# Patient Record
Sex: Female | Born: 1935 | Race: White | Hispanic: No | Smoking: Never smoker
Health system: Southern US, Community
[De-identification: ages and names within clinical notes are randomized; demographics above are authoritative.]

## PROBLEM LIST (undated history)

## (undated) DIAGNOSIS — E785 Hyperlipidemia, unspecified: Secondary | ICD-10-CM

## (undated) DIAGNOSIS — B269 Mumps without complication: Secondary | ICD-10-CM

## (undated) DIAGNOSIS — S72002A Fracture of unspecified part of neck of left femur, initial encounter for closed fracture: Secondary | ICD-10-CM

## (undated) DIAGNOSIS — B019 Varicella without complication: Secondary | ICD-10-CM

## (undated) DIAGNOSIS — M1990 Unspecified osteoarthritis, unspecified site: Secondary | ICD-10-CM

## (undated) DIAGNOSIS — N189 Chronic kidney disease, unspecified: Secondary | ICD-10-CM

## (undated) DIAGNOSIS — B059 Measles without complication: Secondary | ICD-10-CM

## (undated) DIAGNOSIS — M810 Age-related osteoporosis without current pathological fracture: Secondary | ICD-10-CM

## (undated) DIAGNOSIS — I10 Essential (primary) hypertension: Secondary | ICD-10-CM

## (undated) DIAGNOSIS — T7840XA Allergy, unspecified, initial encounter: Secondary | ICD-10-CM

## (undated) DIAGNOSIS — L03213 Periorbital cellulitis: Secondary | ICD-10-CM

## (undated) DIAGNOSIS — E559 Vitamin D deficiency, unspecified: Secondary | ICD-10-CM

## (undated) HISTORY — PX: OTHER SURGICAL HISTORY: SHX169

## (undated) HISTORY — PX: APPENDECTOMY: SHX54

## (undated) HISTORY — DX: Unspecified osteoarthritis, unspecified site: M19.90

## (undated) HISTORY — DX: Hyperlipidemia, unspecified: E78.5

## (undated) HISTORY — DX: Chronic kidney disease, unspecified: N18.9

## (undated) HISTORY — PX: ABDOMINAL HYSTERECTOMY: SHX81

## (undated) HISTORY — DX: Allergy, unspecified, initial encounter: T78.40XA

## (undated) HISTORY — DX: Mumps without complication: B26.9

## (undated) HISTORY — PX: OVARIAN CYST REMOVAL: SHX89

## (undated) HISTORY — DX: Periorbital cellulitis: L03.213

## (undated) HISTORY — DX: Essential (primary) hypertension: I10

## (undated) HISTORY — DX: Vitamin D deficiency, unspecified: E55.9

## (undated) HISTORY — DX: Fracture of unspecified part of neck of left femur, initial encounter for closed fracture: S72.002A

## (undated) HISTORY — DX: Measles without complication: B05.9

## (undated) HISTORY — DX: Age-related osteoporosis without current pathological fracture: M81.0

## (undated) HISTORY — DX: Varicella without complication: B01.9

---

## 1990-03-07 LAB — HM PAP SMEAR: HM Pap smear: NORMAL

## 1999-07-07 LAB — HM MAMMOGRAPHY

## 2004-05-20 ENCOUNTER — Ambulatory Visit: Payer: Self-pay | Admitting: General Surgery

## 2004-05-20 ENCOUNTER — Other Ambulatory Visit: Payer: Self-pay

## 2004-05-25 ENCOUNTER — Ambulatory Visit: Payer: Self-pay | Admitting: General Surgery

## 2004-05-25 HISTORY — PX: OTHER SURGICAL HISTORY: SHX169

## 2004-05-27 ENCOUNTER — Ambulatory Visit: Payer: Self-pay | Admitting: General Surgery

## 2005-01-13 ENCOUNTER — Ambulatory Visit: Payer: Self-pay | Admitting: Family Medicine

## 2011-02-01 DIAGNOSIS — E559 Vitamin D deficiency, unspecified: Secondary | ICD-10-CM | POA: Insufficient documentation

## 2013-01-20 LAB — LIPID PANEL
BUN: 26
Creatine, Serum: 1.2
Potassium, serum: 5.4
Sodium, serum: 137
Vit D, 25-Hydroxy: 43.1

## 2013-07-14 LAB — LIPID PANEL
BUN: 24
Creatine, Serum: 1.11
GLUCOSE: 104
Potassium, serum: 4.7
Sodium, serum: 141
Vit D, 25-Hydroxy: 59.3

## 2014-02-17 ENCOUNTER — Ambulatory Visit: Payer: Self-pay | Admitting: Family Medicine

## 2014-02-17 DIAGNOSIS — M810 Age-related osteoporosis without current pathological fracture: Secondary | ICD-10-CM | POA: Insufficient documentation

## 2014-05-12 LAB — VITAMIN D 25 HYDROXY (VIT D DEFICIENCY, FRACTURES): Vit D, 25-Hydroxy: 25.2

## 2014-09-06 ENCOUNTER — Emergency Department: Admit: 2014-09-06 | Disposition: A | Discharge status: Home / Self Care | Payer: Self-pay | Admitting: Family Medicine

## 2014-09-06 DIAGNOSIS — I10 Essential (primary) hypertension: Secondary | ICD-10-CM | POA: Diagnosis not present

## 2014-09-06 DIAGNOSIS — L03211 Cellulitis of face: Secondary | ICD-10-CM | POA: Diagnosis not present

## 2014-09-06 DIAGNOSIS — L539 Erythematous condition, unspecified: Secondary | ICD-10-CM | POA: Diagnosis not present

## 2014-09-06 DIAGNOSIS — H05012 Cellulitis of left orbit: Secondary | ICD-10-CM | POA: Diagnosis not present

## 2014-09-06 LAB — COMPREHENSIVE METABOLIC PANEL
ALT: 13 U/L — AB
Albumin: 3.7 g/dL
Alkaline Phosphatase: 53 U/L
Anion Gap: 9 (ref 7–16)
BILIRUBIN TOTAL: 1.3 mg/dL — AB
BUN: 24 mg/dL — AB
CHLORIDE: 101 mmol/L
Calcium, Total: 8.8 mg/dL — ABNORMAL LOW
Co2: 24 mmol/L
Creatinine: 1.44 mg/dL — ABNORMAL HIGH
EGFR (African American): 37 — ABNORMAL LOW
GFR CALC NON AF AMER: 32 — AB
Glucose: 128 mg/dL — ABNORMAL HIGH
POTASSIUM: 3.8 mmol/L
SGOT(AST): 22 U/L
SODIUM: 134 mmol/L — AB
TOTAL PROTEIN: 6.8 g/dL

## 2014-09-06 LAB — CBC
HCT: 32.5 % — AB (ref 35.0–47.0)
HGB: 10.9 g/dL — AB (ref 12.0–16.0)
MCH: 33.9 pg (ref 26.0–34.0)
MCHC: 33.6 g/dL (ref 32.0–36.0)
MCV: 101 fL — ABNORMAL HIGH (ref 80–100)
Platelet: 92 10*3/uL — ABNORMAL LOW (ref 150–440)
RBC: 3.21 10*6/uL — AB (ref 3.80–5.20)
RDW: 12.3 % (ref 11.5–14.5)
WBC: 11.5 10*3/uL — ABNORMAL HIGH (ref 3.6–11.0)

## 2014-09-09 DIAGNOSIS — L03211 Cellulitis of face: Secondary | ICD-10-CM | POA: Diagnosis not present

## 2014-09-09 DIAGNOSIS — H578 Other specified disorders of eye and adnexa: Secondary | ICD-10-CM | POA: Diagnosis not present

## 2014-09-10 DIAGNOSIS — H04302 Unspecified dacryocystitis of left lacrimal passage: Secondary | ICD-10-CM | POA: Diagnosis not present

## 2014-09-28 DIAGNOSIS — H04302 Unspecified dacryocystitis of left lacrimal passage: Secondary | ICD-10-CM | POA: Diagnosis not present

## 2014-11-05 ENCOUNTER — Other Ambulatory Visit: Payer: Self-pay | Admitting: *Deleted

## 2014-11-05 MED ORDER — BENAZEPRIL HCL 40 MG PO TABS: 40.0000 mg | ORAL_TABLET | Freq: Every day | ORAL | Status: DC

## 2014-11-05 NOTE — Telephone Encounter (Signed)
Refill request for Benazepril HCL 40mg  1 tab qd Last filled by MD on- 09/09/14 #30 x12 refills Last Appt: 09/09/2014 Next Appt: none Please advise refill?

## 2015-01-04 ENCOUNTER — Encounter: Payer: Self-pay | Admitting: *Deleted

## 2015-01-04 ENCOUNTER — Encounter: Payer: Self-pay | Admitting: Family Medicine

## 2015-01-04 ENCOUNTER — Ambulatory Visit (INDEPENDENT_AMBULATORY_CARE_PROVIDER_SITE_OTHER): Payer: Medicare Other | Admitting: Family Medicine

## 2015-01-04 VITALS — BP 112/62 | HR 140 | Temp 98.3°F | Resp 16 | Wt 115.0 lb

## 2015-01-04 DIAGNOSIS — H5789 Other specified disorders of eye and adnexa: Secondary | ICD-10-CM | POA: Insufficient documentation

## 2015-01-04 DIAGNOSIS — I10 Essential (primary) hypertension: Secondary | ICD-10-CM | POA: Insufficient documentation

## 2015-01-04 DIAGNOSIS — N183 Chronic kidney disease, stage 3 unspecified: Secondary | ICD-10-CM | POA: Insufficient documentation

## 2015-01-04 DIAGNOSIS — L03213 Periorbital cellulitis: Secondary | ICD-10-CM

## 2015-01-04 DIAGNOSIS — I4891 Unspecified atrial fibrillation: Secondary | ICD-10-CM

## 2015-01-04 DIAGNOSIS — J309 Allergic rhinitis, unspecified: Secondary | ICD-10-CM | POA: Insufficient documentation

## 2015-01-04 DIAGNOSIS — K7689 Other specified diseases of liver: Secondary | ICD-10-CM | POA: Insufficient documentation

## 2015-01-04 DIAGNOSIS — E785 Hyperlipidemia, unspecified: Secondary | ICD-10-CM | POA: Insufficient documentation

## 2015-01-04 DIAGNOSIS — M1990 Unspecified osteoarthritis, unspecified site: Secondary | ICD-10-CM | POA: Insufficient documentation

## 2015-01-04 HISTORY — DX: Periorbital cellulitis: L03.213

## 2015-01-04 LAB — LIPID PANEL
BUN: 23
CREATINE, SERUM: 1.31
GLUCOSE: 104
Potassium, serum: 4
Sodium, serum: 137
VIT D 25 HYDROXY: 20.6

## 2015-01-04 NOTE — Progress Notes (Signed)
Patient: Tami [name] Female    DOB: May 25, 1924   81 y.o.   MRN: [id number] Visit Date: 01/04/2015  Today's Provider: Mila Merry, MD   Chief Complaint  Patient presents with  . Hypertension    follow up  . Abnormal Lab    Vitamin D deficiency follow up  . Osteoporosis    follow up   Subjective:    Hypertension Pertinent negatives include no chest pain or shortness of breath.    Hypertension, follow-up:  BP Readings from Last 3 Encounters:  01/04/15 112/62  09/09/14 144/70    She was last seen for hypertension 8 months ago.  BP at that visit was 138/60. Management changes since that visit include  none. She reports good compliance with treatment. She is not having side effects.  She is not exercising. She is adherent to low salt diet.   Outside blood pressures are  Not being checked. She is experiencing none.  Patient denies chest pain, chest pressure/discomfort, claudication, dyspnea, exertional chest pressure/discomfort, fatigue, irregular heart beat, lower extremity edema, near-syncope, orthopnea, palpitations, paroxysmal nocturnal dyspnea, syncope and tachypnea.   Cardiovascular risk factors include advanced age (older than 54 for men, 87 for women) and hypertension.  Use of agents associated with hypertension: NSAIDS.     Weight trend: stable Wt Readings from Last 3 Encounters:  01/04/15 115 lb (52.164 kg)  09/09/14 114 lb (51.71 kg)    Current diet: in general, a "healthy" diet      Osteoporosis Follow up: Last office visit was 8 months ago and not changes were made. Since last visit patient states she stopped taking Fosamax due to it causing worsening leg cramps. Patient states the leg cramps have improved and only occur occasional at night time while in bed.   Vitamin D Deficiency:  Last office visit was 8 months ago. No changes were made. Current treatment includes Vitamin D 50,000 units every week. Patient reports good compliance with  treatment and good tolerance.   ------------------------------------------------------------------------      Allergies  Allergen Reactions  . Maxzide  [Hydrochlorothiazide W-Triamterene]   . Sulfa Antibiotics    Previous Medications   ALENDRONATE (FOSAMAX) 70 MG TABLET    Take by mouth once a week.    AMLODIPINE (NORVASC) 10 MG TABLET    Take by mouth daily.    ASPIRIN 81 MG TABLET    Take 81 mg by mouth daily.    BENAZEPRIL (LOTENSIN) 40 MG TABLET    Take 1 tablet (40 mg total) by mouth daily.   CHOLECALCIFEROL (VITAMIN D3) 50000 UNITS CAPS    Take by mouth once a week.    LORATADINE (CLARITIN) 10 MG TABLET    Take by mouth daily.    RANITIDINE (ZANTAC) 150 MG TABLET    Take by mouth daily.     Review of Systems  Constitutional: Negative for fever, chills and fatigue.  HENT: Negative for ear pain and facial swelling.   Respiratory: Negative for chest tightness, shortness of breath and wheezing.   Cardiovascular: Negative for chest pain and leg swelling.  Musculoskeletal: Positive for myalgias (leg cramps occasionally). Negative for joint swelling.    History  Substance Use Topics  . Smoking status: Never Smoker   . Smokeless tobacco: Not on file  . Alcohol Use: No   Objective:   BP 112/62 mmHg  Pulse 140  Temp(Src) 98.3 F (36.8 C) (Oral)  Resp 16  Wt 115 lb (52.164 kg)  SpO2 97%  Physical Exam  General Appearance:    Alert, cooperative, no distress  Eyes:    PERRL, conjunctiva/corneas clear, EOM's intact       Lungs:     Clear to auscultation bilaterally, respirations unlabored  Heart:    Irregular rate and rhythm, tachycardic  Neurologic:   Awake, alert, oriented x 3. No apparent focal neurological           defect.            Assessment & Plan:     1. Essential hypertension well controlled Continue current medications.   - EKG 12-Lead  2. Atrial fibrillation, unspecified Incidental finding upon presentation today. Asymptomatic. No findings  suggestive of CHF. Currently on ASA. Check labs and echocardiogram. Consider anticoagulant.  - T4 AND TSH - Comprehensive metabolic panel - CBC - Echocardiogram.       Mila Merry, MD  [facility]

## 2015-01-05 LAB — COMPREHENSIVE METABOLIC PANEL
ALT: 32 IU/L (ref 0–32)
AST: 29 IU/L (ref 0–40)
Albumin/Globulin Ratio: 1.7 (ref 1.1–2.5)
Albumin: 4.3 g/dL (ref 3.5–4.7)
Alkaline Phosphatase: 78 IU/L (ref 39–117)
BUN/Creatinine Ratio: 16 (ref 11–26)
BUN: 20 mg/dL (ref 8–27)
Bilirubin Total: 0.3 mg/dL (ref 0.0–1.2)
CO2: 23 mmol/L (ref 18–29)
Calcium: 9.1 mg/dL (ref 8.7–10.3)
Chloride: 103 mmol/L (ref 97–108)
Creatinine, Ser: 1.24 mg/dL — ABNORMAL HIGH (ref 0.57–1.00)
GFR calc non Af Amer: 39 mL/min/{1.73_m2} — ABNORMAL LOW (ref >59)
GFR, EST AFRICAN AMERICAN: 45 mL/min/{1.73_m2} — AB (ref >59)
GLUCOSE: 116 mg/dL — AB (ref 65–99)
Globulin, Total: 2.6 g/dL (ref 1.5–4.5)
Potassium: 5.5 mmol/L — ABNORMAL HIGH (ref 3.5–5.2)
SODIUM: 142 mmol/L (ref 134–144)
Total Protein: 6.9 g/dL (ref 6.0–8.5)

## 2015-01-05 LAB — CBC
Hematocrit: 32.2 % — ABNORMAL LOW (ref 34.0–46.6)
Hemoglobin: 10.8 g/dL — ABNORMAL LOW (ref 11.1–15.9)
MCH: 34.1 pg — ABNORMAL HIGH (ref 26.6–33.0)
MCHC: 33.5 g/dL (ref 31.5–35.7)
MCV: 102 fL — AB (ref 79–97)
Platelets: 155 10*3/uL (ref 150–379)
RBC: 3.17 x10E6/uL — ABNORMAL LOW (ref 3.77–5.28)
RDW: 13 % (ref 12.3–15.4)
WBC: 6.3 10*3/uL (ref 3.4–10.8)

## 2015-01-05 LAB — T4 AND TSH
T4, Total: 6.2 ug/dL (ref 4.5–12.0)
TSH: 8.88 u[IU]/mL — AB (ref 0.450–4.500)

## 2015-01-06 ENCOUNTER — Telehealth: Payer: Self-pay | Admitting: Family Medicine

## 2015-01-06 ENCOUNTER — Encounter: Payer: Self-pay | Admitting: Family Medicine

## 2015-01-06 DIAGNOSIS — I4891 Unspecified atrial fibrillation: Secondary | ICD-10-CM | POA: Insufficient documentation

## 2015-01-06 DIAGNOSIS — D539 Nutritional anemia, unspecified: Secondary | ICD-10-CM | POA: Insufficient documentation

## 2015-01-06 LAB — SPECIMEN STATUS REPORT

## 2015-01-06 LAB — B12 AND FOLATE PANEL
FOLATE: 18 ng/mL (ref >3.0)
VITAMIN B 12: 226 pg/mL (ref 211–946)

## 2015-01-06 LAB — FERRITIN: Ferritin: 129 ng/mL (ref 15–150)

## 2015-01-06 NOTE — Telephone Encounter (Signed)
I don't know how to order an echo in Epic. I have to fill in question about whether to use enhancing agent and i have know idea what that means for an echocardiogram. Can you check with cardiopulmonary to see how it is supposed to be ordered.

## 2015-01-06 NOTE — Telephone Encounter (Signed)
Per Dorene Sorrow echo tech at [facility] you should answer yes to using enhancing agent

## 2015-01-06 NOTE — Telephone Encounter (Signed)
You had sent a message to order an echo for pt.I am unable to order any test without referral put in Epic

## 2015-01-06 NOTE — Telephone Encounter (Signed)
Please schedule echo. Order is in Epic.

## 2015-01-07 ENCOUNTER — Other Ambulatory Visit: Payer: Self-pay | Admitting: Family Medicine

## 2015-01-07 ENCOUNTER — Telehealth: Payer: Self-pay | Admitting: Family Medicine

## 2015-01-07 DIAGNOSIS — I4891 Unspecified atrial fibrillation: Secondary | ICD-10-CM

## 2015-01-07 NOTE — Telephone Encounter (Signed)
Auth # [id number] expires 02-21-15 for 2 D echo to be done at [facility]

## 2015-01-07 NOTE — Telephone Encounter (Signed)
That's the one I ordered. I'll order it again.

## 2015-01-07 NOTE — Telephone Encounter (Signed)
The echo you ordered is not for the 2D echo.It should read echo complete or [id number]

## 2015-01-08 ENCOUNTER — Encounter: Payer: Self-pay | Admitting: Family Medicine

## 2015-01-12 ENCOUNTER — Telehealth: Payer: Self-pay

## 2015-01-12 ENCOUNTER — Ambulatory Visit
Admission: RE | Admit: 2015-01-12 | Discharge: 2015-01-12 | Disposition: A | Discharge status: Home / Self Care | Payer: Medicare Other | Source: Ambulatory Visit | Attending: Family Medicine | Admitting: Family Medicine

## 2015-01-12 DIAGNOSIS — I340 Nonrheumatic mitral (valve) insufficiency: Secondary | ICD-10-CM | POA: Diagnosis not present

## 2015-01-12 DIAGNOSIS — I351 Nonrheumatic aortic (valve) insufficiency: Secondary | ICD-10-CM | POA: Diagnosis not present

## 2015-01-12 DIAGNOSIS — I4891 Unspecified atrial fibrillation: Secondary | ICD-10-CM | POA: Insufficient documentation

## 2015-01-12 DIAGNOSIS — I071 Rheumatic tricuspid insufficiency: Secondary | ICD-10-CM | POA: Diagnosis not present

## 2015-01-12 NOTE — Telephone Encounter (Signed)
-----   Message from Malva Limes, MD sent at 01/12/2015  1:56 PM EDT ----- Echocardiogram shows that her heart rhythm is still irregular (atrial fibrillation), but is functioning well otherwise. She needs to schedule o.v. In the next 7-10 days to discuss medications to control her heart rhythm.

## 2015-01-12 NOTE — Telephone Encounter (Signed)
Advised pt's daugther, Margrett Rud, as described below, she verbalized fully understanding; the phone call was transferred to the front desk to make a f/u appointment for the next 7-10 days.

## 2015-01-12 NOTE — Progress Notes (Signed)
*  PRELIMINARY RESULTS* Echocardiogram 2D Echocardiogram has been performed.  Georgann Housekeeper Hege 01/12/2015, 11:20 AM

## 2015-01-20 ENCOUNTER — Ambulatory Visit (INDEPENDENT_AMBULATORY_CARE_PROVIDER_SITE_OTHER): Payer: Medicare Other | Admitting: Family Medicine

## 2015-01-20 ENCOUNTER — Encounter: Payer: Self-pay | Admitting: Family Medicine

## 2015-01-20 VITALS — BP 118/64 | HR 108 | Temp 98.1°F | Resp 18 | Wt 113.0 lb

## 2015-01-20 DIAGNOSIS — N183 Chronic kidney disease, stage 3 unspecified: Secondary | ICD-10-CM

## 2015-01-20 DIAGNOSIS — E559 Vitamin D deficiency, unspecified: Secondary | ICD-10-CM

## 2015-01-20 DIAGNOSIS — I482 Chronic atrial fibrillation, unspecified: Secondary | ICD-10-CM

## 2015-01-20 MED ORDER — RIVAROXABAN 15 MG PO TABS: 15.0000 mg | ORAL_TABLET | Freq: Every day | ORAL | Status: DC

## 2015-01-20 MED ORDER — VERAPAMIL HCL ER 120 MG PO TBCR: 120.0000 mg | EXTENDED_RELEASE_TABLET | Freq: Every day | ORAL | Status: DC

## 2015-01-20 MED ORDER — VITAMIN D3 50 MCG (2000 UT) PO CHEW: 1.0000 | CHEWABLE_TABLET | Freq: Every day | ORAL | Status: AC

## 2015-01-20 NOTE — Progress Notes (Signed)
Patient: Tami [name] Female    DOB: 1923/11/27   81 y.o.   MRN: [id number] Visit Date: 01/20/2015  Today's Provider: Mila Merry, MD   Chief Complaint  Patient presents with  . Follow-up  . Results    2D echo   Subjective:    HPI  Patient was found incidentally to be in a-fib at routine visit 01-04-15. Follow-up for results to 2D echo from 01/12/2015. Echocardiogram shows that her heart rhythm is still irregular (atrial fibrillation), with normal systolic function and mild to moderate  AR/TR/MR. She does complain of heart racing with palpitations at night, but is feeling well otherwise.     Allergies  Allergen Reactions  . Maxzide  [Hydrochlorothiazide W-Triamterene]   . Sulfa Antibiotics    Previous Medications   ALENDRONATE (FOSAMAX) 70 MG TABLET    Take by mouth once a week.    AMLODIPINE (NORVASC) 10 MG TABLET    Take by mouth daily.    ASPIRIN 81 MG TABLET    Take 81 mg by mouth daily.    BENAZEPRIL (LOTENSIN) 40 MG TABLET    Take 1 tablet (40 mg total) by mouth daily.   CHOLECALCIFEROL (VITAMIN D3) 50000 UNITS CAPS    Take by mouth once a week.    LORATADINE (CLARITIN) 10 MG TABLET    Take by mouth daily.    MULTIPLE VITAMINS-MINERALS (PRESERVISION AREDS 2) CAPS    Take 1 capsule by mouth 2 (two) times daily.   RANITIDINE (ZANTAC) 150 MG TABLET    Take by mouth daily.     Review of Systems  Respiratory: Negative for shortness of breath.   Cardiovascular: Positive for palpitations. Negative for chest pain.  Neurological: Negative for dizziness, light-headedness and headaches.  Psychiatric/Behavioral: Negative for sleep disturbance.    Social History  Substance Use Topics  . Smoking status: Never Smoker   . Smokeless tobacco: Not on file  . Alcohol Use: No   Objective:   BP 118/64 mmHg  Pulse 108  Temp(Src) 98.1 F (36.7 C) (Oral)  Resp 18  Wt 113 lb (51.256 kg)  SpO2 92%  Physical Exam  General Appearance:    Alert, cooperative, no distress    Eyes:    PERRL, conjunctiva/corneas clear, EOM's intact       Lungs:     Clear to auscultation bilaterally, respirations unlabored  Heart:    Irregularly irregular rhythm  Neurologic:   Awake, alert, oriented x 3. No apparent focal neurological           defect.            Assessment & Plan:     1. Chronic atrial fibrillation New onset. Counseled on risk/benefit of anticoagulation and options of warfarin versus novel anticoagulants. Will stop ASA and start Xarelto. - Rivaroxaban (XARELTO) 15 MG TABS tablet; Take 1 tablet (15 mg total) by mouth daily with supper.  Dispense: 30 tablet; Refill: 3  Will stop amlodipine for BP in favor of verapamil to control heart rate.  - verapamil (CALAN-SR) 120 MG CR tablet; Take 1 tablet (120 mg total) by mouth daily.  Dispense: 30 tablet; Refill: 3  2. Vitamin D deficiency She would like to take lower daily dose of D3 instead of high weekly dose. Will recheck levels after a few months.  - Cholecalciferol (VITAMIN D3) 2000 UNITS 1 tablet by mouth daily.    3. Chronic kidney disease, stage 3 Stable.  Lelon Huh, MD  [facility]

## 2015-01-20 NOTE — Patient Instructions (Signed)
Stop amlodipine and start taking Verapamil instead. This should help regular heart beat Stop aspirin and start taking Xarelto15mg  a day to prevent blood clots in heart.

## 2015-02-10 ENCOUNTER — Ambulatory Visit (INDEPENDENT_AMBULATORY_CARE_PROVIDER_SITE_OTHER): Payer: Medicare Other | Admitting: Family Medicine

## 2015-02-10 ENCOUNTER — Encounter: Payer: Self-pay | Admitting: Family Medicine

## 2015-02-10 VITALS — BP 120/80 | HR 110 | Temp 98.2°F | Resp 16 | Wt 110.0 lb

## 2015-02-10 DIAGNOSIS — I482 Chronic atrial fibrillation, unspecified: Secondary | ICD-10-CM

## 2015-02-10 DIAGNOSIS — Z23 Encounter for immunization: Secondary | ICD-10-CM

## 2015-02-10 NOTE — Progress Notes (Signed)
Patient: Tami [name] Female    DOB: 1924/01/23   81 y.o.   MRN: [id number] Visit Date: 02/10/2015  Today's Provider: Mila Merry, MD   Chief Complaint  Patient presents with  . Hypertension    follow up  . Atrial Fibrillation    follow up   Subjective:    HPI  Hypertension, follow-up:  BP Readings from Last 3 Encounters:  01/20/15 118/64  01/04/15 112/62  09/09/14 144/70    She was last seen for hypertension 3 weeks ago.  BP at that visit was  118/64. Management since that visit includes  Stopping  Amlodipine for blood pressure in favor of Verapamil to control heart rate. She reports good compliance with treatment. She is not having side effects.  She is exercising. She is  adherent to low salt diet.   Outside blood pressures are not being checked. She is experiencing none.  Patient denies chest pain, chest pressure/discomfort, claudication, dyspnea, exertional chest pressure/discomfort, fatigue, irregular heart beat, lower extremity edema, near-syncope, orthopnea, palpitations, paroxysmal nocturnal dyspnea, syncope and tachypnea.   Cardiovascular risk factors include advanced age (older than 59 for men, 34 for women) and hypertension.  Use of agents associated with hypertension: none.     Weight trend: stable Wt Readings from Last 3 Encounters:  01/20/15 113 lb (51.256 kg)  01/04/15 115 lb (52.164 kg)  09/09/14 114 lb (51.71 kg)    Current diet: in general, a "healthy" diet    ------------------------------------------------------------------------  Follow up Atrial Fibrillation: Last office visit was 3 weeks ago. Changes made include stopping Aspirin and starting Xarelto. Patient reports good compliance and good tolerance with treatment.     Allergies  Allergen Reactions  . Maxzide  [Hydrochlorothiazide W-Triamterene]   . Sulfa Antibiotics    Previous Medications   ALENDRONATE (FOSAMAX) 70 MG TABLET    Take by mouth once a week.    BENAZEPRIL (LOTENSIN) 40 MG TABLET    Take 1 tablet (40 mg total) by mouth daily.   CHOLECALCIFEROL (VITAMIN D3) 2000 UNITS CHEW    Chew 1 tablet by mouth daily.   LORATADINE (CLARITIN) 10 MG TABLET    Take by mouth daily.    MULTIPLE VITAMINS-MINERALS (PRESERVISION AREDS 2) CAPS    Take 1 capsule by mouth 2 (two) times daily.   RANITIDINE (ZANTAC) 150 MG TABLET    Take by mouth daily.    RIVAROXABAN (XARELTO) 15 MG TABS TABLET    Take 1 tablet (15 mg total) by mouth daily with supper.   VERAPAMIL (CALAN-SR) 120 MG CR TABLET    Take 1 tablet (120 mg total) by mouth daily.    Review of Systems  Constitutional: Negative for fever, chills, appetite change and fatigue.  Respiratory: Negative for chest tightness and shortness of breath.   Cardiovascular: Negative for chest pain and palpitations.  Gastrointestinal: Negative for nausea, vomiting and abdominal pain.  Neurological: Negative for dizziness and weakness.    Social History  Substance Use Topics  . Smoking status: Never Smoker   . Smokeless tobacco: Not on file  . Alcohol Use: No   Objective:   BP 120/80 mmHg  Pulse 110  Temp(Src) 98.2 F (36.8 C) (Oral)  Resp 16  Wt 110 lb (49.896 kg)  SpO2 98%  Physical Exam  General Appearance:    Alert, cooperative, no distress, obese  Eyes:    PERRL, conjunctiva/corneas clear, EOM's intact       Lungs:  Clear to auscultation bilaterally, respirations unlabored  Heart:    Rhythm: irregularly irregular and rapid rate Murmur(s)-  none  Neurologic:   Awake, alert, oriented x 3. No apparent focal neurological           defect.            Assessment & Plan:     1. Chronic atrial fibrillation Doing well on verapamil with fewer palpitations and heart rate nearly down to normal. She just has medication refilled. Will continue 120 for now and recheck next month. Consider increasing verapamil and reducing benazepril if HR still over 100 at follow up.   2. Need for influenza  vaccination  - Flu vaccine HIGH DOSE PF       Mila Merry, MD  [facility]

## 2015-03-01 ENCOUNTER — Encounter: Payer: Self-pay | Admitting: Family Medicine

## 2015-03-24 ENCOUNTER — Encounter: Payer: Self-pay | Admitting: Family Medicine

## 2015-03-24 ENCOUNTER — Other Ambulatory Visit: Payer: Self-pay | Admitting: Family Medicine

## 2015-03-24 ENCOUNTER — Ambulatory Visit (INDEPENDENT_AMBULATORY_CARE_PROVIDER_SITE_OTHER): Payer: Medicare Other | Admitting: Family Medicine

## 2015-03-24 VITALS — BP 120/80 | HR 116 | Temp 98.1°F | Resp 16 | Ht 62.0 in | Wt 107.0 lb

## 2015-03-24 DIAGNOSIS — I482 Chronic atrial fibrillation, unspecified: Secondary | ICD-10-CM

## 2015-03-24 DIAGNOSIS — I10 Essential (primary) hypertension: Secondary | ICD-10-CM

## 2015-03-24 DIAGNOSIS — R000 Tachycardia, unspecified: Secondary | ICD-10-CM

## 2015-03-24 MED ORDER — BENAZEPRIL HCL 20 MG PO TABS: 40.0000 mg | ORAL_TABLET | Freq: Every day | ORAL | Status: DC

## 2015-03-24 MED ORDER — VERAPAMIL HCL ER 180 MG PO TBCR: 180.0000 mg | EXTENDED_RELEASE_TABLET | Freq: Every day | ORAL | Status: DC

## 2015-03-24 NOTE — Progress Notes (Addendum)
Patient: Tami [name] Female    DOB: Mar 28, 1924   81 y.o.   MRN: [id number] Visit Date: 03/24/2015  Today's Provider: Mila Merryonald Ranisha Allaire, MD   Chief Complaint  Patient presents with  . Atrial Fibrillation    follow up  . Memory Loss   Subjective:    Atrial Fibrillation Presents for follow-up visit. Symptoms are negative for chest pain, dizziness, palpitations, shortness of breath, syncope, tachycardia and weakness. Past medical history includes atrial fibrillation.   Last office visit was 02/10/2015. No changes were made. Patient was advised to continue Verapamil 120mg . Patient was to follow up in 1 month. Would consider increasing Verapamil and reducing Benazepril if heart rate was still over 100 at follow up visit.   Memory Impairment: Patients daughter comes in today with patient reporting that over the past several years she has noticed that patients memory is worsening. Patient is forgetting how to do simple task that she has done for several years like making instant potatoes. She also has repetitive behavior where she  Picks up an item, then places it back down and repeats the cycle over and over.     Allergies  Allergen Reactions  . Maxzide  [Hydrochlorothiazide W-Triamterene]   . Sulfa Antibiotics    Previous Medications   ALENDRONATE (FOSAMAX) 70 MG TABLET    Take by mouth once a week.    BENAZEPRIL (LOTENSIN) 40 MG TABLET    Take 1 tablet (40 mg total) by mouth daily.   CHOLECALCIFEROL (VITAMIN D3) 2000 UNITS CHEW    Chew 1 tablet by mouth daily.   DOCUSATE SODIUM (COLACE) 100 MG CAPSULE    Take 100 mg by mouth daily.   LORATADINE (CLARITIN) 10 MG TABLET    Take by mouth daily.    MULTIPLE VITAMINS-MINERALS (PRESERVISION AREDS 2) CAPS    Take 1 capsule by mouth 2 (two) times daily.   RANITIDINE (ZANTAC) 150 MG TABLET    Take by mouth daily.    RIVAROXABAN (XARELTO) 15 MG TABS TABLET    Take 1 tablet (15 mg total) by mouth daily with supper.   VERAPAMIL (CALAN-SR)  120 MG CR TABLET    Take 1 tablet (120 mg total) by mouth daily.    Review of Systems  Constitutional: Negative for fever, chills, appetite change and fatigue.  Respiratory: Negative for chest tightness and shortness of breath.   Cardiovascular: Negative for chest pain, palpitations and syncope.  Gastrointestinal: Negative for nausea, vomiting and abdominal pain.  Neurological: Negative for dizziness and weakness.  Psychiatric/Behavioral: Positive for confusion (forgetful ). Negative for suicidal ideas, hallucinations, behavioral problems, sleep disturbance, self-injury, dysphoric mood and agitation. The patient is nervous/anxious. The patient is not hyperactive.     Social History  Substance Use Topics  . Smoking status: Never Smoker   . Smokeless tobacco: Not on file  . Alcohol Use: No   Objective:   BP 120/80 mmHg  Pulse 116  Temp(Src) 98.1 F (36.7 C) (Oral)  Resp 16  Ht 5\' 2"  (1.575 m)  Wt 107 lb (48.535 kg)  BMI 19.57 kg/m2  SpO2 98%  Physical Exam   General Appearance:    Alert, cooperative, no distress  Eyes:    PERRL, conjunctiva/corneas clear, EOM's intact       Lungs:     Clear to auscultation bilaterally, respirations unlabored  Heart:    Tachycardic, irregularly irregular  Neurologic:   Awake, alert, oriented x 3. No apparent focal neurological  defect.      Cognitive Testing - 6-CIT  Correct? Score   What year is it? yes 0 0 or 4  What month is it? yes 0 0 or 3  Memorize:    Floyde Parkins,  44,  [address],  [place],      What time is it? (within 1 hour) yes 0 0 or 3  Count backwards from 20 yes 0 0, 2, or 4  Name the months of the year yes 0 0, 2, or 4  Repeat name & address above no 3 0, 2, 4, 6, 8, or 10       TOTAL SCORE  3/28   Interpretation:  Normal  Normal (0-7) Abnormal (8-28)        Assessment & Plan:     1. Chronic atrial fibrillation (HCC) Rate still uncontrolled. Continue Xarelta and increase verapamil - verapamil (CALAN-SR)  180 MG CR tablet; Take 1 tablet (180 mg total) by mouth daily.  Dispense: 30 tablet; Refill: 2  2. Essential hypertension Reduce benazepril to  since we are increasing verapamil  3. Tachycardia Increase verapamil from 120 to 180 as above.    Return in about 2 months (around 05/24/2015).        Mila Merry, MD  [facility]

## 2015-04-09 ENCOUNTER — Telehealth: Payer: Self-pay | Admitting: Family Medicine

## 2015-04-09 MED ORDER — BENAZEPRIL HCL 20 MG PO TABS: 20.0000 mg | ORAL_TABLET | Freq: Every day | ORAL | Status: DC

## 2015-04-09 NOTE — Telephone Encounter (Signed)
[name] was notified. Expressed understanding.

## 2015-04-09 NOTE — Telephone Encounter (Signed)
She is right, only supposed to be one tablet a day. I think the computer automatically changed it to 2 when I changed from the 40mg  tablet to a 20mg  tablet. Please call pharmacy and have them change the directions for the next refill. Thanks.

## 2015-04-09 NOTE — Telephone Encounter (Signed)
Pt daughter, Okey DupreRose called to verify the dosage for Rx benazepril (LOTENSIN) 20 MG tablet.  Pt daughter states she thought pt should only take 1 a day but Rx states 2 day.  JW#[phone]/AOCB#[id number]/MW

## 2015-04-09 NOTE — Telephone Encounter (Signed)
Please advise 

## 2015-04-30 ENCOUNTER — Other Ambulatory Visit: Payer: Self-pay | Admitting: Family Medicine

## 2015-05-24 ENCOUNTER — Encounter: Payer: Self-pay | Admitting: Family Medicine

## 2015-05-24 ENCOUNTER — Ambulatory Visit (INDEPENDENT_AMBULATORY_CARE_PROVIDER_SITE_OTHER): Payer: Medicare Other | Admitting: Family Medicine

## 2015-05-24 VITALS — BP 124/74 | HR 126 | Temp 97.9°F | Resp 16 | Wt 111.0 lb

## 2015-05-24 DIAGNOSIS — I10 Essential (primary) hypertension: Secondary | ICD-10-CM

## 2015-05-24 DIAGNOSIS — I482 Chronic atrial fibrillation, unspecified: Secondary | ICD-10-CM

## 2015-05-24 DIAGNOSIS — R000 Tachycardia, unspecified: Secondary | ICD-10-CM

## 2015-05-24 MED ORDER — BENAZEPRIL HCL 10 MG PO TABS: 10.0000 mg | ORAL_TABLET | Freq: Every day | ORAL | Status: DC

## 2015-05-24 MED ORDER — VERAPAMIL HCL ER 240 MG PO TBCR: 240.0000 mg | EXTENDED_RELEASE_TABLET | Freq: Every day | ORAL | Status: DC

## 2015-05-24 NOTE — Progress Notes (Signed)
Patient: Tami [name] Female    DOB: 01/06/1924   81 y.o.   MRN: [id number] Visit Date: 05/24/2015  Today's Provider: Mila Merry, MD   Chief Complaint  Patient presents with  . Follow-up   Subjective:    HPI  Follow-up for A-Fib and tachycardia from 03/24/2015; well controlled, increased verapamil from 120 mg to 180 mg qd.    Hypertension, follow-up:  BP Readings from Last 3 Encounters:  05/24/15 124/74  03/24/15 120/80  02/10/15 120/80    She was last seen for hypertension 2 months ago.  BP at that visit was 120/80. Management since that visit includes; reduced benazepril 40 mg  to 20 mg qd. She reports good compliance with treatment. She is not having side effects. none  She is exercising. She is adherent to low salt diet.   Outside blood pressures are n/a. She is experiencing none.  Patient denies palpitations.   Cardiovascular risk factors include hypertension.  Use of agents associated with hypertension: none.     Weight trend: stable Wt Readings from Last 3 Encounters:  05/24/15 111 lb (50.349 kg)  03/24/15 107 lb (48.535 kg)  02/10/15 110 lb (49.896 kg)    Current diet: well balanced  -----------------------------------------------------------------------    Allergies  Allergen Reactions  . Maxzide  [Hydrochlorothiazide W-Triamterene]   . Sulfa Antibiotics    Previous Medications   ALENDRONATE (FOSAMAX) 70 MG TABLET    Take by mouth once a week.    BENAZEPRIL (LOTENSIN) 20 MG TABLET    Take 1 tablet (20 mg total) by mouth daily. Please cancel refills for the  tablet   CHOLECALCIFEROL (VITAMIN D3) 2000 UNITS CHEW    Chew 1 tablet by mouth daily.   DOCUSATE SODIUM (COLACE) 100 MG CAPSULE    Take 100 mg by mouth daily.   LORATADINE (CLARITIN) 10 MG TABLET    Take by mouth daily.    MULTIPLE VITAMINS-MINERALS (PRESERVISION AREDS 2) CAPS    Take 1 capsule by mouth 2 (two) times daily.   RANITIDINE (ZANTAC) 150 MG TABLET    Take by  mouth daily.    VERAPAMIL (CALAN-SR) 180 MG CR TABLET    Take 1 tablet (180 mg total) by mouth daily.   XARELTO 15 MG TABS TABLET    TAKE ONE (1) TABLET BY MOUTH EVERY DAY WITH SUPPER    Review of Systems  Constitutional: Negative for fever, chills, appetite change and fatigue.  Respiratory: Negative for chest tightness and shortness of breath.   Cardiovascular: Positive for palpitations. Negative for chest pain.  Gastrointestinal: Negative for nausea, vomiting and abdominal pain.  Neurological: Negative for dizziness, weakness and light-headedness.    Social History  Substance Use Topics  . Smoking status: Never Smoker   . Smokeless tobacco: Not on file  . Alcohol Use: No   Objective:   BP 124/74 mmHg  Pulse 126  Temp(Src) 97.9 F (36.6 C) (Oral)  Resp 16  Wt 111 lb (50.349 kg)  SpO2 94%  Physical Exam   General Appearance:    Alert, cooperative, no distress  Eyes:    PERRL, conjunctiva/corneas clear, EOM's intact       Lungs:     Clear to auscultation bilaterally, respirations unlabored  Heart:    Irregularly irregular and tachycardic  Neurologic:   Awake, alert, oriented x 3. No apparent focal neurological           defect.  Assessment & Plan:     1. Chronic atrial fibrillation (HCC) Increase from 180 to 240 of verapamil as rate is not  Controlled.  - verapamil (CALAN-SR) 240 MG CR tablet; Take 1 tablet (240 mg total) by mouth daily.  Dispense: 30 tablet; Refill: 2  2. Tachycardia As above.   3. Essential hypertension Reduce benazepril to 10mg  daily due to increase in verapamil  Follow up in 4-5 weeks.        Mila Merryonald Fisher, MD  [facility]

## 2015-06-08 ENCOUNTER — Telehealth: Payer: Self-pay | Admitting: Family Medicine

## 2015-06-08 NOTE — Telephone Encounter (Signed)
Needs refills on Benazepril 10 mg.   Called to [facility]

## 2015-06-09 MED ORDER — BENAZEPRIL HCL 10 MG PO TABS: 10.0000 mg | ORAL_TABLET | Freq: Every day | ORAL | Status: DC

## 2015-06-28 ENCOUNTER — Encounter: Payer: Self-pay | Admitting: Family Medicine

## 2015-06-28 ENCOUNTER — Ambulatory Visit (INDEPENDENT_AMBULATORY_CARE_PROVIDER_SITE_OTHER): Payer: Medicare Other | Admitting: Family Medicine

## 2015-06-28 VITALS — BP 128/68 | HR 86 | Temp 98.1°F | Resp 16 | Ht 62.0 in | Wt 115.0 lb

## 2015-06-28 DIAGNOSIS — I10 Essential (primary) hypertension: Secondary | ICD-10-CM

## 2015-06-28 DIAGNOSIS — I4891 Unspecified atrial fibrillation: Secondary | ICD-10-CM | POA: Diagnosis not present

## 2015-06-28 NOTE — Progress Notes (Signed)
Patient: Tami [name] Female    DOB: 1924-05-19   82 y.o.   MRN: [id number] Visit Date: 06/28/2015  Today's Provider: Mila Merry, MD   Chief Complaint  Patient presents with  . Hypertension    follow up  . Atrial Fibrillation    follow up   Subjective:    HPI   Hypertension, follow-up:  BP Readings from Last 3 Encounters:  06/28/15 128/68  05/24/15 124/74  03/24/15 120/80    She was last seen for hypertension 1 months ago.  BP at that visit was 124/74. Management since that visit includes decreasing Benazepril to  daily. Patient reports that she has been cutting the  tablet in half.  She reports good compliance with treatment. She is not having side effects.  Outside blood pressures are not being checked. She is experiencing palpitations. Patient reports that her symptoms are worse at night.       Weight trend: stable Wt Readings from Last 3 Encounters:  06/28/15 115 lb (52.164 kg)  05/24/15 111 lb (50.349 kg)  03/24/15 107 lb (48.535 kg)    Current diet: well balanced  ------------------------------------------------------------------------  Follow up Atrial Fibrillation: Since last visit, patient was advised to increase Verapamil from  to . Patient reports that she has been tolerating med change well.      Allergies  Allergen Reactions  . Maxzide  [Hydrochlorothiazide W-Triamterene]   . Sulfa Antibiotics    Previous Medications   ALENDRONATE (FOSAMAX) 70 MG TABLET    Take by mouth once a week.    BENAZEPRIL (LOTENSIN) 10 MG TABLET    Take 1 tablet (10 mg total) by mouth daily.   CHOLECALCIFEROL (VITAMIN D3) 2000 UNITS CHEW    Chew 1 tablet by mouth daily.   DOCUSATE SODIUM (COLACE) 100 MG CAPSULE    Take 100 mg by mouth daily.   LORATADINE (CLARITIN) 10 MG TABLET    Take by mouth daily.    MULTIPLE VITAMINS-MINERALS (PRESERVISION AREDS 2) CAPS    Take 1 capsule by mouth 2 (two) times daily.   RANITIDINE (ZANTAC) 150 MG  TABLET    Take by mouth daily.    VERAPAMIL (CALAN-SR) 240 MG CR TABLET    Take 1 tablet (240 mg total) by mouth daily.   XARELTO 15 MG TABS TABLET    TAKE ONE (1) TABLET BY MOUTH EVERY DAY WITH SUPPER    Review of Systems  Constitutional: Negative.   Respiratory: Negative.   Cardiovascular: Positive for palpitations.  Musculoskeletal: Negative.   Neurological: Negative.     Social History  Substance Use Topics  . Smoking status: Never Smoker   . Smokeless tobacco: Not on file  . Alcohol Use: No   Objective:   BP 128/68 mmHg  Pulse 86  Temp(Src) 98.1 F (36.7 C)  Resp 16  Ht  (1.575 m)  Wt 115 lb (52.164 kg)  BMI 21.03 kg/m2  SpO2 97%  Physical Exam   General Appearance:    Alert, cooperative, no distress  Eyes:    PERRL, conjunctiva/corneas clear, EOM's intact       Lungs:     Clear to auscultation bilaterally, respirations unlabored  Heart:    Irregularly irregular, rate and rhythm. No edema  Neurologic:   Awake, alert, oriented x 3. No apparent focal neurological           defect.        EKG: A-fib: VR=76 bpm  Assessment & Plan:     1. Atrial fibrillation, unspecified type (HCC) Asymptomatic, rate is well controlled. On xarelto. Continue current medications.  - EKG 12-Lead  2. Essential hypertension Well controlled.  Continue current medications.    Return in about 4 months (around 10/26/2015).       Mila Merry, MD  [facility]

## 2015-07-06 ENCOUNTER — Other Ambulatory Visit: Payer: Self-pay | Admitting: Family Medicine

## 2015-08-03 ENCOUNTER — Other Ambulatory Visit: Payer: Self-pay | Admitting: Family Medicine

## 2015-08-03 ENCOUNTER — Telehealth: Payer: Self-pay | Admitting: Family Medicine

## 2015-08-03 MED ORDER — BENAZEPRIL HCL 10 MG PO TABS: 10.0000 mg | ORAL_TABLET | Freq: Every day | ORAL | Status: DC

## 2015-08-03 NOTE — Telephone Encounter (Signed)
Please advise 

## 2015-08-03 NOTE — Telephone Encounter (Signed)
Pt daughter Okey Dupre is requesting a refill for benazepril (LOTENSIN) 10 MG tablet to [facility]. [name] stated that Dr. Sherrie Mustache changed the dose to 10 mg. Thanks TNP

## 2015-08-31 ENCOUNTER — Other Ambulatory Visit: Payer: Self-pay | Admitting: Family Medicine

## 2015-10-25 ENCOUNTER — Encounter: Payer: Self-pay | Admitting: Family Medicine

## 2015-10-25 ENCOUNTER — Ambulatory Visit (INDEPENDENT_AMBULATORY_CARE_PROVIDER_SITE_OTHER): Payer: Medicare Other | Admitting: Family Medicine

## 2015-10-25 VITALS — BP 128/72 | HR 88 | Temp 98.1°F | Resp 16 | Ht 62.0 in | Wt 116.0 lb

## 2015-10-25 DIAGNOSIS — I10 Essential (primary) hypertension: Secondary | ICD-10-CM | POA: Diagnosis not present

## 2015-10-25 DIAGNOSIS — I4891 Unspecified atrial fibrillation: Secondary | ICD-10-CM

## 2015-10-25 MED ORDER — VERAPAMIL HCL ER 240 MG PO TBCR: EXTENDED_RELEASE_TABLET | ORAL | Status: DC

## 2015-10-25 NOTE — Progress Notes (Signed)
Patient ID: Tami [name], female   DOB: 1923/10/15, 82 y.o.   MRN: [id number]       Patient: Tami [name] Female    DOB: 1923/07/12   82 y.o.   MRN: [id number] Visit Date: 10/25/2015  Today's Provider: Mila Merry, MD   Chief Complaint  Patient presents with  . Hypertension    4 month F/U.   Marland Kitchen Atrial Fibrillation   Subjective:    HPI  Hypertension, follow-up:  BP Readings from Last 3 Encounters:  10/25/15 128/72  06/28/15 128/68  05/24/15 124/74    She was last seen for hypertension 4 months ago.  BP at that visit was 128/68. Management since that visit includes no changes. She reports good compliance with treatment. She is not having side effects.  She is not exercising. She is adherent to low salt diet.   Outside blood pressures are not being checked. Patient denies chest pain.    Weight trend: stable Wt Readings from Last 3 Encounters:  10/25/15 116 lb (52.617 kg)  06/28/15 115 lb (52.164 kg)  05/24/15 111 lb (50.349 kg)    Current diet: well balanced    Atrial fibrillation Is doing well with current combination of verapamil, benazepril, and Xarelto. Having no abnormal bleeding or bruising. No unusual dyspnea, no chest pains or palpitations.   BMET    Component Value Date/Time   NA 142 01/04/2015 1208   NA 134* 09/06/2014 1226   K 5.5* 01/04/2015 1208   K 3.8 09/06/2014 1226   CL 103 01/04/2015 1208   CL 101 09/06/2014 1226   CO2 23 01/04/2015 1208   CO2 24 09/06/2014 1226   GLUCOSE 116* 01/04/2015 1208   GLUCOSE 128* 09/06/2014 1226   BUN 20 01/04/2015 1208   BUN 24* 09/06/2014 1226   CREATININE 1.24* 01/04/2015 1208   CREATININE 1.44* 09/06/2014 1226   CALCIUM 9.1 01/04/2015 1208   CALCIUM 8.8* 09/06/2014 1226   GFRNONAA 39* 01/04/2015 1208   GFRNONAA 32* 09/06/2014 1226   GFRAA 45* 01/04/2015 1208   GFRAA 37* 09/06/2014 1226      Allergies  Allergen Reactions  . Maxzide  [Hydrochlorothiazide W-Triamterene]   . Sulfa  Antibiotics    Previous Medications   ALENDRONATE (FOSAMAX) 70 MG TABLET    Take by mouth once a week.    BENAZEPRIL (LOTENSIN) 10 MG TABLET    Take 1 tablet (10 mg total) by mouth daily.   CHOLECALCIFEROL (VITAMIN D3) 2000 UNITS CHEW    Chew 1 tablet by mouth daily.   DOCUSATE SODIUM (COLACE) 100 MG CAPSULE    Take 100 mg by mouth daily.   LORATADINE (CLARITIN) 10 MG TABLET    Take by mouth daily.    MULTIPLE VITAMINS-MINERALS (PRESERVISION AREDS 2) CAPS    Take 1 capsule by mouth 2 (two) times daily.   RANITIDINE (ZANTAC) 150 MG TABLET    Take by mouth daily.    VERAPAMIL (CALAN-SR) 240 MG CR TABLET    TAKE ONE (1) TABLET BY MOUTH EVERY DAY   XARELTO 15 MG TABS TABLET    TAKE ONE (1) TABLET BY MOUTH EVERY DAY WITH SUPPER    Review of Systems  Constitutional: Negative.   Respiratory: Negative.   Cardiovascular: Negative.   Musculoskeletal: Negative.     Social History  Substance Use Topics  . Smoking status: Never Smoker   . Smokeless tobacco: Not on file  . Alcohol Use: No   Objective:   BP 128/72 mmHg  Pulse 88  Temp(Src) 98.1 F (36.7 C)  Resp 16  Ht 5\' 2"  (1.575 m)  Wt 116 lb (52.617 kg)  BMI 21.21 kg/m2  Physical Exam   General Appearance:    Alert, cooperative, no distress  Eyes:    PERRL, conjunctiva/corneas clear, EOM's intact       Lungs:     Clear to auscultation bilaterally, respirations unlabored  Heart:     Irregularly irregular rhythm. Normal rate  Neurologic:   Awake, alert, oriented x 3. No apparent focal neurological           defect.           Assessment & Plan:     1. Atrial fibrillation, unspecified type (HCC) Well controlled. Asymptomatic  2. Essential hypertension Well controlled.  Continue current medications.   Return in about 4 months (around 02/25/2016).     The entirety of the information documented in the History of Present Illness, Review of Systems and Physical Exam were personally obtained by me. Portions of this information  were initially documented by Anson Oregonachelle Presley, CMA and reviewed by me for thoroughness and accuracy.       Mila Merryonald Julane Crock, MD  [facility]

## 2016-01-25 ENCOUNTER — Other Ambulatory Visit: Payer: Self-pay | Admitting: Family Medicine

## 2016-02-21 ENCOUNTER — Ambulatory Visit (INDEPENDENT_AMBULATORY_CARE_PROVIDER_SITE_OTHER): Payer: Medicare Other | Admitting: Family Medicine

## 2016-02-21 ENCOUNTER — Encounter: Payer: Self-pay | Admitting: Family Medicine

## 2016-02-21 VITALS — BP 140/87 | HR 88 | Temp 97.4°F | Resp 18 | Wt 113.0 lb

## 2016-02-21 DIAGNOSIS — D539 Nutritional anemia, unspecified: Secondary | ICD-10-CM

## 2016-02-21 DIAGNOSIS — I10 Essential (primary) hypertension: Secondary | ICD-10-CM

## 2016-02-21 DIAGNOSIS — Z23 Encounter for immunization: Secondary | ICD-10-CM | POA: Diagnosis not present

## 2016-02-21 DIAGNOSIS — E559 Vitamin D deficiency, unspecified: Secondary | ICD-10-CM | POA: Diagnosis not present

## 2016-02-21 DIAGNOSIS — N183 Chronic kidney disease, stage 3 unspecified: Secondary | ICD-10-CM

## 2016-02-21 DIAGNOSIS — M810 Age-related osteoporosis without current pathological fracture: Secondary | ICD-10-CM

## 2016-02-21 DIAGNOSIS — R251 Tremor, unspecified: Secondary | ICD-10-CM

## 2016-02-21 DIAGNOSIS — I4891 Unspecified atrial fibrillation: Secondary | ICD-10-CM

## 2016-02-21 NOTE — Progress Notes (Signed)
Patient: Tami [name] Female    DOB: [birth year]   82 y.o.   MRN: [id number] Visit Date: 02/21/2016  Today's Provider: Mila Merry, MD   Chief Complaint  Patient presents with  . Hypertension    follow up  . Atrial Fibrillation    follow up   Subjective:    HPI  Hypertension, follow-up:  BP Readings from Last 3 Encounters:  10/25/15 128/72  06/28/15 128/68  05/24/15 124/74    She was last seen for hypertension 4 months ago.  BP at that visit was 128/72. Management since that visit includes no changes. She reports good compliance with treatment. She is not having side effects.  She is exercising. She is adherent to low salt diet.   Outside blood pressures are not being checked. She is experiencing none.  Patient denies chest pain, chest pressure/discomfort, claudication, dyspnea, exertional chest pressure/discomfort, fatigue, irregular heart beat, lower extremity edema, near-syncope, orthopnea, palpitations, paroxysmal nocturnal dyspnea, syncope and tachypnea.   Cardiovascular risk factors include advanced age (older than 67 for men, 58 for women) and hypertension.  Use of agents associated with hypertension: none.     Weight trend: decreasing steadily Wt Readings from Last 3 Encounters:  10/25/15 116 lb (52.6 kg)  06/28/15 115 lb (52.2 kg)  05/24/15 111 lb (50.3 kg)    Current diet: well balanced  ------------------------------------------------------------------------ Follow up Atrial Fibrillation:  Patient was last seen for this problem 4 months ago and no changes were made. Patient reports good compliance with treatment.     Tremor Patient daughter reports patient has been having worsening tremor for several months. It can occur at rest and with intention. Involves both hands, arms and head. Has had some trouble with walking gait and has fallen at least once. Reports that several relatives had similar tremor as they aged.   Allergies  Allergen  Reactions  . Maxzide  [Hydrochlorothiazide W-Triamterene]   . Sulfa Antibiotics      Current Outpatient Prescriptions:  .  benazepril (LOTENSIN) 10 MG tablet, TAKE ONE (1) TABLET EACH DAY, Disp: 30 tablet, Rfl: 6 .  Cholecalciferol (VITAMIN D3) 2000 UNITS CHEW, Chew 1 tablet by mouth daily., Disp: 1 tablet, Rfl: 2 .  docusate sodium (COLACE) 100 MG capsule, Take 100 mg by mouth daily., Disp: , Rfl:  .  loratadine (CLARITIN) 10 MG tablet, Take by mouth daily. , Disp: , Rfl:  .  Multiple Vitamins-Minerals (PRESERVISION AREDS 2) CAPS, Take 1 capsule by mouth 2 (two) times daily., Disp: , Rfl:  .  ranitidine (ZANTAC) 150 MG tablet, Take by mouth daily. , Disp: , Rfl:  .  verapamil (CALAN-SR) 240 MG CR tablet, TAKE ONE (1) TABLET BY MOUTH EVERY DAY, Disp: 30 tablet, Rfl: 12 .  XARELTO 15 MG TABS tablet, TAKE ONE (1) TABLET BY MOUTH EVERY DAY WITH SUPPER, Disp: 30 tablet, Rfl: 12 .  alendronate (FOSAMAX) 70 MG tablet, Take by mouth once a week. , Disp: , Rfl:   Review of Systems  Constitutional: Negative for appetite change, chills, fatigue and fever.  HENT:       Decreased hearing  Eyes:       Decrease in vision  Respiratory: Negative for chest tightness and shortness of breath.   Cardiovascular: Negative for chest pain and palpitations.  Gastrointestinal: Negative for abdominal pain, nausea and vomiting.  Neurological: Positive for tremors. Negative for dizziness and weakness.  Psychiatric/Behavioral: The patient is nervous/anxious.     Social  History  Substance Use Topics  . Smoking status: Never Smoker  . Smokeless tobacco: Never Used  . Alcohol use No   Objective:   BP 140/87 (BP Location: Left Arm, Patient Position: Sitting, Cuff Size: Normal)   Pulse 88   Temp 97.4 F (36.3 C) (Oral)   Resp 18   Wt 113 lb (51.3 kg)   BMI 20.67 kg/m   Physical Exam   General Appearance:    Alert, cooperative, no distress  Eyes:    PERRL, conjunctiva/corneas clear, EOM's intact         Lungs:     Clear to auscultation bilaterally, respirations unlabored  Heart:     Irregularly irregular rhythm. Normal rate   Neurologic:   Awake, alert, oriented x 3. No focal defects. Mild tremor of both hands and head. Maybe a little more pronounce with intention. Gait is a bit shuffled and she has slight cogwheeling.biltaerally.           Assessment & Plan:     1. Atrial fibrillation, unspecified type (HCC) Rate well controlled. Asymptomatic.   2. Essential hypertension Well controlled.  Continue current medications.   - Magnesium  3. Osteoporosis Doing well on weekly Fosamax.   4. Vitamin D deficiency  - VITAMIN D 25 Hydroxy (Vit-D Deficiency, Fractures)  5. Chronic kidney disease, stage 3  - Renal function panel  6. Macrocytic anemia  - CBC  7. 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 She is having some trouble with mobility and at lease one fall. I recommended she have consultation with neurology to better characterize her tremor and gait difficulties.  - T4 AND TSH - Ambulatory referral to Neurology  8. Need for influenza vaccination  - Flu vaccine HIGH DOSE PF        Mila Merryonald Fisher, MD  [facility]

## 2016-02-22 LAB — RENAL FUNCTION PANEL
Albumin: 4.1 g/dL (ref 3.2–4.6)
BUN / CREAT RATIO: 15 (ref 12–28)
BUN: 21 mg/dL (ref 10–36)
CALCIUM: 8.9 mg/dL (ref 8.7–10.3)
CHLORIDE: 100 mmol/L (ref 96–106)
CO2: 22 mmol/L (ref 18–29)
Creatinine, Ser: 1.36 mg/dL — ABNORMAL HIGH (ref 0.57–1.00)
GFR calc non Af Amer: 34 mL/min/{1.73_m2} — ABNORMAL LOW (ref >59)
GFR, EST AFRICAN AMERICAN: 40 mL/min/{1.73_m2} — AB (ref >59)
Glucose: 94 mg/dL (ref 65–99)
Phosphorus: 3.1 mg/dL (ref 2.5–4.5)
Potassium: 4.5 mmol/L (ref 3.5–5.2)
SODIUM: 138 mmol/L (ref 134–144)

## 2016-02-22 LAB — CBC
HEMATOCRIT: 30.9 % — AB (ref 34.0–46.6)
Hemoglobin: 10.5 g/dL — ABNORMAL LOW (ref 11.1–15.9)
MCH: 34.2 pg — ABNORMAL HIGH (ref 26.6–33.0)
MCHC: 34 g/dL (ref 31.5–35.7)
MCV: 101 fL — AB (ref 79–97)
PLATELETS: 132 10*3/uL — AB (ref 150–379)
RBC: 3.07 x10E6/uL — ABNORMAL LOW (ref 3.77–5.28)
RDW: 13.7 % (ref 12.3–15.4)
WBC: 4.9 10*3/uL (ref 3.4–10.8)

## 2016-02-22 LAB — VITAMIN D 25 HYDROXY (VIT D DEFICIENCY, FRACTURES): Vit D, 25-Hydroxy: 35 ng/mL (ref 30.0–100.0)

## 2016-02-22 LAB — T4 AND TSH
T4, Total: 5.1 ug/dL (ref 4.5–12.0)
TSH: 10.6 u[IU]/mL — AB (ref 0.450–4.500)

## 2016-02-22 LAB — MAGNESIUM: MAGNESIUM: 2.1 mg/dL (ref 1.6–2.3)

## 2016-02-28 ENCOUNTER — Telehealth: Payer: Self-pay | Admitting: Family Medicine

## 2016-02-28 NOTE — Telephone Encounter (Signed)
Please review. We do have samples of Xarelto 15mg  available. Thanks!

## 2016-02-28 NOTE — Telephone Encounter (Signed)
Sherri with [facility] stated pt's out of pocket cost went from 45$ to 161$ for XARELTO 15 MG TABS tablet. Sherri was asking is there was something cheaper pt could take or if we have samples the pt can have. Sherri stated she wasn't sure what other medications might be appropriate for pt to take in replacing the medication. Sherri was advised that Dr. Sherrie MustacheFisher is out of the office until 03/06/16 and would like another provider to review. Please advise. Thanks TNP

## 2016-02-29 ENCOUNTER — Telehealth: Payer: Self-pay | Admitting: Family Medicine

## 2016-02-29 NOTE — Telephone Encounter (Signed)
Advised daughter as below.  

## 2016-02-29 NOTE — Telephone Encounter (Signed)
Rachelle,  I have 3 bottles on my desk you can give her.  Any other changes will need to be done by Dr Sherrie MustacheFisher when he gets back.   Thanks Northwest AirlinesElena

## 2016-02-29 NOTE — Telephone Encounter (Signed)
Pt daughter states due to pt being in the gap with her insurance the Rx for XARELTO 15 MG TABS tablet is going to cost $170.00 a month.  Pt daughter is asking if we have samples of this.  [id number]#[phone]/WJCB#[phone]/MW

## 2016-02-29 NOTE — Telephone Encounter (Signed)
Samples left up front for the patient.

## 2016-03-27 ENCOUNTER — Telehealth: Payer: Self-pay | Admitting: Family Medicine

## 2016-03-27 NOTE — Telephone Encounter (Signed)
Can have 4 weeks supply.

## 2016-03-27 NOTE — Telephone Encounter (Signed)
Pt's daughter Okey DupreRose wanted to see if pt could get samples of XARELTO 15 MG TABS tablet. [name] stated pt is in the gap and it will cost 170$ out of pocket for 30 pills. Please advise. Thanks TNP

## 2016-03-27 NOTE — Telephone Encounter (Signed)
Please advise 

## 2016-03-27 NOTE — Telephone Encounter (Signed)
Samples collected and placed in a bag. Left message for [name] to call back. Samples are still on my desk. Waiting for daughter to call back before placing them up front for pick up.

## 2016-03-27 NOTE — Telephone Encounter (Signed)
Pt's daughter called back    Call back [phone]  Thanks Barth Kirkseri

## 2016-03-27 NOTE — Telephone Encounter (Signed)
Patient daughter Okey DupreRose advised that samples are ready for pick up. Samples  Placed up front.

## 2016-04-12 ENCOUNTER — Other Ambulatory Visit: Payer: Self-pay | Admitting: Neurology

## 2016-04-12 DIAGNOSIS — E538 Deficiency of other specified B group vitamins: Secondary | ICD-10-CM | POA: Diagnosis not present

## 2016-04-12 DIAGNOSIS — R251 Tremor, unspecified: Secondary | ICD-10-CM | POA: Diagnosis not present

## 2016-04-12 DIAGNOSIS — R4189 Other symptoms and signs involving cognitive functions and awareness: Secondary | ICD-10-CM

## 2016-04-18 ENCOUNTER — Ambulatory Visit
Admission: RE | Admit: 2016-04-18 | Discharge: 2016-04-18 | Disposition: A | Discharge status: Home / Self Care | Payer: Medicare Other | Source: Ambulatory Visit | Attending: Neurology | Admitting: Neurology

## 2016-04-18 DIAGNOSIS — I6782 Cerebral ischemia: Secondary | ICD-10-CM | POA: Diagnosis not present

## 2016-04-18 DIAGNOSIS — R4189 Other symptoms and signs involving cognitive functions and awareness: Secondary | ICD-10-CM

## 2016-04-18 DIAGNOSIS — G319 Degenerative disease of nervous system, unspecified: Secondary | ICD-10-CM | POA: Diagnosis not present

## 2016-04-18 DIAGNOSIS — R251 Tremor, unspecified: Secondary | ICD-10-CM | POA: Diagnosis not present

## 2016-05-01 ENCOUNTER — Telehealth: Payer: Self-pay | Admitting: Family Medicine

## 2016-05-01 MED ORDER — RIVAROXABAN 15 MG PO TABS: ORAL_TABLET | ORAL | 0 refills | Status: DC

## 2016-05-01 NOTE — Telephone Encounter (Signed)
We have samples in office,please review. KW

## 2016-05-01 NOTE — Telephone Encounter (Signed)
Patient's daughter was notified.

## 2016-05-01 NOTE — Addendum Note (Signed)
Addended by: Marlene LardMILLER, Zalia Hautala M on: 05/01/2016 03:37 PM   Modules accepted: Orders

## 2016-05-01 NOTE — Telephone Encounter (Signed)
Can have two bottles of xarelto 15mg  if we have them. Thanks.

## 2016-05-01 NOTE — Telephone Encounter (Signed)
Pt daughter called to request samples for XARELTO 15 MG TABS tablet.  AV#[phone]/WGCB#[phone]/MW

## 2016-05-10 DIAGNOSIS — G301 Alzheimer's disease with late onset: Secondary | ICD-10-CM | POA: Diagnosis not present

## 2016-05-10 DIAGNOSIS — R251 Tremor, unspecified: Secondary | ICD-10-CM | POA: Diagnosis not present

## 2016-05-10 DIAGNOSIS — I4891 Unspecified atrial fibrillation: Secondary | ICD-10-CM | POA: Diagnosis not present

## 2016-06-07 ENCOUNTER — Other Ambulatory Visit: Payer: Self-pay | Admitting: Family Medicine

## 2016-06-09 ENCOUNTER — Telehealth: Payer: Self-pay | Admitting: Family Medicine

## 2016-06-09 MED ORDER — RIVAROXABAN 15 MG PO TABS: 15.0000 mg | ORAL_TABLET | Freq: Every day | ORAL | 0 refills | Status: DC

## 2016-06-09 NOTE — Telephone Encounter (Signed)
Pt daughter called states pt Rx for XARELTO 15 MG is going to cost $215.00 for 30 pills.  Pt daughter is requesting samples if possible.  [id number]#[phone]/MW

## 2016-06-09 NOTE — Telephone Encounter (Signed)
Can have a month's supply at one each day.

## 2016-06-09 NOTE — Telephone Encounter (Signed)
Please review-aa 

## 2016-06-09 NOTE — Telephone Encounter (Signed)
Samples placed up front for pick up.  

## 2016-07-12 ENCOUNTER — Ambulatory Visit: Payer: Medicare Other

## 2016-08-16 ENCOUNTER — Ambulatory Visit (INDEPENDENT_AMBULATORY_CARE_PROVIDER_SITE_OTHER): Payer: Medicare Other | Admitting: Family Medicine

## 2016-08-16 ENCOUNTER — Encounter: Payer: Self-pay | Admitting: Family Medicine

## 2016-08-16 VITALS — BP 112/70 | HR 76 | Temp 97.7°F | Resp 16 | Wt 113.0 lb

## 2016-08-16 DIAGNOSIS — D539 Nutritional anemia, unspecified: Secondary | ICD-10-CM | POA: Diagnosis not present

## 2016-08-16 DIAGNOSIS — I10 Essential (primary) hypertension: Secondary | ICD-10-CM

## 2016-08-16 DIAGNOSIS — M810 Age-related osteoporosis without current pathological fracture: Secondary | ICD-10-CM

## 2016-08-16 DIAGNOSIS — I4891 Unspecified atrial fibrillation: Secondary | ICD-10-CM | POA: Diagnosis not present

## 2016-08-16 DIAGNOSIS — E559 Vitamin D deficiency, unspecified: Secondary | ICD-10-CM

## 2016-08-16 MED ORDER — BENAZEPRIL HCL 10 MG PO TABS: ORAL_TABLET | ORAL | 6 refills | Status: DC

## 2016-08-16 NOTE — Patient Instructions (Signed)
   Please contact your eyecare professional to schedule a routine eye exam  

## 2016-08-16 NOTE — Progress Notes (Signed)
Patient: Tami [name] Female    DOB: 01-14-1924   83 y.o.   MRN: [id number] Visit Date: 08/16/2016  Today's Provider: Mila Merry, MD   Chief Complaint  Patient presents with  . Atrial Fibrillation    follow up  . Hypertension    follow up  . Osteoporosis    follow up  . Chronic Kidney Disease    follow up  . Hypothyroidism    follow up   Subjective:    HPI Follow up of Atrial Fibrillation:  Patient was last seen for this problem 6 months ago and no changes were made.   Hypertension, follow-up:  BP Readings from Last 3 Encounters:  02/21/16 140/87  10/25/15 128/72  06/28/15 128/68    She was last seen for hypertension 6 months ago.  BP at that visit was 140/87. Management since that visit includes no changes. She reports good compliance with treatment. She is not having side effects.  She is exercising. She is adherent to low salt diet.   Outside blood pressures are not being checked. She is experiencing none.  Patient denies chest pain, chest pressure/discomfort, claudication, dyspnea, exertional chest pressure/discomfort, fatigue, irregular heart beat, lower extremity edema, near-syncope, orthopnea, palpitations, paroxysmal nocturnal dyspnea, syncope and tachypnea.   Cardiovascular risk factors include advanced age (older than 44 for men, 68 for women) and hypertension.  Use of agents associated with hypertension: none.     Weight trend: stable Wt Readings from Last 3 Encounters:  02/21/16 113 lb (51.3 kg)  10/25/15 116 lb (52.6 kg)  06/28/15 115 lb (52.2 kg)    Current diet: well balanced  ------------------------------------------------------------------------ Follow up of Osteoporosis:  Patient was last seen for this problem 6 months ago and no changes were made. Patient was advised to continue weekly Fosamax. Patient comes in today with her daughter reporting that she stopped taking Fosamax several months ago.   Follow up of  CKD:  Patient was last seen for this problem 6 months ago and no changes were made.  Follow up of Vitamin D Deficiency:  Patient was last seen for this problem 6 months ago and no changes were made.       Allergies  Allergen Reactions  . Maxzide  [Hydrochlorothiazide W-Triamterene]   . Sulfa Antibiotics      Current Outpatient Prescriptions:  .  benazepril (LOTENSIN) 10 MG tablet, TAKE ONE (1) TABLET EACH DAY, Disp: 30 tablet, Rfl: 6 .  Cholecalciferol (VITAMIN D3) 2000 UNITS CHEW, Chew 1 tablet by mouth daily., Disp: 1 tablet, Rfl: 2 .  docusate sodium (COLACE) 100 MG capsule, Take 100 mg by mouth daily., Disp: , Rfl:  .  loratadine (CLARITIN) 10 MG tablet, Take by mouth daily. , Disp: , Rfl:  .  Multiple Vitamins-Minerals (PRESERVISION AREDS 2) CAPS, Take 1 capsule by mouth 2 (two) times daily., Disp: , Rfl:  .  ranitidine (ZANTAC) 150 MG tablet, Take by mouth daily. , Disp: , Rfl:  .  Rivaroxaban (XARELTO) 15 MG TABS tablet, Take 1 tablet (15 mg total) by mouth daily with supper., Disp: 35 tablet, Rfl: 0 .  verapamil (CALAN-SR) 240 MG CR tablet, TAKE ONE (1) TABLET BY MOUTH EVERY DAY, Disp: 30 tablet, Rfl: 12 .  alendronate (FOSAMAX) 70 MG tablet, Take by mouth once a week. , Disp: , Rfl:   Review of Systems  Constitutional: Negative for appetite change, chills, fatigue and fever.  Respiratory: Negative for chest tightness and shortness of  breath.   Cardiovascular: Negative for chest pain and palpitations.  Gastrointestinal: Negative for abdominal pain, nausea and vomiting.  Neurological: Negative for dizziness and weakness.  Psychiatric/Behavioral: Positive for agitation, confusion and decreased concentration.    Social History  Substance Use Topics  . Smoking status: Never Smoker  . Smokeless tobacco: Never Used  . Alcohol use No   Objective:   BP 112/70 (BP Location: Left Arm, Patient Position: Sitting, Cuff Size: Normal)   Temp 97.7 F (36.5 C) (Oral)   Resp 16    Wt 113 lb (51.3 kg)   BMI 20.67 kg/m  There were no vitals filed for this visit.   Physical Exam   General Appearance:    Alert, cooperative, no distress  Eyes:    PERRL, conjunctiva/corneas clear, EOM's intact       Lungs:     Clear to auscultation bilaterally, respirations unlabored  Heart:   Irregularly irregular rhythm. Normal rate   Neurologic:   Awake, alert, oriented x 3. No apparent focal neurological           defect.            Assessment & Plan:     1. Atrial fibrillation, unspecified type (HCC) Asymptomatic. Compliant with medication.  Continue aggressive risk factor modification.   - CBC  2. Essential hypertension Well controlled.  Continue current medications.   - Renal function panel  3. Osteoporosis, unspecified osteoporosis type, unspecified pathological fracture presence Doing well on Fosamax.   4. Vitamin D deficiency  - VITAMIN D 25 Hydroxy (Vit-D Deficiency, Fractures)  5. Macrocytic anemia CBC  Return in about 6 months (around 02/16/2017).        Mila Merryonald Trini Soldo, MD  [facility]

## 2016-08-17 DIAGNOSIS — I10 Essential (primary) hypertension: Secondary | ICD-10-CM | POA: Diagnosis not present

## 2016-08-17 DIAGNOSIS — Z961 Presence of intraocular lens: Secondary | ICD-10-CM | POA: Diagnosis not present

## 2016-08-17 DIAGNOSIS — H353133 Nonexudative age-related macular degeneration, bilateral, advanced atrophic without subfoveal involvement: Secondary | ICD-10-CM | POA: Diagnosis not present

## 2016-08-17 DIAGNOSIS — H52223 Regular astigmatism, bilateral: Secondary | ICD-10-CM | POA: Diagnosis not present

## 2016-08-17 LAB — CBC
HEMATOCRIT: 37.6 % (ref 34.0–46.6)
HEMOGLOBIN: 12.3 g/dL (ref 11.1–15.9)
MCH: 33.2 pg — AB (ref 26.6–33.0)
MCHC: 32.7 g/dL (ref 31.5–35.7)
MCV: 101 fL — AB (ref 79–97)
Platelets: 142 10*3/uL — ABNORMAL LOW (ref 150–379)
RBC: 3.71 x10E6/uL — AB (ref 3.77–5.28)
RDW: 12.8 % (ref 12.3–15.4)
WBC: 5.2 10*3/uL (ref 3.4–10.8)

## 2016-08-17 LAB — RENAL FUNCTION PANEL
ALBUMIN: 4.8 g/dL — AB (ref 3.2–4.6)
BUN/Creatinine Ratio: 19 (ref 12–28)
BUN: 23 mg/dL (ref 10–36)
CO2: 23 mmol/L (ref 18–29)
CREATININE: 1.22 mg/dL — AB (ref 0.57–1.00)
Calcium: 9.6 mg/dL (ref 8.7–10.3)
Chloride: 95 mmol/L — ABNORMAL LOW (ref 96–106)
GFR calc Af Amer: 45 mL/min/{1.73_m2} — ABNORMAL LOW (ref >59)
GFR, EST NON AFRICAN AMERICAN: 39 mL/min/{1.73_m2} — AB (ref >59)
Glucose: 94 mg/dL (ref 65–99)
PHOSPHORUS: 3.4 mg/dL (ref 2.5–4.5)
POTASSIUM: 3.8 mmol/L (ref 3.5–5.2)
Sodium: 138 mmol/L (ref 134–144)

## 2016-08-17 LAB — VITAMIN D 25 HYDROXY (VIT D DEFICIENCY, FRACTURES): Vit D, 25-Hydroxy: 34.4 ng/mL (ref 30.0–100.0)

## 2016-10-24 ENCOUNTER — Other Ambulatory Visit: Payer: Self-pay | Admitting: Family Medicine

## 2017-01-17 ENCOUNTER — Telehealth: Payer: Self-pay

## 2017-01-17 NOTE — Telephone Encounter (Signed)
LMTCB and r/s AWV with [facility]. Previous apt was cancelled in 07/2016.

## 2017-02-14 ENCOUNTER — Encounter: Payer: Self-pay | Admitting: Family Medicine

## 2017-02-14 ENCOUNTER — Ambulatory Visit: Payer: Medicare Other | Admitting: Family Medicine

## 2017-02-14 ENCOUNTER — Ambulatory Visit (INDEPENDENT_AMBULATORY_CARE_PROVIDER_SITE_OTHER): Payer: Medicare Other | Admitting: Family Medicine

## 2017-02-14 VITALS — BP 128/60 | HR 82 | Temp 98.3°F | Resp 18 | Wt 114.0 lb

## 2017-02-14 DIAGNOSIS — M810 Age-related osteoporosis without current pathological fracture: Secondary | ICD-10-CM | POA: Diagnosis not present

## 2017-02-14 DIAGNOSIS — I4891 Unspecified atrial fibrillation: Secondary | ICD-10-CM

## 2017-02-14 DIAGNOSIS — I10 Essential (primary) hypertension: Secondary | ICD-10-CM

## 2017-02-14 DIAGNOSIS — Z23 Encounter for immunization: Secondary | ICD-10-CM

## 2017-02-14 NOTE — Progress Notes (Signed)
Patient: Tami [name] Female    DOB: [birth year]   83 y.o.   MRN: [id number] Visit Date: 02/14/2017  Today's Provider: Mila Merry, MD   Chief Complaint  Patient presents with  . Atrial Fibrillation  . Hypertension  . Anemia   Subjective:    HPI   Hypertension, follow-up:  BP Readings from Last 3 Encounters:  08/16/16 112/70  02/21/16 140/87  10/25/15 128/72    She was last seen for hypertension 6 months ago.  BP at that visit was 112/70. Management since that visit includes; no changes.She reports good compliance with treatment. She is not having side effects.  She is exercising. She is adherent to low salt diet.   Outside blood pressures are not being checked.. She is experiencing none.  Patient denies chest pain, chest pressure/discomfort, claudication, dyspnea, exertional chest pressure/discomfort, fatigue, irregular heart beat, lower extremity edema, near-syncope, orthopnea, palpitations, paroxysmal nocturnal dyspnea, syncope and tachypnea.   Cardiovascular risk factors include advanced age (older than 29 for men, 44 for women) and hypertension.  Use of agents associated with hypertension: none.   ------------------------------------------------------------------------ Atrial fibrillation, unspecified type (HCC) From 08/16/2016-no changes. Patient reports good compliance with treatment and good tolerance. Having no chest pain, dyspnea or palpitations.    Osteoporosis, unspecified osteoporosis type, unspecified pathological fracture presence From 08/16/2016-no changes. Doing well on Fosamax. Patient comes in reporting that she has not been taking Fosamax for more than 1 year.  Vitamin D deficiency From 08/16/2016-labs checked. No changes.  Patient reports good compliance with treatment and good tolerance.  Lab Results  Component Value Date   VD25OH 34.4 08/16/2016     Macrocytic anemia From 08/16/2016-labs checked. No changes.  Lab Results    Component Value Date   WBC 5.2 08/16/2016   HGB 12.3 08/16/2016   HCT 37.6 08/16/2016   MCV 101 (H) 08/16/2016   PLT 142 (L) 08/16/2016           Allergies  Allergen Reactions  . Maxzide  [Hydrochlorothiazide W-Triamterene]   . Sulfa Antibiotics      Current Outpatient Prescriptions:  .  benazepril (LOTENSIN) 10 MG tablet, TAKE ONE (1) TABLET EACH DAY, Disp: 30 tablet, Rfl: 6 .  Cholecalciferol (VITAMIN D3) 2000 UNITS CHEW, Chew 1 tablet by mouth daily., Disp: 1 tablet, Rfl: 2 .  docusate sodium (COLACE) 100 MG capsule, Take 100 mg by mouth daily., Disp: , Rfl:  .  loratadine (CLARITIN) 10 MG tablet, Take by mouth daily. , Disp: , Rfl:  .  Multiple Vitamins-Minerals (PRESERVISION AREDS 2) CAPS, Take 1 capsule by mouth 2 (two) times daily., Disp: , Rfl:  .  ranitidine (ZANTAC) 150 MG tablet, Take by mouth daily. , Disp: , Rfl:  .  Rivaroxaban (XARELTO) 15 MG TABS tablet, Take 1 tablet (15 mg total) by mouth daily with supper., Disp: 35 tablet, Rfl: 0 .  verapamil (CALAN-SR) 240 MG CR tablet, TAKE ONE (1) TABLET BY MOUTH EVERY DAY, Disp: 30 tablet, Rfl: 12 .  alendronate (FOSAMAX) 70 MG tablet, Take by mouth once a week. , Disp: , Rfl:   Review of Systems  Constitutional: Negative for appetite change, chills, fatigue and fever.  Respiratory: Negative for chest tightness and shortness of breath.   Cardiovascular: Negative for chest pain and palpitations.  Gastrointestinal: Negative for abdominal pain, nausea and vomiting.  Neurological: Negative for dizziness and weakness.    Social History  Substance Use Topics  . Smoking status: Never Smoker  .  Smokeless tobacco: Never Used  . Alcohol use No   Objective:   BP 128/60 (BP Location: Left Arm, Patient Position: Sitting, Cuff Size: Normal)   Pulse 82   Temp 98.3 F (36.8 C) (Oral)   Resp 18   Wt 114 lb (51.7 kg)   SpO2 96% Comment: room air  BMI 20.85 kg/m  There were no vitals filed for this  visit.   Physical Exam   General Appearance:    Alert, cooperative, no distress  Eyes:    PERRL, conjunctiva/corneas clear, EOM's intact       Lungs:     Clear to auscultation bilaterally, respirations unlabored  Heart:     Irregularly irregular rhythm. Normal rate   Neurologic:   Awake, alert, oriented x 3. No apparent focal neurological           defect.           Assessment & Plan:     1. Atrial fibrillation, unspecified type (HCC) Rate well controlled, tolerating NOAG well.   2. Essential hypertension Well controlled.  Continue current medications.    3. Osteoporosis, unspecified osteoporosis type, unspecified pathological fracture presence Stopped fosamax due to adverse effects and does not want to try any other medications. Will discuss further at follow. Continue vitamin d replacement.   4. Need for influenza vaccination  - Flu Vaccine QUAD 36+ mos IM       Mila Merryonald Fisher, MD  [facility]

## 2017-03-07 ENCOUNTER — Other Ambulatory Visit: Payer: Self-pay | Admitting: Family Medicine

## 2017-04-05 ENCOUNTER — Telehealth: Payer: Self-pay | Admitting: Family Medicine

## 2017-04-05 NOTE — Telephone Encounter (Signed)
Pt informed, samples upfront

## 2017-04-05 NOTE — Telephone Encounter (Signed)
Please advise 

## 2017-04-05 NOTE — Telephone Encounter (Signed)
Can have four bottles of 15mg  xarelto samples.

## 2017-05-07 ENCOUNTER — Telehealth: Payer: Self-pay | Admitting: Family Medicine

## 2017-05-07 NOTE — Telephone Encounter (Signed)
[name] was notified

## 2017-05-07 NOTE — Telephone Encounter (Signed)
Daughter called back regarding samples.    Her call back number [phone]  Thanks teri

## 2017-05-07 NOTE — Telephone Encounter (Signed)
Ok to give samples if available? Please advise. Thanks!

## 2017-05-07 NOTE — Telephone Encounter (Signed)
Pt daughter Okey DupreRose called to ask if pt can get samples for Rivaroxaban (XARELTO) 15 MG TABS tablet  CB#[phone]/MW

## 2017-05-07 NOTE — Telephone Encounter (Signed)
I don't think we have any samples.  

## 2017-07-05 ENCOUNTER — Other Ambulatory Visit: Payer: Self-pay | Admitting: Family Medicine

## 2017-07-05 MED ORDER — RIVAROXABAN 15 MG PO TABS: 15.0000 mg | ORAL_TABLET | Freq: Every day | ORAL | 12 refills | Status: DC

## 2017-07-05 NOTE — Telephone Encounter (Signed)
Patient is requesting a refill on the following medication.  Rivaroxaban (XARELTO) 15 MG TABS tablet   She uses [facility].

## 2017-07-06 MED ORDER — RIVAROXABAN 15 MG PO TABS: 15.0000 mg | ORAL_TABLET | Freq: Every day | ORAL | 12 refills | Status: DC

## 2017-07-06 NOTE — Addendum Note (Signed)
Addended by: Latanya Presser'DELL, Delray Reza M on: 07/06/2017 11:22 AM   Modules accepted: Orders

## 2017-07-06 NOTE — Telephone Encounter (Signed)
rx was not sent to pharmacy, it was in default "samples" as pt was getting samples of this. I resent it to the pharmacy.

## 2017-08-01 ENCOUNTER — Other Ambulatory Visit: Payer: Self-pay | Admitting: Family Medicine

## 2017-08-20 ENCOUNTER — Ambulatory Visit: Payer: Medicare Other | Admitting: Family Medicine

## 2017-08-23 ENCOUNTER — Telehealth: Payer: Self-pay | Admitting: Family Medicine

## 2017-08-23 NOTE — Telephone Encounter (Signed)
Left message re need to schedule MWV w [facility].  Appts Monday could be modified to Wellness and MWV if she can come early.

## 2017-08-27 ENCOUNTER — Ambulatory Visit: Payer: Self-pay | Admitting: Family Medicine

## 2017-09-04 ENCOUNTER — Telehealth: Payer: Self-pay | Admitting: Family Medicine

## 2017-09-04 ENCOUNTER — Encounter: Payer: Self-pay | Admitting: Family Medicine

## 2017-09-04 ENCOUNTER — Ambulatory Visit (INDEPENDENT_AMBULATORY_CARE_PROVIDER_SITE_OTHER): Payer: Medicare Other | Admitting: Family Medicine

## 2017-09-04 VITALS — BP 130/70 | HR 96 | Temp 97.4°F | Resp 16 | Wt 117.0 lb

## 2017-09-04 DIAGNOSIS — I4891 Unspecified atrial fibrillation: Secondary | ICD-10-CM

## 2017-09-04 DIAGNOSIS — I10 Essential (primary) hypertension: Secondary | ICD-10-CM

## 2017-09-04 MED ORDER — VERAPAMIL HCL ER 240 MG PO TBCR: EXTENDED_RELEASE_TABLET | ORAL | 12 refills | Status: DC

## 2017-09-04 NOTE — Telephone Encounter (Signed)
Left message for patient to return call for a appt. °

## 2017-09-04 NOTE — Progress Notes (Signed)
Patient: Tami [name] Female    DOB: 1923-12-25   84 y.o.   MRN: [id number] Visit Date: 09/04/2017  Today's Provider: Mila Merry, MD   Chief Complaint  Patient presents with  . Follow-up  . Hypertension   Subjective:    HPI   Hypertension, follow-up:  BP Readings from Last 3 Encounters:  09/04/17 130/70  02/14/17 128/60  08/16/16 112/70    She was last seen for hypertension 7 months ago.  BP at that visit was 128/60. Management since that visit includes; no changes.She reports good compliance with treatment. She is not having side effects. none She is not exercising. She is adherent to low salt diet.   Outside blood pressures are; not checking. She is experiencing none.  Patient denies none.   Cardiovascular risk factors include advanced age (older than 38 for men, 77 for women).  Use of agents associated with hypertension: none.   ----------------------------------------------------------------   Atrial fibrillation, unspecified type (HCC) From 02/14/2017. Is doing well with current medications. No dyspnea, chest pains or palpations. No dizziness or light headedness.   Osteoporosis, unspecified osteoporosis type, unspecified pathological fracture presence From 02/14/2017-Stopped fosamax due to adverse effects. She continues to refuse any other treatments.        Allergies  Allergen Reactions  . Maxzide  [Hydrochlorothiazide W-Triamterene]   . Sulfa Antibiotics      Current Outpatient Medications:  .  benazepril (LOTENSIN) 10 MG tablet, TAKE ONE TABLET BY MOUTH DAILY, Disp: 90 tablet, Rfl: 3 .  Cholecalciferol (VITAMIN D3) 2000 UNITS CHEW, Chew 1 tablet by mouth daily., Disp: 1 tablet, Rfl: 2 .  docusate sodium (COLACE) 100 MG capsule, Take 100 mg by mouth daily., Disp: , Rfl:  .  loratadine (CLARITIN) 10 MG tablet, Take by mouth daily. , Disp: , Rfl:  .  Rivaroxaban (XARELTO) 15 MG TABS tablet, Take 1 tablet (15 mg total) by mouth daily with  supper., Disp: 35 tablet, Rfl: 12 .  verapamil (CALAN-SR) 240 MG CR tablet, TAKE ONE (1) TABLET BY MOUTH EVERY DAY, Disp: 30 tablet, Rfl: 12 .  Multiple Vitamins-Minerals (PRESERVISION AREDS 2) CAPS, Take 1 capsule by mouth 2 (two) times daily., Disp: , Rfl:  .  ranitidine (ZANTAC) 150 MG tablet, Take by mouth daily. , Disp: , Rfl:   Review of Systems  Constitutional: Negative for appetite change, chills, fatigue and fever.  Respiratory: Negative for chest tightness and shortness of breath.   Cardiovascular: Negative for chest pain and palpitations.  Gastrointestinal: Negative for abdominal pain, nausea and vomiting.  Neurological: Negative for dizziness and weakness.    Social History   Tobacco Use  . Smoking status: Never Smoker  . Smokeless tobacco: Never Used  Substance Use Topics  . Alcohol use: No    Alcohol/week: 0.0 oz   Objective:   BP 130/70 (BP Location: Right Arm, Patient Position: Sitting, Cuff Size: Normal)   Pulse 96   Temp (!) 97.4 F (36.3 C) (Oral)   Resp 16   Wt 117 lb (53.1 kg)   SpO2 99%   BMI 21.40 kg/m  Vitals:   09/04/17 1059  BP: 130/70  Pulse: 96  Resp: 16  Temp: (!) 97.4 F (36.3 C)  TempSrc: Oral  SpO2: 99%  Weight: 117 lb (53.1 kg)     Physical Exam   General Appearance:    Alert, cooperative, no distress  Eyes:    PERRL, conjunctiva/corneas clear, EOM's intact  Lungs:     Clear to auscultation bilaterally, respirations unlabored  Heart:     Irregularly irregular rhythm. Normal rate   Neurologic:   Awake, alert, oriented x 3. No apparent focal neurological           defect.           Assessment & Plan:     1. Atrial fibrillation, unspecified type (HCC) Well controlled.  Doing well on Xarelto  2. Essential hypertension Well controlled.  Continue current medications.    Return in about 6 months (around 03/06/2018).       Mila Merryonald Declynn Lopresti, MD  [facility]

## 2017-09-05 ENCOUNTER — Other Ambulatory Visit: Payer: Self-pay | Admitting: Family Medicine

## 2017-10-28 ENCOUNTER — Other Ambulatory Visit: Payer: Self-pay | Admitting: Family Medicine

## 2017-12-05 ENCOUNTER — Telehealth: Payer: Self-pay

## 2017-12-05 MED ORDER — RIVAROXABAN 15 MG PO TABS: 15.0000 mg | ORAL_TABLET | Freq: Every day | ORAL | 12 refills | Status: DC

## 2017-12-05 NOTE — Telephone Encounter (Signed)
Please advise 

## 2017-12-05 NOTE — Telephone Encounter (Signed)
Ms Margrett RudMay,[name] calling regarding her Mother Tami [name]. Ms Okey DupreRose is requesting if the quantity for the following medication Rivaroxaban (XARELTO) 15 MG TABS tablet be change to 30 instead of it saying Qty:35 because it cost her $45 and if they add more they end up paying 90 dollar and she is asking if we can send in the correct quantity.

## 2017-12-26 ENCOUNTER — Other Ambulatory Visit: Payer: Self-pay

## 2017-12-26 ENCOUNTER — Emergency Department: Payer: Medicare Other

## 2017-12-26 ENCOUNTER — Inpatient Hospital Stay
Admission: EM | Admit: 2017-12-26 | Discharge: 2017-12-31 | DRG: 481 | Disposition: A | Discharge status: Skilled Nursing Facility | Payer: Medicare Other | Attending: Internal Medicine | Admitting: Internal Medicine

## 2017-12-26 DIAGNOSIS — Z888 Allergy status to other drugs, medicaments and biological substances status: Secondary | ICD-10-CM

## 2017-12-26 DIAGNOSIS — I129 Hypertensive chronic kidney disease with stage 1 through stage 4 chronic kidney disease, or unspecified chronic kidney disease: Secondary | ICD-10-CM | POA: Diagnosis not present

## 2017-12-26 DIAGNOSIS — W1830XA Fall on same level, unspecified, initial encounter: Secondary | ICD-10-CM | POA: Diagnosis present

## 2017-12-26 DIAGNOSIS — N189 Chronic kidney disease, unspecified: Secondary | ICD-10-CM | POA: Diagnosis present

## 2017-12-26 DIAGNOSIS — I482 Chronic atrial fibrillation: Secondary | ICD-10-CM | POA: Diagnosis not present

## 2017-12-26 DIAGNOSIS — Z79899 Other long term (current) drug therapy: Secondary | ICD-10-CM | POA: Diagnosis not present

## 2017-12-26 DIAGNOSIS — S299XXA Unspecified injury of thorax, initial encounter: Secondary | ICD-10-CM | POA: Diagnosis not present

## 2017-12-26 DIAGNOSIS — E559 Vitamin D deficiency, unspecified: Secondary | ICD-10-CM | POA: Diagnosis not present

## 2017-12-26 DIAGNOSIS — S72002A Fracture of unspecified part of neck of left femur, initial encounter for closed fracture: Secondary | ICD-10-CM

## 2017-12-26 DIAGNOSIS — R64 Cachexia: Secondary | ICD-10-CM | POA: Diagnosis not present

## 2017-12-26 DIAGNOSIS — Z419 Encounter for procedure for purposes other than remedying health state, unspecified: Secondary | ICD-10-CM

## 2017-12-26 DIAGNOSIS — E785 Hyperlipidemia, unspecified: Secondary | ICD-10-CM | POA: Diagnosis not present

## 2017-12-26 DIAGNOSIS — S52502A Unspecified fracture of the lower end of left radius, initial encounter for closed fracture: Secondary | ICD-10-CM | POA: Diagnosis present

## 2017-12-26 DIAGNOSIS — I739 Peripheral vascular disease, unspecified: Secondary | ICD-10-CM | POA: Diagnosis present

## 2017-12-26 DIAGNOSIS — Z681 Body mass index (BMI) 19 or less, adult: Secondary | ICD-10-CM

## 2017-12-26 DIAGNOSIS — M79662 Pain in left lower leg: Secondary | ICD-10-CM | POA: Diagnosis not present

## 2017-12-26 DIAGNOSIS — Z8781 Personal history of (healed) traumatic fracture: Secondary | ICD-10-CM

## 2017-12-26 DIAGNOSIS — S52592D Other fractures of lower end of left radius, subsequent encounter for closed fracture with routine healing: Secondary | ICD-10-CM | POA: Diagnosis not present

## 2017-12-26 DIAGNOSIS — M810 Age-related osteoporosis without current pathological fracture: Secondary | ICD-10-CM | POA: Diagnosis present

## 2017-12-26 DIAGNOSIS — Z7401 Bed confinement status: Secondary | ICD-10-CM | POA: Diagnosis not present

## 2017-12-26 DIAGNOSIS — Z7901 Long term (current) use of anticoagulants: Secondary | ICD-10-CM | POA: Diagnosis not present

## 2017-12-26 DIAGNOSIS — S52592A Other fractures of lower end of left radius, initial encounter for closed fracture: Secondary | ICD-10-CM | POA: Diagnosis not present

## 2017-12-26 DIAGNOSIS — I4891 Unspecified atrial fibrillation: Secondary | ICD-10-CM | POA: Diagnosis not present

## 2017-12-26 DIAGNOSIS — S72009A Fracture of unspecified part of neck of unspecified femur, initial encounter for closed fracture: Secondary | ICD-10-CM | POA: Diagnosis not present

## 2017-12-26 DIAGNOSIS — S62102A Fracture of unspecified carpal bone, left wrist, initial encounter for closed fracture: Secondary | ICD-10-CM | POA: Diagnosis not present

## 2017-12-26 DIAGNOSIS — W57XXXA Bitten or stung by nonvenomous insect and other nonvenomous arthropods, initial encounter: Secondary | ICD-10-CM | POA: Diagnosis not present

## 2017-12-26 DIAGNOSIS — H9190 Unspecified hearing loss, unspecified ear: Secondary | ICD-10-CM | POA: Diagnosis not present

## 2017-12-26 DIAGNOSIS — S72142A Displaced intertrochanteric fracture of left femur, initial encounter for closed fracture: Secondary | ICD-10-CM | POA: Diagnosis not present

## 2017-12-26 DIAGNOSIS — Z9889 Other specified postprocedural states: Secondary | ICD-10-CM

## 2017-12-26 DIAGNOSIS — S72142D Displaced intertrochanteric fracture of left femur, subsequent encounter for closed fracture with routine healing: Secondary | ICD-10-CM | POA: Diagnosis not present

## 2017-12-26 DIAGNOSIS — S30860A Insect bite (nonvenomous) of lower back and pelvis, initial encounter: Secondary | ICD-10-CM | POA: Diagnosis not present

## 2017-12-26 DIAGNOSIS — Z882 Allergy status to sulfonamides status: Secondary | ICD-10-CM | POA: Diagnosis not present

## 2017-12-26 DIAGNOSIS — F0390 Unspecified dementia without behavioral disturbance: Secondary | ICD-10-CM | POA: Diagnosis present

## 2017-12-26 DIAGNOSIS — S72002D Fracture of unspecified part of neck of left femur, subsequent encounter for closed fracture with routine healing: Secondary | ICD-10-CM | POA: Diagnosis not present

## 2017-12-26 DIAGNOSIS — D62 Acute posthemorrhagic anemia: Secondary | ICD-10-CM | POA: Diagnosis not present

## 2017-12-26 DIAGNOSIS — M25552 Pain in left hip: Secondary | ICD-10-CM | POA: Diagnosis not present

## 2017-12-26 DIAGNOSIS — S62102D Fracture of unspecified carpal bone, left wrist, subsequent encounter for fracture with routine healing: Secondary | ICD-10-CM | POA: Diagnosis not present

## 2017-12-26 DIAGNOSIS — I10 Essential (primary) hypertension: Secondary | ICD-10-CM | POA: Diagnosis not present

## 2017-12-26 DIAGNOSIS — Y92009 Unspecified place in unspecified non-institutional (private) residence as the place of occurrence of the external cause: Secondary | ICD-10-CM | POA: Diagnosis not present

## 2017-12-26 DIAGNOSIS — N181 Chronic kidney disease, stage 1: Secondary | ICD-10-CM | POA: Diagnosis not present

## 2017-12-26 HISTORY — DX: Fracture of unspecified part of neck of left femur, initial encounter for closed fracture: S72.002A

## 2017-12-26 LAB — CBC WITH DIFFERENTIAL/PLATELET
BASOS PCT: 1 %
Basophils Absolute: 0 10*3/uL (ref 0–0.1)
EOS ABS: 0.1 10*3/uL (ref 0–0.7)
EOS PCT: 1 %
HEMATOCRIT: 29.4 % — AB (ref 35.0–47.0)
Hemoglobin: 10 g/dL — ABNORMAL LOW (ref 12.0–16.0)
Lymphocytes Relative: 11 %
Lymphs Abs: 0.8 10*3/uL — ABNORMAL LOW (ref 1.0–3.6)
MCH: 35.1 pg — ABNORMAL HIGH (ref 26.0–34.0)
MCHC: 34.1 g/dL (ref 32.0–36.0)
MCV: 102.9 fL — ABNORMAL HIGH (ref 80.0–100.0)
MONO ABS: 0.7 10*3/uL (ref 0.2–0.9)
MONOS PCT: 10 %
NEUTROS ABS: 6.1 10*3/uL (ref 1.4–6.5)
Neutrophils Relative %: 77 %
Platelets: 140 10*3/uL — ABNORMAL LOW (ref 150–440)
RBC: 2.85 MIL/uL — ABNORMAL LOW (ref 3.80–5.20)
RDW: 12.3 % (ref 11.5–14.5)
WBC: 7.8 10*3/uL (ref 3.6–11.0)

## 2017-12-26 LAB — BASIC METABOLIC PANEL
Anion gap: 9 (ref 5–15)
BUN: 27 mg/dL — ABNORMAL HIGH (ref 8–23)
CHLORIDE: 103 mmol/L (ref 98–111)
CO2: 24 mmol/L (ref 22–32)
Calcium: 9 mg/dL (ref 8.9–10.3)
Creatinine, Ser: 1.07 mg/dL — ABNORMAL HIGH (ref 0.44–1.00)
GFR calc non Af Amer: 44 mL/min — ABNORMAL LOW (ref >60)
GFR, EST AFRICAN AMERICAN: 51 mL/min — AB (ref >60)
Glucose, Bld: 106 mg/dL — ABNORMAL HIGH (ref 70–99)
POTASSIUM: 3.9 mmol/L (ref 3.5–5.1)
SODIUM: 136 mmol/L (ref 135–145)

## 2017-12-26 LAB — PROTIME-INR
INR: 1.48
Prothrombin Time: 17.8 seconds — ABNORMAL HIGH (ref 11.4–15.2)

## 2017-12-26 MED ORDER — MORPHINE SULFATE (PF) 2 MG/ML IV SOLN
0.5000 mg | INTRAVENOUS | Status: DC | PRN
  Administered 2017-12-26 – 2017-12-27 (×3): 0.5 mg via INTRAVENOUS
  Filled 2017-12-26 (×3): qty 1

## 2017-12-26 MED ORDER — POLYETHYLENE GLYCOL 3350 17 G PO PACK: 17.0000 g | PACK | Freq: Every day | ORAL | Status: DC | PRN

## 2017-12-26 MED ORDER — HYDROCODONE-ACETAMINOPHEN 5-325 MG PO TABS
1.0000 | ORAL_TABLET | Freq: Four times a day (QID) | ORAL | Status: DC | PRN
  Administered 2017-12-26 – 2017-12-31 (×3): 1 via ORAL
  Filled 2017-12-26 (×3): qty 1

## 2017-12-26 MED ORDER — BENAZEPRIL HCL 10 MG PO TABS
10.0000 mg | ORAL_TABLET | Freq: Every day | ORAL | Status: DC
  Administered 2017-12-28 – 2017-12-31 (×3): 10 mg via ORAL
  Filled 2017-12-26 (×5): qty 1

## 2017-12-26 MED ORDER — CEFAZOLIN (ANCEF) 1 G IV SOLR: 1.0000 g | INTRAVENOUS | Status: DC

## 2017-12-26 MED ORDER — SENNA 8.6 MG PO TABS
1.0000 | ORAL_TABLET | Freq: Two times a day (BID) | ORAL | Status: DC
  Administered 2017-12-26 – 2017-12-31 (×8): 8.6 mg via ORAL
  Filled 2017-12-26 (×10): qty 1

## 2017-12-26 MED ORDER — DOCUSATE SODIUM 100 MG PO CAPS
100.0000 mg | ORAL_CAPSULE | Freq: Every day | ORAL | Status: DC
  Filled 2017-12-26: qty 1

## 2017-12-26 MED ORDER — VERAPAMIL HCL ER 240 MG PO TBCR
240.0000 mg | EXTENDED_RELEASE_TABLET | Freq: Every day | ORAL | Status: DC
  Administered 2017-12-28 – 2017-12-31 (×3): 240 mg via ORAL
  Filled 2017-12-26 (×5): qty 1

## 2017-12-26 MED ORDER — CEFAZOLIN SODIUM-DEXTROSE 1-4 GM/50ML-% IV SOLN
1.0000 g | INTRAVENOUS | Status: AC
  Administered 2017-12-27: 1 g via INTRAVENOUS
  Filled 2017-12-26: qty 50

## 2017-12-26 MED ORDER — SODIUM CHLORIDE 0.9 % IV SOLN
INTRAVENOUS | Status: DC
Start: 2017-12-26 — End: 2017-12-29
  Administered 2017-12-26: 75 mL/h via INTRAVENOUS
  Administered 2017-12-27: 10:00:00 via INTRAVENOUS

## 2017-12-26 MED ORDER — VITAMIN D 1000 UNITS PO TABS
1000.0000 [IU] | ORAL_TABLET | Freq: Every day | ORAL | Status: DC
  Administered 2017-12-28 – 2017-12-31 (×4): 1000 [IU] via ORAL
  Filled 2017-12-26 (×5): qty 1

## 2017-12-26 MED ORDER — LORATADINE 10 MG PO TABS: 10.0000 mg | ORAL_TABLET | Freq: Every day | ORAL | Status: DC | PRN

## 2017-12-26 MED ORDER — BISACODYL 10 MG RE SUPP: 10.0000 mg | Freq: Every day | RECTAL | Status: DC | PRN

## 2017-12-26 NOTE — H&P (Addendum)
[facility] [place] [facility]   PATIENT NAME: Tami [name]    MR#:  [id number]  DATE OF BIRTH:  22-Mar-1924  DATE OF ADMISSION:  12/26/2017  PRIMARY CARE PHYSICIAN: Malva Limes, MD   REQUESTING/REFERRING PHYSICIAN: Dr Marisa Severin  CHIEF COMPLAINT:  patient brought into the emergency room by family after she had a mechanical fall at home. Impaired hearing. History obtained from daughter  HISTORY OF PRESENT ILLNESS:  Tami [name]  is a 84 y.o. female with a known history of to fibrillation on Xarelto, hyperlipidemia, hypertension comes to the emergency room from home after patient was standing near the window looking outside with daughter next to her and had a mechanical fall for etiology unclear. Patient started having hip pain. In the ER she was found to have left hip fracture along the left distal radial fracture. She was seen by Dr. Rosita Kea who plans on operating her tomorrow.  She does not taken her Xarelto today. Will keep it on hold. She denies any chest pain or shortness of breath. She is a bit hard on hearing.  PAST MEDICAL HISTORY:   Past Medical History:  Diagnosis Date  . Allergy   . Chicken pox   . Chronic kidney disease   . Hyperlipidemia   . Hypertension   . Measles   . Mumps   . Osteoarthritis   . Osteoporosis   . Periorbital cellulitis of left eye 01/04/2015  . Vitamin D deficiency     PAST SURGICAL HISTOIRY:   Past Surgical History:  Procedure Laterality Date  . ABDOMINAL HYSTERECTOMY    . APPENDECTOMY    . Excision of inferior parathyroid gland Left 05/25/2004   Dr. Lemar Livings  . Fracture of Fibula/Tibula Left   . OVARIAN CYST REMOVAL      SOCIAL HISTORY:   Social History   Tobacco Use  . Smoking status: Never Smoker  . Smokeless tobacco: Never Used  Substance Use Topics  . Alcohol use: No    Alcohol/week: 0.0 oz    FAMILY HISTORY:   Family History  Problem Relation Age of Onset  . Heart disease Mother   .  Hypertension Mother   . Hypertension Father   . Lung cancer Daughter     DRUG ALLERGIES:   Allergies  Allergen Reactions  . Maxzide  [Hydrochlorothiazide W-Triamterene]   . Sulfa Antibiotics     REVIEW OF SYSTEMS:  Review of Systems  Constitutional: Negative for chills, fever and weight loss.  HENT: Negative for ear discharge, ear pain and nosebleeds.   Eyes: Negative for blurred vision, pain and discharge.  Respiratory: Negative for sputum production, shortness of breath, wheezing and stridor.   Cardiovascular: Negative for chest pain, palpitations, orthopnea and PND.  Gastrointestinal: Negative for abdominal pain, diarrhea, nausea and vomiting.  Genitourinary: Negative for frequency and urgency.  Musculoskeletal: Positive for joint pain. Negative for back pain.  Neurological: Negative for sensory change, speech change, focal weakness and weakness.  Psychiatric/Behavioral: Negative for depression and hallucinations. The patient is not nervous/anxious.      MEDICATIONS AT HOME:   Prior to Admission medications   Medication Sig Start Date End Date Taking? Authorizing Provider  benazepril (LOTENSIN) 10 MG tablet TAKE ONE TABLET BY MOUTH DAILY 09/05/17   Malva Limes, MD  Cholecalciferol (VITAMIN D3) 2000 UNITS CHEW Chew 1 tablet by mouth daily. 01/20/15   Malva Limes, MD  docusate sodium (COLACE) 100 MG capsule Take 100 mg by mouth daily.  [provider]  loratadine (CLARITIN) 10 MG tablet Take by mouth daily.     [provider]  Multiple Vitamins-Minerals (PRESERVISION AREDS 2) CAPS Take 1 capsule by mouth 2 (two) times daily.    [provider]  ranitidine (ZANTAC) 150 MG tablet Take by mouth daily.     [provider]  Rivaroxaban (XARELTO) 15 MG TABS tablet Take 1 tablet (15 mg total) by mouth daily with supper. 12/05/17   Malva Limes, MD  verapamil (CALAN-SR) 240 MG CR tablet TAKE ONE TABLET BY MOUTH DAILY 10/28/17   Malva Limes, MD      VITAL SIGNS:  Blood pressure (!) 148/67, pulse 66, temperature 97.7 F (36.5 C), temperature source Oral, height 5\' 5"  (1.651 m), weight 53.5 kg (118 lb).  PHYSICAL EXAMINATION:  GENERAL:  84 y.o.-year-old patient lying in the bed with no acute distress. Thin cachectic EYES: Pupils equal, round, reactive to light and accommodation. No scleral icterus. Extraocular muscles intact.  HEENT: Head atraumatic, normocephalic. Oropharynx and nasopharynx clear.  NECK:  Supple, no jugular venous distention. No thyroid enlargement, no tenderness.  LUNGS: Normal breath sounds bilaterally, no wheezing, rales,rhonchi or crepitation. No use of accessory muscles of respiration.  CARDIOVASCULAR: S1, S2 normal. No murmurs, rubs, or gallops.  ABDOMEN: Soft, nontender, nondistended. Bowel sounds present. No organomegaly or mass.  EXTREMITIES: No pedal edema, cyanosis, or clubbing. Bruise over the left forearm and swelling, left hip restricted range of motion NEUROLOGIC: Cranial nerves II through XII are intact. Unable to perform due to hip fracture PSYCHIATRIC: The patient is alert and awake.  SKIN: No obvious rash, lesion, or ulcer.   LABORATORY PANEL:   CBC Recent Labs  Lab 12/26/17 1553  WBC 7.8  HGB 10.0*  HCT 29.4*  PLT 140*   ------------------------------------------------------------------------------------------------------------------  Chemistries  Recent Labs  Lab 12/26/17 1553  NA 136  K 3.9  CL 103  CO2 24  GLUCOSE 106*  BUN 27*  CREATININE 1.07*  CALCIUM 9.0   ------------------------------------------------------------------------------------------------------------------  Cardiac Enzymes No results for input(s): TROPONINI in the last 168 hours. ------------------------------------------------------------------------------------------------------------------  RADIOLOGY:  Dg Wrist Complete Left  Result Date: 12/26/2017 CLINICAL DATA:  Lost balance  with fall and left wrist pain, initial encounter EXAM: LEFT WRIST - COMPLETE 3+ VIEW COMPARISON:  None. FINDINGS: Comminuted distal radial fracture is noted with impaction and posterior displacement of the distal fracture fragments by approximately 1/2 bone width. No other fracture is identified. Soft tissue changes are noted. Degenerative changes at the first [facility] joint are seen. IMPRESSION: Distal radial fracture with impaction and posterior displacement. Electronically Signed   By: Alcide Clever M.D.   On: 12/26/2017 17:02   Dg Chest Portable 1 View  Result Date: 12/26/2017 CLINICAL DATA:  Patient fell today after losing her balance. The patient landed on her left side. EXAM: PORTABLE CHEST 1 VIEW COMPARISON:  No recent studies in [facility] FINDINGS: The lungs are well-expanded and clear. The heart is top-normal in size. The pulmonary vascularity is normal. The mediastinum is normal in width. There is calcification in the wall of the aortic arch. The observed bony thorax exhibits no acute abnormality. IMPRESSION: There is no evidence of acute post traumatic injury. Top-normal cardiac size without pulmonary vascular congestion. Thoracic aortic atherosclerosis. Electronically Signed   By: David  [place] M.D.   On: 12/26/2017 17:02   Dg Hip Unilat W Or Wo Pelvis 2-3 Views Left  Result Date: 12/26/2017 CLINICAL DATA:  Fall, left hip pain  EXAM: DG HIP (WITH OR WITHOUT PELVIS) 2-3V LEFT COMPARISON:  None. FINDINGS: There is an acute displaced left hip intertrochanteric fracture. Bones are osteopenic. Visualized pelvis and right hip intact. Mild gaseous distention of the bowel without obstruction noted. IMPRESSION: Acute displaced left hip intertrochanteric fracture. Osteopenia Electronically Signed   By: Judie PetitM.  Shick M.D.   On: 12/26/2017 17:03   Dg Femur Min 2 Views Left  Result Date: 12/26/2017 CLINICAL DATA:  The patient fell today landing on her left side. The patient reports left hip and leg pain. EXAM: LEFT  FEMUR 2 VIEWS COMPARISON:  None in PACs FINDINGS: The patient has sustained an acute intertrochanteric fracture of the left femur. The femoral head and neck appear intact. The observed portions of the left hemipelvis are normal. The sub trochanteric portions of the femur are unremarkable. The observed portions of the left knee exhibit no acute abnormalities. IMPRESSION: There is an acute mildly distracted and angulated intertrochanteric fracture of the left femur. Electronically Signed   By: David  [place]azil[place]Jordan M.D.   On: 12/26/2017 17:03    EKG:    IMPRESSION AND PLAN:   Vira BlancoLeona Bonanno  is a 84 y.o. female with a known history of to fibrillation on Xarelto, hyperlipidemia, hypertension comes to the emergency room from home after patient was standing near the window looking outside with daughter next to her and had a mechanical fall for etiology unclear. Patient started having hip pain. In the ER she was found to have left hip fracture along the left distal radial fracture.   1. left hip fracture and left distal radial fracture status post mechanical fall at home -did not lose consciousness according to the daughter -admit to orthopedic floor -IV fluids, PRN pain meds -patient was seen by orthopedic Dr. Rosita KeaMenz in the ER. Further surgical plans according to Dr. Rosita KeaMenz -patient is at a  intermediate risk for surgery. Family understands the risk and benefits of surgery.  2. history of atrial fibrillation on Xarelto -hold Xarelto for now  3. Hypertension continue home meds  4. DVT prophylaxis SCD and Ted's Xarelto can be resumed after surgery if okay with ortho   Above was discussed with patient's daughter in the ER     All the records are reviewed and case discussed with ED provider. Management plans discussed with the patient, family and they are in agreement.  CODE STATUS: full  TOTAL TIME TAKING CARE OF THIS PATIENT: *50* minutes.    Enedina FinnerSona Gable Odonohue M.D on 12/26/2017 at 5:58 PM  Between 7am  to 6pm - Pager - [phone]  After 6pm go to [email or web address] - password [email or web address]  [facility]  Office  [phone]  CC: Primary care physician; Malva LimesFisher, Donald E, MD

## 2017-12-26 NOTE — Progress Notes (Signed)
Family Meeting Note  Advance Directive:no  Today a meeting took place with the dter  Patient presented from home with family after she had a mechanical fall at home sustained of left hip fracture left distal radius fracture. She has history of a fib on Xarelto. She is otherwise active and independent. Uses a quad cane when she goes outside. Code status address with patient's daughter who is the healthcare power of attorney. She wants patient to be full code at present.  Time spent 16 mins Enedina FinnerSona Fabienne Nolasco, MD

## 2017-12-26 NOTE — ED Notes (Signed)
Pt transferred to floor at this time . 

## 2017-12-26 NOTE — ED Provider Notes (Signed)
[facility] Emergency Department Provider Note ____________________________________________   First MD Initiated Contact with Patient 12/26/17 1542     (approximate)  I have reviewed the triage vital signs and the nursing notes.   HISTORY  Chief Complaint Fall    HPI Tami [name] is a 84 y.o. female with PMH as noted below who presents with left hip and left wrist injuries after a mechanical fall from standing height in her home within the last few hours.  Patient denies hitting her head.  She denies any numbness or weakness, but is unable to ambulate.  She denies any prodrome or lightheadedness prior to the fall.  Past Medical History:  Diagnosis Date  . Allergy   . Chicken pox   . Chronic kidney disease   . Hyperlipidemia   . Hypertension   . Measles   . Mumps   . Osteoarthritis   . Osteoporosis   . Periorbital cellulitis of left eye 01/04/2015  . Vitamin D deficiency     Patient Active Problem List   Diagnosis Date Noted  . Macrocytic anemia 01/06/2015  . Atrial fibrillation (HCC) 01/06/2015  . Allergic rhinitis 01/04/2015  . Chronic kidney disease, stage 3 (HCC) 01/04/2015  . Cyst of eye 01/04/2015  . Hyperlipidemia 01/04/2015  . Liver cyst 01/04/2015  . Hypertension 01/04/2015  . Osteoarthritis 01/04/2015  . Osteoporosis 02/17/2014  . Vitamin D deficiency 02/01/2011    Past Surgical History:  Procedure Laterality Date  . ABDOMINAL HYSTERECTOMY    . APPENDECTOMY    . Excision of inferior parathyroid gland Left 05/25/2004   Dr. Lemar Livings  . Fracture of Fibula/Tibula Left   . OVARIAN CYST REMOVAL      Prior to Admission medications   Medication Sig Start Date End Date Taking? Authorizing Provider  benazepril (LOTENSIN) 10 MG tablet TAKE ONE TABLET BY MOUTH DAILY 09/05/17   Malva Limes, MD  Cholecalciferol (VITAMIN D3) 2000 UNITS CHEW Chew 1 tablet by mouth daily. 01/20/15   Malva Limes, MD  docusate sodium (COLACE) 100  MG capsule Take 100 mg by mouth daily.    [provider]  loratadine (CLARITIN) 10 MG tablet Take by mouth daily.     [provider]  Multiple Vitamins-Minerals (PRESERVISION AREDS 2) CAPS Take 1 capsule by mouth 2 (two) times daily.    [provider]  ranitidine (ZANTAC) 150 MG tablet Take by mouth daily.     [provider]  Rivaroxaban (XARELTO) 15 MG TABS tablet Take 1 tablet (15 mg total) by mouth daily with supper. 12/05/17   Malva Limes, MD  verapamil (CALAN-SR) 240 MG CR tablet TAKE ONE TABLET BY MOUTH DAILY 10/28/17   Malva Limes, MD    Allergies Maxzide  [hydrochlorothiazide w-triamterene] and Sulfa antibiotics  Family History  Problem Relation Age of Onset  . Heart disease Mother   . Hypertension Mother   . Hypertension Father   . Lung cancer Daughter     Social History Social History   Tobacco Use  . Smoking status: Never Smoker  . Smokeless tobacco: Never Used  Substance Use Topics  . Alcohol use: No    Alcohol/week: 0.0 oz  . Drug use: No    Review of Systems  Constitutional: No fever. Eyes: No redness. ENT: No neck pain. Cardiovascular: Denies chest pain. Respiratory: Denies shortness of breath. Gastrointestinal: No abdominal pain. Genitourinary: Negative for flank pain.  Musculoskeletal: Negative for back pain.  Positive for left  wrist and left hip injury. Skin: Negative for abrasions or lacerations. Neurological: Negative for headache.   ____________________________________________   PHYSICAL EXAM:  VITAL SIGNS: ED Triage Vitals  Enc Vitals Group     BP      Pulse      Resp      Temp      Temp src      SpO2      Weight      Height      Head Circumference      Peak Flow      Pain Score      Pain Loc      Pain Edu?      Excl. in GC?     Constitutional: Alert and oriented. Well appearing for age and in no acute distress. Eyes: Conjunctivae are normal.  Head: Atraumatic. Nose: No  congestion/rhinnorhea. Mouth/Throat: Mucous membranes are moist.   Neck: Normal range of motion.  No midline cervical spinal tenderness. Cardiovascular: Normal rate, regular rhythm. Grossly normal heart sounds.  Good peripheral circulation. Respiratory: Normal respiratory effort.  No retractions. Lungs CTAB. Gastrointestinal: Soft and nontender. No distention.  Genitourinary: No flank tenderness. Musculoskeletal: Left hip/proximal femur deformity.  FROM at left knee.  Left wrist deformity.  Distal pulses intact all extremities.  Extremities warm and well perfused.  Neurologic:  Normal speech and language.  Motor and sensory intact in all extremities.  No gross focal neurologic deficits are appreciated.  Skin:  Skin is warm and dry. No rash noted. Psychiatric: Mood and affect are normal. Speech and behavior are normal.  ____________________________________________   LABS (all labs ordered are listed, but only abnormal results are displayed)  Labs Reviewed  BASIC METABOLIC PANEL - Abnormal; Notable for the following components:      Result Value   Glucose, Bld 106 (*)    BUN 27 (*)    Creatinine, Ser 1.07 (*)    GFR calc non Af Amer 44 (*)    GFR calc Af Amer 51 (*)    All other components within normal limits  CBC WITH DIFFERENTIAL/PLATELET - Abnormal; Notable for the following components:   RBC 2.85 (*)    Hemoglobin 10.0 (*)    HCT 29.4 (*)    MCV 102.9 (*)    MCH 35.1 (*)    Platelets 140 (*)    Lymphs Abs 0.8 (*)    All other components within normal limits  PROTIME-INR - Abnormal; Notable for the following components:   Prothrombin Time 17.8 (*)    All other components within normal limits   ____________________________________________  EKG  ED ECG REPORT I, Dionne BucySebastian Juriel Cid, the attending physician, personally viewed and interpreted this ECG.  Date: 12/26/2017 EKG Time: 1846  Rate: 60 Rhythm: normal sinus rhythm QRS Axis: Right axis Intervals: normal ST/T  Wave abnormalities: normal Narrative Interpretation: no evidence of acute ischemia  ____________________________________________  RADIOLOGY  XR L hip: Intertrochanteric fracture XR L wrist: Distal radial fracture CXR: No infiltrate or other acute findings  ____________________________________________   PROCEDURES  Procedure(s) performed: No  Procedures  Critical Care performed: No ____________________________________________   INITIAL IMPRESSION / ASSESSMENT AND PLAN / ED COURSE  Pertinent labs & imaging results that were available during my care of the patient were reviewed by me and considered in my medical decision making (see chart for details).  84 year old female with PMH as noted above presents with left hip and left wrist deformities after a mechanical fall from standing height.  All extremities are neuro/vascular intact on exam.  She has no other apparent injuries.  She denies any lightheadedness or weakness leading to the fall.  We will obtain x-rays of the affected areas, as well as lab work-up as patient likely has fractures and will need to be admitted.  ----------------------------------------- 5:12 PM on 12/26/2017 -----------------------------------------  X-rays show left hip and left wrist fractures.  I consulted with Dr. Rosita Kea from orthopedics who evaluated the patient in the emergency department.  He requested that the patient be admitted to the hospitalist service and advised that he would likely operate on her tomorrow.  Xarelto will be held tonight.  I signed the patient out to the hospitalist Dr. Allena Katz.  ___________________________________________   FINAL CLINICAL IMPRESSION(S) / ED DIAGNOSES  Final diagnoses:  Closed fracture of left hip, initial encounter (HCC)  Closed fracture of left wrist, initial encounter      NEW MEDICATIONS STARTED DURING THIS VISIT:  New Prescriptions   No medications on file     Note:  This document was  prepared using Dragon voice recognition software and may include unintentional dictation errors.    Dionne Bucy, MD 12/26/17 [phone]

## 2017-12-26 NOTE — ED Triage Notes (Signed)
Pt ems from home s/p fall with LLE shortened and left wrist deformity. Pt with + pulses bilaterally.

## 2017-12-26 NOTE — Consult Note (Signed)
Reason for consult is left hip and wrist fracture  Patient's history is from family, they noted that she suffered a fall but she does not recall anything secondary to confusion. 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  On examination the left wrist has silver-fork deformity with ecchymosis.  Neurovascular exam is not reliable with her confusion and being very hard of hearing but brisk capillary refill and sensation appears intact Left lower extremity is shortened and externally rotated with no ecchymosis she is able to flex extend the toes  Radiographs: Displaced distal radius fracture with significant angulation.  Comminuted left intertrochanteric hip fracture.  Impression is 1 distal radius fracture with significant displacement, 2.  Left intertrochanteric hip fracture  Recommendation is for ORIF of both fractures to allow for mobilization use of a platform walker.  Hold her Xarelto preop and should be a do surgery tomorrow as she has not had Xarelto today

## 2017-12-27 ENCOUNTER — Encounter: Payer: Self-pay | Admitting: Anesthesiology

## 2017-12-27 ENCOUNTER — Inpatient Hospital Stay: Payer: Medicare Other | Admitting: Anesthesiology

## 2017-12-27 ENCOUNTER — Inpatient Hospital Stay: Payer: Medicare Other

## 2017-12-27 ENCOUNTER — Encounter
Admission: EM | Disposition: A | Discharge status: Skilled Nursing Facility | Payer: Self-pay | Source: Home / Self Care | Attending: Internal Medicine

## 2017-12-27 HISTORY — PX: ORIF WRIST FRACTURE: SHX2133

## 2017-12-27 HISTORY — PX: INTRAMEDULLARY (IM) NAIL INTERTROCHANTERIC: SHX5875

## 2017-12-27 LAB — SURGICAL PCR SCREEN
MRSA, PCR: NEGATIVE
STAPHYLOCOCCUS AUREUS: NEGATIVE

## 2017-12-27 SURGERY — OPEN REDUCTION INTERNAL FIXATION (ORIF) WRIST FRACTURE
Anesthesia: General | Laterality: Left

## 2017-12-27 MED ORDER — ONDANSETRON HCL 4 MG/2ML IJ SOLN: 4.0000 mg | Freq: Four times a day (QID) | INTRAMUSCULAR | Status: DC | PRN

## 2017-12-27 MED ORDER — MORPHINE SULFATE (PF) 2 MG/ML IV SOLN
2.0000 mg | INTRAVENOUS | Status: DC | PRN
  Administered 2017-12-27: 2 mg via INTRAVENOUS
  Filled 2017-12-27: qty 1

## 2017-12-27 MED ORDER — SUGAMMADEX SODIUM 200 MG/2ML IV SOLN
INTRAVENOUS | Status: DC | PRN
  Administered 2017-12-27: 105.2 mg via INTRAVENOUS

## 2017-12-27 MED ORDER — ACETAMINOPHEN 500 MG PO TABS
500.0000 mg | ORAL_TABLET | Freq: Four times a day (QID) | ORAL | Status: AC
  Administered 2017-12-27 – 2017-12-28 (×4): 500 mg via ORAL
  Filled 2017-12-27 (×4): qty 1

## 2017-12-27 MED ORDER — RIVAROXABAN 15 MG PO TABS
15.0000 mg | ORAL_TABLET | Freq: Every day | ORAL | Status: DC
  Administered 2017-12-28 – 2017-12-30 (×3): 15 mg via ORAL
  Filled 2017-12-27 (×4): qty 1

## 2017-12-27 MED ORDER — MORPHINE SULFATE (PF) 2 MG/ML IV SOLN
0.5000 mg | INTRAVENOUS | Status: DC | PRN
  Administered 2017-12-28: 0.5 mg via INTRAVENOUS
  Filled 2017-12-27: qty 1

## 2017-12-27 MED ORDER — METHOCARBAMOL 500 MG PO TABS: 500.0000 mg | ORAL_TABLET | Freq: Four times a day (QID) | ORAL | Status: DC | PRN

## 2017-12-27 MED ORDER — ONDANSETRON HCL 4 MG/2ML IJ SOLN
INTRAMUSCULAR | Status: DC | PRN
  Administered 2017-12-27: 4 mg via INTRAVENOUS

## 2017-12-27 MED ORDER — METOCLOPRAMIDE HCL 10 MG PO TABS: 5.0000 mg | ORAL_TABLET | Freq: Three times a day (TID) | ORAL | Status: DC | PRN

## 2017-12-27 MED ORDER — DOCUSATE SODIUM 100 MG PO CAPS
100.0000 mg | ORAL_CAPSULE | Freq: Two times a day (BID) | ORAL | Status: DC
Start: 2017-12-27 — End: 2017-12-31
  Administered 2017-12-27 – 2017-12-31 (×7): 100 mg via ORAL
  Filled 2017-12-27 (×8): qty 1

## 2017-12-27 MED ORDER — ONDANSETRON HCL 4 MG PO TABS: 4.0000 mg | ORAL_TABLET | Freq: Four times a day (QID) | ORAL | Status: DC | PRN

## 2017-12-27 MED ORDER — METHOCARBAMOL 1000 MG/10ML IJ SOLN
500.0000 mg | Freq: Four times a day (QID) | INTRAVENOUS | Status: DC | PRN
  Filled 2017-12-27: qty 5

## 2017-12-27 MED ORDER — ACETAMINOPHEN 10 MG/ML IV SOLN
INTRAVENOUS | Status: DC | PRN
  Administered 2017-12-27: 1000 mg via INTRAVENOUS

## 2017-12-27 MED ORDER — CEFAZOLIN SODIUM-DEXTROSE 1-4 GM/50ML-% IV SOLN
INTRAVENOUS | Status: AC
  Filled 2017-12-27: qty 50

## 2017-12-27 MED ORDER — PHENOL 1.4 % MT LIQD
1.0000 | OROMUCOSAL | Status: DC | PRN
  Filled 2017-12-27: qty 177

## 2017-12-27 MED ORDER — CEFAZOLIN SODIUM-DEXTROSE 1-4 GM/50ML-% IV SOLN
1.0000 g | Freq: Four times a day (QID) | INTRAVENOUS | Status: AC
  Administered 2017-12-27 – 2017-12-28 (×3): 1 g via INTRAVENOUS
  Filled 2017-12-27 (×3): qty 50

## 2017-12-27 MED ORDER — FENTANYL CITRATE (PF) 100 MCG/2ML IJ SOLN
INTRAMUSCULAR | Status: DC | PRN
  Administered 2017-12-27 (×2): 25 ug via INTRAVENOUS
  Administered 2017-12-27: 50 ug via INTRAVENOUS

## 2017-12-27 MED ORDER — MAGNESIUM CITRATE PO SOLN
1.0000 | Freq: Once | ORAL | Status: AC | PRN
  Administered 2017-12-28: 1 via ORAL
  Filled 2017-12-27: qty 296

## 2017-12-27 MED ORDER — HYDROCODONE-ACETAMINOPHEN 7.5-325 MG PO TABS
1.0000 | ORAL_TABLET | ORAL | Status: DC | PRN
  Administered 2017-12-28: 1 via ORAL
  Filled 2017-12-27: qty 1

## 2017-12-27 MED ORDER — PROPOFOL 10 MG/ML IV BOLUS
INTRAVENOUS | Status: DC | PRN
  Administered 2017-12-27: 100 mg via INTRAVENOUS

## 2017-12-27 MED ORDER — SODIUM CHLORIDE 0.9 % IV SOLN
INTRAVENOUS | Status: DC | PRN
  Administered 2017-12-27: 10 ug/min via INTRAVENOUS

## 2017-12-27 MED ORDER — ACETAMINOPHEN 10 MG/ML IV SOLN
INTRAVENOUS | Status: AC
  Filled 2017-12-27: qty 100

## 2017-12-27 MED ORDER — SODIUM CHLORIDE 0.9 % IV SOLN
INTRAVENOUS | Status: DC
  Administered 2017-12-27: 20:00:00 via INTRAVENOUS

## 2017-12-27 MED ORDER — ROCURONIUM BROMIDE 100 MG/10ML IV SOLN
INTRAVENOUS | Status: DC | PRN
  Administered 2017-12-27: 30 mg via INTRAVENOUS

## 2017-12-27 MED ORDER — PHENYLEPHRINE HCL 10 MG/ML IJ SOLN
INTRAMUSCULAR | Status: DC | PRN
  Administered 2017-12-27 (×2): 100 ug via INTRAVENOUS

## 2017-12-27 MED ORDER — NEOMYCIN-POLYMYXIN B GU 40-200000 IR SOLN
Status: DC | PRN
  Administered 2017-12-27: 2 mL

## 2017-12-27 MED ORDER — ACETAMINOPHEN 325 MG PO TABS
325.0000 mg | ORAL_TABLET | Freq: Four times a day (QID) | ORAL | Status: DC | PRN
  Administered 2017-12-30 – 2017-12-31 (×4): 650 mg via ORAL
  Filled 2017-12-27 (×4): qty 2

## 2017-12-27 MED ORDER — METOCLOPRAMIDE HCL 5 MG/ML IJ SOLN: 5.0000 mg | Freq: Three times a day (TID) | INTRAMUSCULAR | Status: DC | PRN

## 2017-12-27 MED ORDER — ONDANSETRON HCL 4 MG/2ML IJ SOLN: 4.0000 mg | Freq: Once | INTRAMUSCULAR | Status: DC | PRN

## 2017-12-27 MED ORDER — MENTHOL 3 MG MT LOZG
1.0000 | LOZENGE | OROMUCOSAL | Status: DC | PRN
  Filled 2017-12-27: qty 9

## 2017-12-27 MED ORDER — LACTATED RINGERS IV SOLN
INTRAVENOUS | Status: DC | PRN
  Administered 2017-12-27: 13:00:00 via INTRAVENOUS

## 2017-12-27 MED ORDER — ALUM & MAG HYDROXIDE-SIMETH 200-200-20 MG/5ML PO SUSP: 30.0000 mL | ORAL | Status: DC | PRN

## 2017-12-27 MED ORDER — FENTANYL CITRATE (PF) 100 MCG/2ML IJ SOLN
INTRAMUSCULAR | Status: AC
  Filled 2017-12-27: qty 2

## 2017-12-27 MED ORDER — ZOLPIDEM TARTRATE 5 MG PO TABS: 5.0000 mg | ORAL_TABLET | Freq: Every evening | ORAL | Status: DC | PRN

## 2017-12-27 MED ORDER — FENTANYL CITRATE (PF) 100 MCG/2ML IJ SOLN: 25.0000 ug | INTRAMUSCULAR | Status: DC | PRN

## 2017-12-27 MED ORDER — LIDOCAINE HCL (CARDIAC) PF 100 MG/5ML IV SOSY
PREFILLED_SYRINGE | INTRAVENOUS | Status: DC | PRN
  Administered 2017-12-27: 50 mg via INTRAVENOUS

## 2017-12-27 MED ORDER — TRAMADOL HCL 50 MG PO TABS
50.0000 mg | ORAL_TABLET | Freq: Four times a day (QID) | ORAL | Status: DC
  Administered 2017-12-27 – 2017-12-29 (×7): 50 mg via ORAL
  Filled 2017-12-27 (×7): qty 1

## 2017-12-27 MED ORDER — MAGNESIUM HYDROXIDE 400 MG/5ML PO SUSP: 30.0000 mL | Freq: Every day | ORAL | Status: DC | PRN

## 2017-12-27 SURGICAL SUPPLY — 58 items
BANDAGE ACE 4X5 VEL STRL LF (GAUZE/BANDAGES/DRESSINGS) ×3 IMPLANT
BANDAGE ELASTIC 4 LF NS (GAUZE/BANDAGES/DRESSINGS) ×3 IMPLANT
BIT DRILL 2 FAST STEP (BIT) ×3 IMPLANT
BIT DRILL CANN LG 4.3MM (BIT) ×1 IMPLANT
CANISTER SUCT 1200ML W/VALVE (MISCELLANEOUS) ×3 IMPLANT
CHLORAPREP W/TINT 26ML (MISCELLANEOUS) ×3 IMPLANT
CUFF TOURN 18 STER (MISCELLANEOUS) ×3 IMPLANT
DRAPE FLUOR MINI C-ARM 54X84 (DRAPES) ×3 IMPLANT
DRAPE SHEET LG 3/4 BI-LAMINATE (DRAPES) ×3 IMPLANT
DRAPE U-SHAPE 47X51 STRL (DRAPES) ×3 IMPLANT
DRILL BIT CANN LG 4.3MM (BIT) ×3
DRIVER PEG 2.0 FAST (BIT) ×3 IMPLANT
DRSG OPSITE POSTOP 3X4 (GAUZE/BANDAGES/DRESSINGS) ×9 IMPLANT
ELECT CAUTERY BLADE 6.4 (BLADE) ×3 IMPLANT
ELECT REM PT RETURN 9FT ADLT (ELECTROSURGICAL) ×3
ELECTRODE REM PT RTRN 9FT ADLT (ELECTROSURGICAL) ×1 IMPLANT
GAUZE PETRO XEROFOAM 1X8 (MISCELLANEOUS) ×3 IMPLANT
GAUZE SPONGE 4X4 12PLY STRL (GAUZE/BANDAGES/DRESSINGS) ×3 IMPLANT
GLOVE BIOGEL PI IND STRL 9 (GLOVE) ×1 IMPLANT
GLOVE BIOGEL PI INDICATOR 9 (GLOVE) ×2
GLOVE SURG SYN 9.0  PF PI (GLOVE) ×2
GLOVE SURG SYN 9.0 PF PI (GLOVE) ×1 IMPLANT
GOWN SRG 2XL LVL 4 RGLN SLV (GOWNS) ×1 IMPLANT
GOWN STRL NON-REIN 2XL LVL4 (GOWNS) ×2
GOWN STRL REUS W/ TWL LRG LVL3 (GOWN DISPOSABLE) ×1 IMPLANT
GOWN STRL REUS W/TWL LRG LVL3 (GOWN DISPOSABLE) ×2
GUIDEPIN VERSANAIL DSP 3.2X444 (ORTHOPEDIC DISPOSABLE SUPPLIES) ×3 IMPLANT
K-WIRE 1.6 (WIRE) ×2
K-WIRE FX5X1.6XNS BN SS (WIRE) ×1
KIT TURNOVER KIT A (KITS) ×3 IMPLANT
KWIRE FX5X1.6XNS BN SS (WIRE) ×1 IMPLANT
MAT BLUE FLOOR 46X72 FLO (MISCELLANEOUS) ×3 IMPLANT
NAIL HIP FRACT 130D 11X180 (Screw) ×3 IMPLANT
NEEDLE FILTER BLUNT 18X 1/2SAF (NEEDLE) ×2
NEEDLE FILTER BLUNT 18X1 1/2 (NEEDLE) ×1 IMPLANT
NS IRRIG 500ML POUR BTL (IV SOLUTION) ×3 IMPLANT
PACK EXTREMITY ARMC (MISCELLANEOUS) ×3 IMPLANT
PACK HIP COMPR (MISCELLANEOUS) ×3 IMPLANT
PAD CAST CTTN 4X4 STRL (SOFTGOODS) ×1 IMPLANT
PADDING CAST COTTON 4X4 STRL (SOFTGOODS) ×2
PEG SUBCHONDRAL SMOOTH 2.0X14 (Peg) ×3 IMPLANT
PEG SUBCHONDRAL SMOOTH 2.0X20 (Peg) ×12 IMPLANT
PEG SUBCHONDRAL SMOOTH 2.0X22 (Peg) ×3 IMPLANT
PLATE SHORT 21.6X48.9 NRRW LT (Plate) ×3 IMPLANT
SCALPEL PROTECTED #15 DISP (BLADE) ×6 IMPLANT
SCREW BONE CORTICAL 5.0X42 (Screw) ×3 IMPLANT
SCREW CORT 3.5X10 LNG (Screw) ×9 IMPLANT
SCREW LAG 10.5MMX105MM HFN (Screw) ×3 IMPLANT
SPLINT CAST 1 STEP 3X12 (MISCELLANEOUS) ×3 IMPLANT
STAPLER SKIN PROX 35W (STAPLE) ×3 IMPLANT
SUT ETHILON 4-0 (SUTURE) ×2
SUT ETHILON 4-0 FS2 18XMFL BLK (SUTURE) ×1
SUT VIC AB 1 CT1 36 (SUTURE) ×3 IMPLANT
SUT VIC AB 2-0 CT1 (SUTURE) ×3 IMPLANT
SUT VICRYL 3-0 27IN (SUTURE) ×3 IMPLANT
SUTURE ETHLN 4-0 FS2 18XMF BLK (SUTURE) ×1 IMPLANT
SYR 10ML LL (SYRINGE) ×3 IMPLANT
SYR 3ML LL SCALE MARK (SYRINGE) ×3 IMPLANT

## 2017-12-27 NOTE — Clinical Social Work Note (Signed)
Clinical Social Work Assessment  Patient Details  Name: Tami [name] MRN: [id number] Date of Birth: 05-12-1924  Date of referral:  12/27/17               Reason for consult:  Facility Placement                Permission sought to share information with:  Chartered certified accountant granted to share information::  Yes, Verbal Permission Granted  Name::      [place]::   [place]   Relationship::     Contact Information:     Housing/Transportation Living arrangements for the past 2 months:  [place] of Information:  Patient, Adult Children Patient Interpreter Needed:  None Criminal Activity/Legal Involvement Pertinent to Current Situation/Hospitalization:  No - Comment as needed Significant Relationships:  Adult Children Lives with:  Adult Children Do you feel safe going back to the place where you live?  Yes Need for family participation in patient care:  Yes (Comment)  Care giving concerns:  Patient lives in [place] with her daughter Tami [name] [phone].    Social Worker assessment / plan:  Holiday representative ([place]) reviewed chart and noted that patient has a hip fracture. Surgery and PT are pending. CSW met with patient alone at bedside. Patient was hard of hearing and was alert and oriented X3. CSW introduced self and explained role of CSW department. Per patient she lives with her daughter Tami [name]. Per patient Tami [name] will make the decisions about her discharge plan. CSW met with patient's daughter Tami [name] outside of the room and explained that PT will evaluate patient and make a recommendation of home health or SNF. Per daughter patient will need SNF. CSW explained that [facility] will have to approve SNF. CSW also discussed medicaid and long term care options. Daughter is agreeable to SNF search in [place]. FL2 complete and faxed out. CSW will continue to follow and assist as needed.   Employment status:   Retired Nurse, adult PT Recommendations:  Not assessed at this time [place] / Referral to community resources:  [place]  Patient/Family's Response to care:  Patient and her daughter are agreeable to SNF search in [place].   Patient/Family's Understanding of and Emotional Response to Diagnosis, Current Treatment, and Prognosis:  Patient and her daughter were very pleasant and thanked CSW for assistance.   Emotional Assessment Appearance:  Appears stated age Attitude/Demeanor/Rapport:    Affect (typically observed):  Pleasant Orientation:  Oriented to Self, Oriented to Place, Oriented to  Time, Fluctuating Orientation (Suspected and/or reported Sundowners) Alcohol / Substance use:  Not Applicable Psych involvement (Current and /or in the community):  No (Comment)  Discharge Needs  Concerns to be addressed:  Discharge Planning Concerns Readmission within the last 30 days:  No Current discharge risk:  Dependent with Mobility Barriers to Discharge:  Continued Medical Work up   [employer], Veronia Beets, LCSW 12/27/2017, 12:27 PM

## 2017-12-27 NOTE — Transfer of Care (Signed)
Immediate Anesthesia Transfer of Care Note  Patient: Jarold MottoLeona D Figley  Procedure(s) Performed: OPEN REDUCTION INTERNAL FIXATION (ORIF) WRIST FRACTURE (Left ) INTRAMEDULLARY (IM) NAIL INTERTROCHANTRIC- AFFIXUS (Left )  Patient Location: PACU  Anesthesia Type:General  Level of Consciousness: awake, alert  and oriented  Airway & Oxygen Therapy: Patient Spontanous Breathing and Patient connected to nasal cannula oxygen  Post-op Assessment: Report given to RN and Post -op Vital signs reviewed and stable  Post vital signs: stable  Last Vitals:  Vitals Value Taken Time  BP 136/79 12/27/2017  2:50 PM  Temp    Pulse 78 12/27/2017  2:48 PM  Resp 17 12/27/2017  2:50 PM  SpO2 100 % 12/27/2017  2:48 PM  Vitals shown include unvalidated device data.  Last Pain:  Vitals:   12/27/17 1237  TempSrc: Oral  PainSc: 3       Patients Stated Pain Goal: 2 (12/27/17 1014)  Complications: No apparent anesthesia complications

## 2017-12-27 NOTE — Progress Notes (Signed)
[facility] [place] [facility]   PATIENT NAME: Tami BlancoLeona Justus    MR#:  [id number]  DATE OF BIRTH:  06/19/1923  SUBJECTIVE: Patient admitted for fall, found to have left hip fracture, left radial fracture.  Scheduled for surgery today.  Patient is hard of hearing and also has some confusion and baseline dementia.  CHIEF COMPLAINT:   Chief Complaint  Patient presents with  . Fall  Alert but not oriented to place and time.  REVIEW OF SYSTEMS:   Review of Systems  Unable to perform ROS: Dementia   CONSTITUTIONAL: No fever, fatigue or  DRUG ALLERGIES:   Allergies  Allergen Reactions  . Maxzide  [Hydrochlorothiazide W-Triamterene]   . Sulfa Antibiotics     VITALS:  Blood pressure 137/77, pulse 84, temperature 98 F (36.7 C), temperature source Oral, height 5\' 5"  (1.651 m), weight 52.6 kg (116 lb), SpO2 100 %.  PHYSICAL EXAMINATION:  GENERAL:  84 y.o.-year-old patient lying in the bed with no acute distress.  pleasantly confused.  EYES: Pupils equal, round, reactive to light and accommodation. No scleral icterus.  HEENT: Head atraumatic, normocephalic. Oropharynx and nasopharynx clear.  NECK:  Supple, no jugular venous distention. No thyroid enlargement, no tenderness.  LUNGS: Normal breath sounds bilaterally, no wheezing, rales,rhonchi or crepitation. No use of accessory muscles of respiration.  CARDIOVASCULAR: S1, S2 normal. No murmurs, rubs, or gallops.  ABDOMEN: Soft, nontender, nondistended. Bowel sounds present. No organomegaly or mass.  EXTREMITIES: Running, external rotation of the left leg, patient also has ACe wrap for left leg NEUROLOGIC: Cranial nerves II through XII are intact. Muscle strength 5/5 in all extremities. Sensation intact. Gait not checked.  PSYCHIATRIC: Patient is awake but not oriented to time, place or person because of dementia  sKIN: No obvious rash, lesion, or ulcer.    LABORATORY PANEL:   CBC Recent Labs  Lab  12/26/17 1553  WBC 7.8  HGB 10.0*  HCT 29.4*  PLT 140*   ------------------------------------------------------------------------------------------------------------------  Chemistries  Recent Labs  Lab 12/26/17 1553  NA 136  K 3.9  CL 103  CO2 24  GLUCOSE 106*  BUN 27*  CREATININE 1.07*  CALCIUM 9.0   ------------------------------------------------------------------------------------------------------------------  Cardiac Enzymes No results for input(s): TROPONINI in the last 168 hours. ------------------------------------------------------------------------------------------------------------------  RADIOLOGY:  Dg Wrist Complete Left  Result Date: 12/26/2017 CLINICAL DATA:  Lost balance with fall and left wrist pain, initial encounter EXAM: LEFT WRIST - COMPLETE 3+ VIEW COMPARISON:  None. FINDINGS: Comminuted distal radial fracture is noted with impaction and posterior displacement of the distal fracture fragments by approximately 1/2 bone width. No other fracture is identified. Soft tissue changes are noted. Degenerative changes at the first [facility] are seen. IMPRESSION: Distal radial fracture with impaction and posterior displacement. Electronically Signed   By: Alcide CleverMark  Lukens M.D.   On: 12/26/2017 17:02   Dg Chest Portable 1 View  Result Date: 12/26/2017 CLINICAL DATA:  Patient fell today after losing her balance. The patient landed on her left side. EXAM: PORTABLE CHEST 1 VIEW COMPARISON:  No recent studies [facility] FINDINGS: The lungs are well-expanded and clear. The heart is top-normal in size. The pulmonary vascularity is normal. The mediastinum is normal in width. There is calcification in the wall of the aortic arch. The observed bony thorax exhibits no acute abnormality. IMPRESSION: There is no evidence of acute post traumatic injury. Top-normal cardiac size without pulmonary vascular congestion. Thoracic aortic atherosclerosis. Electronically Signed   By: Onalee Huaavid  [place]  M.D.   On: 12/26/2017 17:02   Dg Hip Unilat W Or Wo Pelvis 2-3 Views Left  Result Date: 12/26/2017 CLINICAL DATA:  Fall, left hip pain EXAM: DG HIP (WITH OR WITHOUT PELVIS) 2-3V LEFT COMPARISON:  None. FINDINGS: There is an acute displaced left hip intertrochanteric fracture. Bones are osteopenic. Visualized pelvis and right hip intact. Mild gaseous distention of the bowel without obstruction noted. IMPRESSION: Acute displaced left hip intertrochanteric fracture. Osteopenia Electronically Signed   By: Judie Petit.  Shick M.D.   On: 12/26/2017 17:03   Dg Femur Min 2 Views Left  Result Date: 12/26/2017 CLINICAL DATA:  The patient fell today landing on her left side. The patient reports left hip and leg pain. EXAM: LEFT FEMUR 2 VIEWS COMPARISON:  None in PACs FINDINGS: The patient has sustained an acute intertrochanteric fracture of the left femur. The femoral head and neck appear intact. The observed portions of the left hemipelvis are normal. The sub trochanteric portions of the femur are unremarkable. The observed portions of the left knee exhibit no acute abnormalities. IMPRESSION: There is an acute mildly distracted and angulated intertrochanteric fracture of the left femur. Electronically Signed   By: David  [place] M.D.   On: 12/26/2017 17:03    EKG:   Orders placed or performed during the hospital encounter of 12/26/17  . ED EKG  . ED EKG  . EKG 12-Lead  . EKG 12-Lead    ASSESSMENT AND PLAN:   84 year old female patient with attention, hyperlipidemia, dementia brought in because of mechanical fall at home and found to have left hip fracture, left radial fracture.  His chronic atrial fibrillation and she is taking Xarelto for that last dose was day before yesterday.  Patient is scheduled to have surgery today.  Hold Xarelto. #1 left hip fracture, left distal radial fracture due to mechanical fall at home: Seen by Dr. Rosita Kea, patient is scheduled for surgery today.  Continue with pain medicines and also  restart DVT prophylaxis after surgery. 2.  Chronic atrial fibrillation: Rate controlled, c hold Xarelto, continue verapamil 240 mg p.o. daily.  Hypertension: Controlled, continue verapamil, benazepril. Is n.p.o. for left hip surgery by orthopedic today so she is on IV fluids.  Will discuss with patient daughter again about CODE STATUS.  All the records are reviewed and case discussed with Care Management/Social Workerr. Management plans discussed with the patient, family and they are in agreement.  CODE STATUS: full code TOTAL TIME TAKING CARE OF THIS PATIENT: 38 minutes.   POSSIBLE D/C IN 1-2 DAYS, DEPENDING ON CLINICAL CONDITION.   Katha Hamming M.D on 12/27/2017 at 8:29 AM  Between 7am to 6pm - Pager - [phone]  After 6pm go to [email or web address] - password [email or web address]  [facility]  Office  [phone]  CC: Primary care physician; Malva Limes, MD   Note: This dictation was prepared with Dragon dictation along with smaller phrase technology. Any transcriptional errors that result from this process are unintentional.

## 2017-12-27 NOTE — Progress Notes (Signed)
Patient is resting comfortably in bed with a splint to the left arm.  Left leg is flexed and externally rotated with no ecchymosis.  Patient is confused but does not appear to be in any discomfort while sitting still in bed plan is for ORIF of wrist and hip later today.  Sites marked

## 2017-12-27 NOTE — Anesthesia Postprocedure Evaluation (Signed)
Anesthesia Post Note  Patient: Jarold MottoLeona D Yakel  Procedure(s) Performed: OPEN REDUCTION INTERNAL FIXATION (ORIF) WRIST FRACTURE (Left ) INTRAMEDULLARY (IM) NAIL INTERTROCHANTRIC- AFFIXUS (Left )  Patient location during evaluation: PACU Anesthesia Type: General Level of consciousness: awake and alert Pain management: pain level controlled Vital Signs Assessment: post-procedure vital signs reviewed and stable Respiratory status: spontaneous breathing, nonlabored ventilation, respiratory function stable and patient connected to nasal cannula oxygen Cardiovascular status: blood pressure returned to baseline and stable Postop Assessment: no apparent nausea or vomiting Anesthetic complications: no     Last Vitals:  Vitals:   12/27/17 1755 12/27/17 1913  BP: (!) 145/68 (!) 141/70  Pulse: 88 96  Resp:    Temp: 36.9 C   SpO2: 100% 99%    Last Pain:  Vitals:   12/27/17 1755  TempSrc: Oral  PainSc:                  Kareli Hossain S

## 2017-12-27 NOTE — Anesthesia Procedure Notes (Signed)
Procedure Name: Intubation Date/Time: 12/27/2017 1:13 PM Performed by: Willette Alma, CRNA Pre-anesthesia Checklist: Patient identified, Patient being monitored, Timeout performed, Emergency Drugs available and Suction available Patient Re-evaluated:Patient Re-evaluated prior to induction Oxygen Delivery Method: Circle system utilized Preoxygenation: Pre-oxygenation with 100% oxygen Induction Type: IV induction Ventilation: Mask ventilation without difficulty Laryngoscope Size: Mac and 3 Grade View: Grade I Tube type: Oral Tube size: 7.0 mm Number of attempts: 1 Airway Equipment and Method: Stylet Placement Confirmation: ETT inserted through vocal cords under direct vision,  positive ETCO2 and breath sounds checked- equal and bilateral Secured at: 21 cm Tube secured with: Tape Dental Injury: Teeth and Oropharynx as per pre-operative assessment  Comments: Atraumatic intubation. Lips and teeth as before.

## 2017-12-27 NOTE — Plan of Care (Signed)

## 2017-12-27 NOTE — Progress Notes (Signed)
PT Cancellation Note  Patient Details Name: Tami [name] MRN: [id number] DOB: 1924-02-23   Cancelled Treatment:    Reason Eval/Treat Not Completed: Medical issues which prohibited therapy(Consult received and chart reviewed.  Patient noted with plans for surgical intervention to L  hip, L wrist this date.  Will hold PT evaluation until procedure complete and patient cleared for activity.)  Nefertari Rebman H. Manson PasseyBrown, PT, DPT, NCS 12/27/17, 7:06 AM [phone]

## 2017-12-27 NOTE — Progress Notes (Signed)
Clinical Social Worker (CSW) met with patient and her daughter [name] was at bedside. CSW presented bed offers. They chose [facility]. Per [facility] admissions coordinator at [facility] she will start [facility] SNF authorization.   [employer], LCSW [phone]

## 2017-12-27 NOTE — Clinical Social Work Placement (Signed)
   CLINICAL SOCIAL WORK PLACEMENT  NOTE  Date:  12/27/2017  Patient Details  Name: Tami [name] MRN: [id number] Date of Birth: 05/04/1924  Clinical Social Work is seeking post-discharge placement for this patient at the Skilled  Nursing Facility level of care (*CSW will initial, date and re-position this form in  chart as items are completed):  Yes   Patient/family provided with [facility] Clinical Social Work Department's list of facilities offering this level of care within the geographic area requested by the patient (or if unable, by the patient's family).  Yes   Patient/family informed of their freedom to choose among providers that offer the needed level of care, that participate in Medicare, Medicaid or managed care program needed by the patient, have an available bed and are willing to accept the patient.  Yes   Patient/family informed of [facility]'s ownership interest in [facility] and [facility], as well as of the fact that they are under no obligation to receive care at these facilities.  PASRR submitted to EDS on 12/27/17     PASRR number received on 12/27/17     Existing PASRR number confirmed on       FL2 transmitted to all facilities in geographic area requested by pt/family on 12/27/17     FL2 transmitted to all facilities within larger geographic area on       Patient informed that his/her managed care company has contracts with or will negotiate with certain facilities, including the following:            Patient/family informed of bed offers received.  Patient chooses bed at       Physician recommends and patient chooses bed at      Patient to be transferred to   on  .  Patient to be transferred to facility by       Patient family notified on   of transfer.  Name of family member notified:        PHYSICIAN       Additional Comment:    _______________________________________________ Griselda Tosh, Darleen CrockerBailey M, LCSW 12/27/2017, 12:27 PM

## 2017-12-27 NOTE — Anesthesia Preprocedure Evaluation (Signed)
Anesthesia Evaluation  Patient identified by MRN, date of birth, ID band Patient awake    Reviewed: Allergy & Precautions, NPO status , Patient's Chart, lab work & pertinent test results, reviewed documented beta blocker date and time   Airway Mallampati: II  TM Distance: >3 FB     Dental  (+) Chipped   Pulmonary           Cardiovascular hypertension, Pt. on medications + Peripheral Vascular Disease  + dysrhythmias Atrial Fibrillation      Neuro/Psych    GI/Hepatic   Endo/Other    Renal/GU Renal disease     Musculoskeletal  (+) Arthritis ,   Abdominal   Peds  Hematology  (+) anemia ,   Anesthesia Other Findings EKG irregular. Does not show Afib now. Some confusion, according to daughter. INR 1.48.  Reproductive/Obstetrics                             Anesthesia Physical Anesthesia Plan  ASA: III  Anesthesia Plan: General   Post-op Pain Management:    Induction: Intravenous  PONV Risk Score and Plan:   Airway Management Planned: Oral ETT  Additional Equipment:   Intra-op Plan:   Post-operative Plan:   Informed Consent: I have reviewed the patients History and Physical, chart, labs and discussed the procedure including the risks, benefits and alternatives for the proposed anesthesia with the patient or authorized representative who has indicated his/her understanding and acceptance.     Plan Discussed with: CRNA  Anesthesia Plan Comments:         Anesthesia Quick Evaluation

## 2017-12-27 NOTE — NC FL2 (Signed)
MEDICAID FL2 LEVEL OF CARE SCREENING TOOL     IDENTIFICATION  Patient Name: Tami MottoLeona D Pizano Birthdate: Jun 15, 1923 Sex: female Admission Date (Current Location): 12/26/2017  [place]   Facility and Address:  [facility], [address], [place], [place] [address]      Provider Number: [id number]  Attending Physician Name and Address:  Katha HammingKonidena, Snehalatha, MD  Relative Name and Phone Number:       Current Level of Care: Hospital Recommended Level of Care: Skilled Nursing Facility Prior Approval Number:    Date Approved/Denied:   PASRR Number: [id number])  Discharge Plan: SNF    Current Diagnoses: Patient Active Problem List   Diagnosis Date Noted  . Hip fracture (HCC) 12/26/2017  . Macrocytic anemia 01/06/2015  . Atrial fibrillation (HCC) 01/06/2015  . Allergic rhinitis 01/04/2015  . Chronic kidney disease, stage 3 (HCC) 01/04/2015  . Cyst of eye 01/04/2015  . Hyperlipidemia 01/04/2015  . Liver cyst 01/04/2015  . Hypertension 01/04/2015  . Osteoarthritis 01/04/2015  . Osteoporosis 02/17/2014  . Vitamin D deficiency 02/01/2011    Orientation RESPIRATION BLADDER Height & Weight     Self, Time, Situation, Place  Normal Continent Weight: 52.6 kg (116 lb) Height:  5\' 5"  (165.1 cm)  BEHAVIORAL SYMPTOMS/MOOD NEUROLOGICAL BOWEL NUTRITION STATUS      Continent Diet(Diet: NPO for surgery to be advanced. )  AMBULATORY STATUS COMMUNICATION OF NEEDS Skin   Extensive Assist Verbally Surgical wounds                       Personal Care Assistance Level of Assistance  Bathing, Feeding, Dressing Bathing Assistance: Limited assistance Feeding assistance: Independent Dressing Assistance: Limited assistance     Functional Limitations Info  Sight, Hearing, Speech Sight Info: Impaired Hearing Info: Impaired Speech Info: Adequate    SPECIAL CARE FACTORS FREQUENCY  PT (By licensed PT), OT (By  licensed OT)     PT Frequency: (5) OT Frequency: (5)            Contractures      Additional Factors Info  Code Status, Allergies Code Status Info: (Full Code. ) Allergies Info: (Maxzide  Hydrochlorothiazide W-triamterene, Sulfa Antibiotics)           Current Medications (12/27/2017):  This is the current hospital active medication list Current Facility-Administered Medications  Medication Dose Route Frequency Provider Last Rate Last Dose  . 0.9 %  sodium chloride infusion   Intravenous Continuous Enedina FinnerPatel, Sona, MD 75 mL/hr at 12/26/17 2026 75 mL/hr at 12/26/17 2026  . benazepril (LOTENSIN) tablet 10 mg  10 mg Oral Daily Enedina FinnerPatel, Sona, MD      . bisacodyl (DULCOLAX) suppository 10 mg  10 mg Rectal Daily PRN Enedina FinnerPatel, Sona, MD      . ceFAZolin (ANCEF) IVPB 1 g/50 mL premix  1 g Intravenous To OR Kennedy BuckerMenz, Michael, MD      . cholecalciferol (VITAMIN D) tablet 1,000 Units  1,000 Units Oral Daily Enedina FinnerPatel, Sona, MD      . docusate sodium (COLACE) capsule 100 mg  100 mg Oral Daily Enedina FinnerPatel, Sona, MD      . HYDROcodone-acetaminophen (NORCO/VICODIN) 5-325 MG per tablet 1-2 tablet  1-2 tablet Oral Q6H PRN Enedina FinnerPatel, Sona, MD   1 tablet at 12/26/17 2141  . loratadine (CLARITIN) tablet 10 mg  10 mg Oral Daily PRN Enedina FinnerPatel, Sona, MD      . morphine 2 MG/ML injection 0.5 mg  0.5 mg Intravenous Q2H PRN Enedina Finner, MD   0.5 mg at 12/27/17 0933  . polyethylene glycol (MIRALAX / GLYCOLAX) packet 17 g  17 g Oral Daily PRN Enedina Finner, MD      . senna (SENOKOT) tablet 8.6 mg  1 tablet Oral BID Enedina Finner, MD   8.6 mg at 12/26/17 2141  . verapamil (CALAN-SR) CR tablet 240 mg  240 mg Oral Daily Enedina Finner, MD         Discharge Medications: Please see discharge summary for a list of discharge medications.  Relevant Imaging Results:  Relevant Lab Results:   Additional Information (SSN: [id number])  Kambrey Hagger, Darleen Crocker, LCSW

## 2017-12-27 NOTE — Anesthesia Post-op Follow-up Note (Signed)
Anesthesia QCDR form completed.        

## 2017-12-27 NOTE — Op Note (Signed)
12/27/2017  2:48 PM  PATIENT:  Tami [name]  84 y.o. female  PRE-OPERATIVE DIAGNOSIS:  LEFT WRIST FRACTURE, 2 part.  ,LEFT HIP FRACTURE intertrochanteric  POST-OPERATIVE DIAGNOSIS: Same  PROCEDURE:  Procedure(s): OPEN REDUCTION INTERNAL FIXATION (ORIF) WRIST FRACTURE (Left) INTRAMEDULLARY (IM) NAIL INTERTROCHANTRIC- AFFIXUS (Left)  SURGEON: Leitha Schuller, MD  ASSISTANTS: None  ANESTHESIA:   general  EBL:  Total I/O In: 350 [I.V.:350] Out: 1300 [Urine:1200; Blood:100]  BLOOD ADMINISTERED:none  DRAINS: none   LOCAL MEDICATIONS USED:  NONE  SPECIMEN:  No Specimen  DISPOSITION OF SPECIMEN:  N/A  COUNTS:  YES  TOURNIQUET:  * Missing tourniquet times found for documented tourniquets in log: [id number] *  IMPLANTS: [employer] DVR short narrow left plate with multiple smooth pegs and screws, 11 mm short affixes rod with 105 leg screw and 42 mm interlocking screw  DICTATION: .Dragon Dictation patient was brought to the operating room and after adequate anesthesia was obtained the patient was transferred to the fracture table.  After getting the left foot in the traction boot and applying traction with internal rotation serum was brought in and anatomic alignment obtained.  The hip was prepped and draped using a barrier drape method.  After patient identification and timeout procedures were completed, a small incision was made at the proximal greater trochanter and a guidewire inserted into the tip of the trochanter and center position on the lateral.  Proximal reaming was carried out and the short rod was inserted to the appropriate depth.  A small lateral incision made and a guidewire inserted into a center center position with measurement of the leg screw drilling carried out and 105 leg screw inserted.  The compression device was applied and traction released with correct traction off the compression of the fracture occurred and the set screw was placed proximally tightening it  and then a quarter turn loosening to allow for further compression.  Next the distal interlocking screw was placed through the drill sleeve through the dynamic screw hole drilling measuring and placing a 42 mm 5.0 screw.  The insertion handle was removed and the x-rays were taken AP distally and AP lateral proximally showing anatomic alignment.  The wound wound was irrigated and the deep closure with #1 Vicryl 2-0 Vicryl subcutaneous layer followed by skin staples and honeycomb dressings applied.  The foot was taken out of the traction boot and and an arm board placed after removing a splint from the left arm.  The arm was prepped and draped you sterile fashion and after appropriate repeat patient identification timeout procedure the tourniquet was raised and a volar incision made centered over the FCR tendon.  The tendon was retracted radially to protect the artery and veins and the deep fascia incised with retraction of the muscle the fracture was exposed with quite a bit of soft tissue stripping off the distal and proximal fragments.  With traction applied off the end of the table length was restored and with a Therapist, nutritional length was further corrected with still some dorsal angulation with a dorsal angulation of the distal first approach was utilized with the plate applied pinned into position was in the appropriate position distal screw holes were filled with smooth pegs.  The plate was then brought to the shaft with 310 mm screws and there is essentially anatomic alignment.  Traction was released and under fluoroscopic views the fracture appeared stable with passive range of motion with no penetration into the joint.  The wound was  irrigated and tourniquet let down wound was closed with 3-0 Vicryl subcutaneously and 4 -0 nylon skin sutures.  Xeroform 4 x 4's web roll volar splint and Ace wrap applied  PLAN OF CARE: Continue as inpatient  PATIENT DISPOSITION:  PACU - hemodynamically stable.

## 2017-12-28 ENCOUNTER — Telehealth: Payer: Self-pay

## 2017-12-28 ENCOUNTER — Encounter: Payer: Self-pay | Admitting: Orthopedic Surgery

## 2017-12-28 LAB — CBC
HCT: 20.9 % — ABNORMAL LOW (ref 35.0–47.0)
HEMOGLOBIN: 7.2 g/dL — AB (ref 12.0–16.0)
MCH: 35.2 pg — ABNORMAL HIGH (ref 26.0–34.0)
MCHC: 34.3 g/dL (ref 32.0–36.0)
MCV: 102.7 fL — ABNORMAL HIGH (ref 80.0–100.0)
Platelets: 101 10*3/uL — ABNORMAL LOW (ref 150–440)
RBC: 2.04 MIL/uL — AB (ref 3.80–5.20)
RDW: 12.4 % (ref 11.5–14.5)
WBC: 7.7 10*3/uL (ref 3.6–11.0)

## 2017-12-28 LAB — BASIC METABOLIC PANEL
ANION GAP: 6 (ref 5–15)
BUN: 21 mg/dL (ref 8–23)
CHLORIDE: 105 mmol/L (ref 98–111)
CO2: 24 mmol/L (ref 22–32)
Calcium: 8.1 mg/dL — ABNORMAL LOW (ref 8.9–10.3)
Creatinine, Ser: 0.91 mg/dL (ref 0.44–1.00)
GFR calc non Af Amer: 53 mL/min — ABNORMAL LOW (ref >60)
Glucose, Bld: 109 mg/dL — ABNORMAL HIGH (ref 70–99)
POTASSIUM: 3.8 mmol/L (ref 3.5–5.1)
SODIUM: 135 mmol/L (ref 135–145)

## 2017-12-28 LAB — HEMOGLOBIN AND HEMATOCRIT, BLOOD
HEMATOCRIT: 26.6 % — AB (ref 35.0–47.0)
Hemoglobin: 9.1 g/dL — ABNORMAL LOW (ref 12.0–16.0)

## 2017-12-28 LAB — ABO/RH: ABO/RH(D): B POS

## 2017-12-28 LAB — PREPARE RBC (CROSSMATCH)

## 2017-12-28 MED ORDER — DOXYCYCLINE HYCLATE 100 MG PO TABS: 100.0000 mg | ORAL_TABLET | Freq: Two times a day (BID) | ORAL | Status: DC

## 2017-12-28 MED ORDER — SODIUM CHLORIDE 0.9% IV SOLUTION: Freq: Once | INTRAVENOUS | Status: DC

## 2017-12-28 MED ORDER — DOXYCYCLINE HYCLATE 100 MG PO TABS
100.0000 mg | ORAL_TABLET | Freq: Two times a day (BID) | ORAL | Status: DC
  Administered 2017-12-29 – 2017-12-31 (×4): 100 mg via ORAL
  Filled 2017-12-28 (×4): qty 1

## 2017-12-28 NOTE — Telephone Encounter (Signed)
Patient is requesting to pick up records or documentation saying that she has Dementia or Alzheimer's disease. She is trying to get help from social services to become the patient's legal guardian. She is requesting that she pick this up today if possible.   She also mentions that the patient is currently at the hospital.

## 2017-12-28 NOTE — Progress Notes (Addendum)
   Subjective: 1 Day Post-Op Procedure(s) (LRB): OPEN REDUCTION INTERNAL FIXATION (ORIF) WRIST FRACTURE (Left) INTRAMEDULLARY (IM) NAIL INTERTROCHANTRIC- AFFIXUS (Left) Patient reports pain as mild.  States she slept well last night. Patient is well, and has had no acute complaints or problems Denies any CP, SOB, ABD pain. We will continue therapy today.   Objective: Vital signs in last 24 hours: Temp:  [96.5 F (35.8 C)-98.6 F (37 C)] 97.9 F (36.6 C) (07/26 0619) Pulse Rate:  [76-96] 89 (07/26 0619) Resp:  [15-22] 18 (07/25 2000) BP: (102-157)/(63-85) 122/63 (07/26 0619) SpO2:  [99 %-100 %] 100 % (07/26 0619)  Intake/Output from previous day: 07/25 0701 - 07/26 0700 In: 470 [P.O.:120; I.V.:350] Out: 1400 [Urine:1300; Blood:100] Intake/Output this shift: Total I/O In: 1100 [I.V.:1100] Out: 350 [Urine:350]  Recent Labs    12/26/17 1553  HGB 10.0*   Recent Labs    12/26/17 1553  WBC 7.8  RBC 2.85*  HCT 29.4*  PLT 140*   Recent Labs    12/26/17 1553  NA 136  K 3.9  CL 103  CO2 24  BUN 27*  CREATININE 1.07*  GLUCOSE 106*  CALCIUM 9.0   Recent Labs    12/26/17 1553  INR 1.48    EXAM General - Patient is Alert and Confused Left lower extremity - Neurovascular intact Sensation intact distally Intact pulses distally Dorsiflexion/Plantar flexion intact No cellulitis present Compartment soft  Left upper extremity: Patient is in volar splint with Ace wrap applied.  Minimal swelling throughout the digits.  Sensation is intact distally.  Patient has performing passive digit range of motion exercises on exam.  She is able to tolerate passive range of motion exercises well without pain. Dressing - dressing C/D/I and no drainage Motor Function - intact, moving foot and toes well on exam.   Past Medical History:  Diagnosis Date  . Allergy   . Chicken pox   . Chronic kidney disease   . Hyperlipidemia   . Hypertension   . Measles   . Mumps   .  Osteoarthritis   . Osteoporosis   . Periorbital cellulitis of left eye 01/04/2015  . Vitamin D deficiency     Assessment/Plan:   1 Day Post-Op Procedure(s) (LRB): OPEN REDUCTION INTERNAL FIXATION (ORIF) WRIST FRACTURE (Left) INTRAMEDULLARY (IM) NAIL INTERTROCHANTRIC- AFFIXUS (Left) Active Problems:   Hip fracture (HCC)   Acute post op blood loss anemia   Estimated body mass index is 19.3 kg/m as calculated from the following:   Height as of this encounter: 5\' 5"  (1.651 m).   Weight as of this encounter: 52.6 kg (116 lb). Advance diet Up with therapy, weightbearing as tolerated left lower extremity, nonweightbearing left upper extremity.  Recommend platform walker Needs bowel movement. Acute post op blood loss anemia - Hgb 7.2 transfuse 1 unit of PRBC and recheck Hgb tomorrow am  Follow up with [facility] in 2 weeks for left hip staple removal and left wrist suture removal. Keep left wrist splint on at all times TED hose BLE x 6 weeks, remove at night  DVT Prophylaxis - Foot Pumps, TED hose and Xarelto    T. Cranston Neighborhris Kerrigan Gombos, PA-C [facility] 12/28/2017, 7:52 AM

## 2017-12-28 NOTE — Progress Notes (Signed)
[facility] is pending. Patient's daughter Okey DupreRose is aware of above.   [employer] Inc[employer]Bailey Jordyan Hardiman, LCSW [phone]

## 2017-12-28 NOTE — Progress Notes (Signed)
Physical Therapy Treatment Patient Details Name: Tami [name] MRN: [id number] DOB: 1923-07-16 Today's Date: 12/28/2017    History of Present Illness Pt is a 84 y.o. female with a known history of to fibrillation on Xarelto, hyperlipidemia, hypertension who came to the emergency room from home after patient was standing near the window looking outside with daughter next to her and had a mechanical fall with etiology unclear.  Patient started having hip pain. In the ER she was found to have left hip fracture and a left distal radius fracture.  Pt is now s/p IM nailing to the L hip and ORIF to the L distal radius.      PT Comments    Pt presents with deficits in strength, transfers, mobility, gait, balance, and activity tolerance.  Pt continues to require extensive assistance with all functional mobility tasks along with max verbal and tactile cues for sequencing and to ensure WB compliance to the L wrist.  Pt will benefit from PT services in a SNF setting upon discharge to safely address above deficits for decreased caregiver assistance and eventual return to PLOF.     Follow Up Recommendations  SNF;Supervision for mobility/OOB     Equipment Recommendations  Other (comment)(TBD at next venue of care)    Recommendations for Other Services       Precautions / Restrictions Precautions Precautions: Fall Restrictions Weight Bearing Restrictions: Yes LUE Weight Bearing: Non weight bearing LLE Weight Bearing: Weight bearing as tolerated Other Position/Activity Restrictions: OK to use LUE platform walker, NWB through the L wrist    Mobility  Bed Mobility Overal bed mobility: Needs Assistance Bed Mobility: Sit to Supine     Supine to sit: Mod assist Sit to supine: Max assist   General bed mobility comments: Max A for BLEs and upper body into bed, Max verbal cues to avoid WB/pulling through the L wrist during bed mobility tasks  Transfers Overall transfer level: Needs  assistance Equipment used: Left platform walker Transfers: Sit to/from Stand Sit to Stand: +2 safety/equipment;Mod assist         General transfer comment: Max verbal and tactile cues to avoid WB through the L wrist and for general sequencing during transfer training   Ambulation/Gait Ambulation/Gait assistance: Min assist;+2 safety/equipment Gait Distance (Feet): 2 Feet Assistive device: Left platform walker Gait Pattern/deviations: Step-to pattern;Decreased step length - right;Decreased stance time - left;Trunk flexed;Antalgic Gait velocity: Decreased   General Gait Details: Pt antalgic on the L with difficulty advancing RLE   Stairs             Wheelchair Mobility    Modified Rankin (Stroke Patients Only)       Balance Overall balance assessment: Needs assistance   Sitting balance-Leahy Scale: Good     Standing balance support: Bilateral upper extremity supported Standing balance-Leahy Scale: Poor                              Cognition Arousal/Alertness: Awake/alert Behavior During Therapy: WFL for tasks assessed/performed Overall Cognitive Status: Within Functional Limits for tasks assessed                                        Exercises Total Joint Exercises Ankle Circles/Pumps: AROM;Both;10 reps Quad Sets: Strengthening;Both;10 reps Heel Slides: AROM;Right;5 reps Hip ABduction/ADduction: AROM;AAROM;5 reps;Both(AAROM on the L) Straight Leg Raises: AROM;5  reps;AAROM;Both(AAROM on the L) Long Arc Quad: AROM;Both;10 reps;5 reps Knee Flexion: AROM;Both;5 reps;10 reps    General Comments        Pertinent Vitals/Pain Pain Assessment: 0-10 Pain Score: 0-No pain Pain Location: No pain at rest but limited by pain in the L hip with WB Pain Intervention(s): Monitored during session;Limited activity within patient's tolerance;Premedicated before session    Home Living Family/patient expects to be discharged to:: Private  residence(History from daughter via phone call) Living Arrangements: Children Available Help at Discharge: Family;Available 24 hours/day Type of Home: House Home Access: Stairs to enter Entrance Stairs-Rails: Left;Right Home Layout: One level Home Equipment: Cane - quad      Prior Function Level of Independence: Independent with assistive device(s)      Comments: Ind Amb in the home without an AD, QC for outdoor ambulation, Ind with ADLs, one other fall in the last year   PT Goals (current goals can now be found in the care plan section) Acute Rehab PT Goals Patient Stated Goal: To walk better PT Goal Formulation: With patient Time For Goal Achievement: 01/10/18 Potential to Achieve Goals: Good    Frequency    BID      PT Plan Current plan remains appropriate    Co-evaluation              AM-PAC PT "6 Clicks" Daily Activity  Outcome Measure  Difficulty turning over in bed (including adjusting bedclothes, sheets and blankets)?: Unable Difficulty moving from lying on back to sitting on the side of the bed? : Unable Difficulty sitting down on and standing up from a chair with arms (e.g., wheelchair, bedside commode, etc,.)?: Unable Help needed moving to and from a bed to chair (including a wheelchair)?: A Lot Help needed walking in hospital room?: Total Help needed climbing 3-5 steps with a railing? : Total 6 Click Score: 7    End of Session Equipment Utilized During Treatment: Gait belt Activity Tolerance: Patient limited by pain Patient left: in bed;with nursing/sitter in room;Other (comment)(Nursing in room preparing pt for transfusion at end of session) Nurse Communication: Mobility status PT Visit Diagnosis: Unsteadiness on feet (R26.81);Muscle weakness (generalized) (M62.81);Other abnormalities of gait and mobility (R26.89)     Time: [id number] PT Time Calculation (min) (ACUTE ONLY): 31 min  Charges:  $Therapeutic Exercise: 8-22 mins $Therapeutic  Activity: 8-22 mins                     D. Scott Atharv Barriere PT, DPT 12/28/17, 3:12 PM

## 2017-12-28 NOTE — Telephone Encounter (Signed)
She doesn't have a diagnosis of dementia. Recommend she request a pych evaluation while she is in the hospital.

## 2017-12-28 NOTE — Telephone Encounter (Signed)
The patient's daughter Tami Dupre([name]) is requesting the information. She is currently at the hospital with her mother, and could come by today to pick this up.   The patient's daughter is trying to become the legal guardian and needs this documentation. Her contact info is correct.   Disregard message below.

## 2017-12-28 NOTE — Care Management Important Message (Signed)
Important Message  Patient Details  Name: Tami [name] MRN: [id number] Date of Birth: 1924-04-06   Medicare Important Message Given:  Yes    [name] 12/28/2017, 3:18 PM

## 2017-12-28 NOTE — Plan of Care (Signed)

## 2017-12-28 NOTE — Progress Notes (Signed)
[facility] - [place] at [facility]   PATIENT NAME: Tami [name]    MR#:  [id number]  DATE OF BIRTH:  [birth year]  SUBJECTIVE: Patient admitted for fall, found to have left hip fracture, left radial fracture.  Scheduled for surgery today.  Patient is hard of hearing and also has some confusion and baseline dementia.  CHIEF COMPLAINT:   Chief Complaint  Patient presents with  . Fall  Alert but not oriented to place and time given her baseline dementia.  Hemoglobin dropped down to 7.2  REVIEW OF SYSTEMS:   Review of Systems  Unable to perform ROS: Dementia    DRUG ALLERGIES:   Allergies  Allergen Reactions  . Maxzide  [Hydrochlorothiazide W-Triamterene]   . Sulfa Antibiotics     VITALS:  Blood pressure (!) 141/57, pulse 85, temperature 97.9 F (36.6 C), temperature source Oral, resp. rate 18, height 5\' 5"  (1.651 m), weight 52.6 kg (116 lb), SpO2 100 %.  PHYSICAL EXAMINATION:  GENERAL:  84 y.o.-year-old patient lying in the bed with no acute distress.  pleasantly confused.  EYES: Pupils equal, round, reactive to light and accommodation. No scleral icterus.  HEENT: Head atraumatic, normocephalic. Oropharynx and nasopharynx clear.  NECK:  Supple, no jugular venous distention. No thyroid enlargement, no tenderness.  LUNGS: Normal breath sounds bilaterally, no wheezing, rales,rhonchi or crepitation. No use of accessory muscles of respiration.  CARDIOVASCULAR: S1, S2 normal. No murmurs, rubs, or gallops.  ABDOMEN: Soft, nontender, nondistended. Bowel sounds present.   EXTREMITIES: Left wrist and left hip in clean dressing NEUROLOGIC:Patient is awake but not oriented to time, place or person because of dementia  sKIN: No obvious rash, lesion, or ulcer.    LABORATORY PANEL:   CBC Recent Labs  Lab 12/28/17 0726 12/28/17 1810  WBC 7.7  --   HGB 7.2* 9.1*  HCT 20.9* 26.6*  PLT 101*  --     ------------------------------------------------------------------------------------------------------------------  Chemistries  Recent Labs  Lab 12/28/17 0726  NA 135  K 3.8  CL 105  CO2 24  GLUCOSE 109*  BUN 21  CREATININE 0.91  CALCIUM 8.1*   ------------------------------------------------------------------------------------------------------------------  Cardiac Enzymes No results for input(s): TROPONINI in the last 168 hours. ------------------------------------------------------------------------------------------------------------------  RADIOLOGY:  Dg Wrist 2 Views Left  Result Date: 12/27/2017 CLINICAL DATA:  Postop wrist surgery EXAM: LEFT WRIST - 2 VIEW COMPARISON:  12/26/2017 FINDINGS: Plate and screw fixation across the distal left radial fracture. Near anatomic alignment. No hardware bony complicating feature. IMPRESSION: Internal fixation across the left distal radial fracture. No complicating feature. Electronically Signed   By: Charlett Nose M.D.   On: 12/27/2017 19:29   Dg C-arm 1-60 Min  Result Date: 12/27/2017 CLINICAL DATA:  Intraoperative imaging for left proximal femur fracture reduction. EXAM: DG C-ARM 61-120 MIN; LEFT FEMUR 2 VIEWS COMPARISON:  12/26/2017. FINDINGS: Portable imaging shows placement of a compression screw and intramedullary rod reducing the intertrochanteric proximal femur fracture components into near anatomic alignment. The orthopedic hardware is well-seated. No evidence acute fracture or operative complication. IMPRESSION: 1. Successful reduction of the intertrochanteric left proximal femur fracture following ORIF Electronically Signed   By: Amie Portland M.D.   On: 12/27/2017 14:12   Dg Femur Min 2 Views Left  Result Date: 12/27/2017 CLINICAL DATA:  Intraoperative imaging for left proximal femur fracture reduction. EXAM: DG C-ARM 61-120 MIN; LEFT FEMUR 2 VIEWS COMPARISON:  12/26/2017. FINDINGS: Portable imaging shows placement of a  compression screw and intramedullary rod reducing the intertrochanteric proximal  femur fracture components into near anatomic alignment. The orthopedic hardware is well-seated. No evidence acute fracture or operative complication. IMPRESSION: 1. Successful reduction of the intertrochanteric left proximal femur fracture following ORIF Electronically Signed   By: Amie Portlandavid  Ormond M.D.   On: 12/27/2017 14:12    EKG:   Orders placed or performed during the hospital encounter of 12/26/17  . ED EKG  . ED EKG  . EKG 12-Lead  . EKG 12-Lead    ASSESSMENT AND PLAN:   84 year old female patient with attention, hyperlipidemia, dementia brought in because of mechanical fall at home and found to have left hip fracture, left radial fracture.  His chronic atrial fibrillation and she is taking Xarelto for that last dose was day before yesterday.     #1 left hip fracture, left distal radial fracture due to mechanical fall at home: Seen by Dr. Rosita KeaMenz,  patient had surgery of left hip and left hand 12/27/2017 .  Continue with pain medicines as needed   follow-up with Ortho  #Acute blood loss anemia from surgery  hemoglobin trended down from 10.0.0-7.2 Status post 1 unit of blood transfusion hemoglobin 9.1 following transfusion,  #  Chronic atrial fibrillation: Rate controlled, resume Xarelto, continue verapamil 240 mg p.o. Daily.  #.Hypertension: Controlled, continue verapamil, benazepril. . Disposition skilled nursing facility  Will discuss with patient daughter again about CODE STATUS.  All the records are reviewed and case discussed with Care Management/Social Workerr. Management plans discussed with the patient, family and they are in agreement.  CODE STATUS: full code TOTAL TIME TAKING CARE OF THIS PATIENT: 38 minutes.   POSSIBLE D/C IN 1-2 DAYS, DEPENDING ON CLINICAL CONDITION.   Ramonita LabAruna Emanuelle Bastos M.D on 12/28/2017 at 9:52 PM  Between 7am to 6pm - Pager - [phone]  After 6pm go to  [email or web address] - password [email or web address]  Fabio Neighborsagle [place] [facility]  Office  [phone]  CC: Primary care physician; Malva LimesFisher, Donald E, MD   Note: This dictation was prepared with Dragon dictation along with smaller phrase technology. Any transcriptional errors that result from this process are unintentional.

## 2017-12-28 NOTE — Evaluation (Signed)
Physical Therapy Evaluation Patient Details Name: Tami [name] MRN: [id number] DOB: July 22, 1923 Today's Date: 12/28/2017   History of Present Illness  Pt is a 84 y.o. female with a known history of to fibrillation on Xarelto, hyperlipidemia, hypertension who came to the emergency room from home after patient was standing near the window looking outside with daughter next to her and had a mechanical fall with etiology unclear.  Patient started having hip pain. In the ER she was found to have left hip fracture and a left distal radius fracture.  Pt is now s/p IM nailing to the L hip and ORIF to the L distal radius.  Of note pt is very HOH and per daughter is close to legally blind.    Clinical Impression  Pt presents with deficits in strength, transfers, mobility, gait, balance, and activity tolerance.  Pt required extensive physical assistance with bed mobility and transfers along with max verbal and tactile cues to prevent using her L wrist to assist with tasks.  Once in standing pt was very antalgic through the LLE with significant difficulty advancing her RLE and was only able to take several very small steps.  Overall pt is very limited functionally and is at a very high risk for falls.  Pt will benefit from PT services in a SNF setting upon discharge to safely address above deficits for decreased caregiver assistance and eventual return to PLOF.     Follow Up Recommendations SNF;Supervision for mobility/OOB    Equipment Recommendations  Other (comment)(TBD at next venue of care)    Recommendations for Other Services       Precautions / Restrictions Precautions Precautions: Fall Restrictions Weight Bearing Restrictions: Yes LUE Weight Bearing: Non weight bearing LLE Weight Bearing: Weight bearing as tolerated Other Position/Activity Restrictions: OK to use LUE platform walker, NWB through the L wrist      Mobility  Bed Mobility Overal bed mobility: Needs Assistance Bed  Mobility: Supine to Sit;Sit to Supine     Supine to sit: Mod assist Sit to supine: Mod assist   General bed mobility comments: Mod A for BLEs and upper body in and out of bed, Max verbal cues to avoid WB/pulling through the L wrist during bed mobility tasks  Transfers Overall transfer level: Needs assistance Equipment used: Left platform walker Transfers: Sit to/from Stand Sit to Stand: +2 safety/equipment;Mod assist         General transfer comment: Max verbal cues to avoid WB through the L wrist during transfer training  Ambulation/Gait Ambulation/Gait assistance: Min assist;+2 safety/equipment Gait Distance (Feet): 2 Feet Assistive device: Left platform walker Gait Pattern/deviations: Step-to pattern;Decreased step length - right;Decreased stance time - left;Trunk flexed;Antalgic Gait velocity: Decreased   General Gait Details: Pt antalgic on the L with difficulty advancing RLE  Stairs            Wheelchair Mobility    Modified Rankin (Stroke Patients Only)       Balance Overall balance assessment: Needs assistance   Sitting balance-Leahy Scale: Good     Standing balance support: Bilateral upper extremity supported Standing balance-Leahy Scale: Poor                               Pertinent Vitals/Pain Pain Assessment: No/denies pain    Home Living Family/patient expects to be discharged to:: Private residence(History from daughter via phone call) Living Arrangements: Children Available Help at Discharge: Family;Available 24 hours/day Type of  Home: House Home Access: Stairs to enter Entrance Stairs-Rails: LawyerLeft;Right Entrance Stairs-Number of Steps: 5 Home Layout: One level Home Equipment: Cane - quad      Prior Function Level of Independence: Independent with assistive device(s)         Comments: Ind Amb in the home without an AD, QC for outdoor ambulation, Ind with ADLs, one other fall in the last year     Hand Dominance         Extremity/Trunk Assessment   Upper Extremity Assessment Upper Extremity Assessment: Generalized weakness;LUE deficits/detail LUE: Unable to fully assess due to immobilization    Lower Extremity Assessment Lower Extremity Assessment: Generalized weakness;LLE deficits/detail LLE: Unable to fully assess due to pain       Communication   Communication: HOH;Other (comment)(Near legally blind per daughter)  Cognition Arousal/Alertness: Awake/alert Behavior During Therapy: WFL for tasks assessed/performed Overall Cognitive Status: No family/caregiver present to determine baseline cognitive functioning                                        General Comments      Exercises Total Joint Exercises Ankle Circles/Pumps: AROM;Both;10 reps Quad Sets: Strengthening;Both;10 reps Heel Slides: AROM;Right;10 reps Hip ABduction/ADduction: AROM;Right;10 reps Straight Leg Raises: AROM;Right;10 reps Long Arc Quad: AROM;Both;10 reps Knee Flexion: AROM;Both;10 reps   Assessment/Plan    PT Assessment Patient needs continued PT services  PT Problem List Decreased strength;Decreased activity tolerance;Decreased balance;Decreased mobility;Decreased knowledge of use of DME       PT Treatment Interventions DME instruction;Gait training;Functional mobility training;Therapeutic activities;Therapeutic exercise;Balance training;Patient/family education    PT Goals (Current goals can be found in the Care Plan section)  Acute Rehab PT Goals Patient Stated Goal: To walk better PT Goal Formulation: With patient Time For Goal Achievement: 01/10/18 Potential to Achieve Goals: Good    Frequency BID   Barriers to discharge Inaccessible home environment      Co-evaluation               AM-PAC PT "6 Clicks" Daily Activity  Outcome Measure Difficulty turning over in bed (including adjusting bedclothes, sheets and blankets)?: Unable Difficulty moving from lying on back to  sitting on the side of the bed? : Unable Difficulty sitting down on and standing up from a chair with arms (e.g., wheelchair, bedside commode, etc,.)?: Unable Help needed moving to and from a bed to chair (including a wheelchair)?: A Lot Help needed walking in hospital room?: Total Help needed climbing 3-5 steps with a railing? : Total 6 Click Score: 7    End of Session Equipment Utilized During Treatment: Gait belt Activity Tolerance: Patient limited by pain Patient left: in bed;with nursing/sitter in room;Other (comment)(Nurse and CNA attending to pt hygiene ) Nurse Communication: Mobility status PT Visit Diagnosis: Unsteadiness on feet (R26.81);Muscle weakness (generalized) (M62.81);Other abnormalities of gait and mobility (R26.89)    Time: 0930-1002 PT Time Calculation (min) (ACUTE ONLY): 32 min   Charges:   PT Evaluation $PT Eval Low Complexity: 1 Low PT Treatments $Therapeutic Exercise: 8-22 mins        D. Scott Layah Skousen PT, DPT 12/28/17, 11:45 AM

## 2017-12-29 LAB — BPAM RBC
BLOOD PRODUCT EXPIRATION DATE: 201907262359
ISSUE DATE / TIME: 201907261358
Unit Type and Rh: 7300

## 2017-12-29 LAB — CBC
HCT: 25.4 % — ABNORMAL LOW (ref 35.0–47.0)
Hemoglobin: 8.8 g/dL — ABNORMAL LOW (ref 12.0–16.0)
MCH: 34 pg (ref 26.0–34.0)
MCHC: 34.7 g/dL (ref 32.0–36.0)
MCV: 98.2 fL (ref 80.0–100.0)
PLATELETS: 110 10*3/uL — AB (ref 150–440)
RBC: 2.59 MIL/uL — AB (ref 3.80–5.20)
RDW: 15 % — ABNORMAL HIGH (ref 11.5–14.5)
WBC: 8.6 10*3/uL (ref 3.6–11.0)

## 2017-12-29 LAB — TYPE AND SCREEN
ABO/RH(D): B POS
Antibody Screen: NEGATIVE
Unit division: 0

## 2017-12-29 NOTE — Progress Notes (Addendum)
[facility] - [place] at [facility]   PATIENT NAME: Tami [name]    MR#:  [id number]  DATE OF BIRTH:  [birth year]  SUBJECTIVE: Patient admitted for fall, found to have left hip fracture, left radial fracture.  Scheduled for surgery today.  Patient is hard of hearing and also has some confusion and baseline dementia.  CHIEF COMPLAINT:   Chief Complaint  Patient presents with  . Fall  Alert but not oriented to place and time given her baseline dementia.  Patient had a 1 unit of blood transfusion, no BM yet  REVIEW OF SYSTEMS:   Review of Systems  Unable to perform ROS: Dementia    DRUG ALLERGIES:   Allergies  Allergen Reactions  . Maxzide  [Hydrochlorothiazide W-Triamterene]   . Sulfa Antibiotics     VITALS:  Blood pressure (!) 106/45, pulse 62, temperature 98.8 F (37.1 C), temperature source Oral, resp. rate 18, height 5\' 5"  (1.651 m), weight 52.6 kg (116 lb), SpO2 98 %.  PHYSICAL EXAMINATION:  GENERAL:  84 y.o.-year-old patient lying in the bed with no acute distress.  pleasantly confused.  EYES: Pupils equal, round, reactive to light and accommodation. No scleral icterus.  HEENT: Head atraumatic, normocephalic. Oropharynx and nasopharynx clear.  NECK:  Supple, no jugular venous distention. No thyroid enlargement, no tenderness.  LUNGS: Normal breath sounds bilaterally, no wheezing, rales,rhonchi or crepitation. No use of accessory muscles of respiration.  CARDIOVASCULAR: S1, S2 normal. No murmurs, rubs, or gallops.  ABDOMEN: Soft, nontender, nondistended. Bowel sounds present.   EXTREMITIES: Left wrist and left hip in clean dressing NEUROLOGIC:Patient is awake but not oriented to time, place or person because of dementia  sKIN: No obvious rash, lesion, or ulcer.    LABORATORY PANEL:   CBC Recent Labs  Lab 12/29/17 0422  WBC 8.6  HGB 8.8*  HCT 25.4*  PLT 110*    ------------------------------------------------------------------------------------------------------------------  Chemistries  Recent Labs  Lab 12/28/17 0726  NA 135  K 3.8  CL 105  CO2 24  GLUCOSE 109*  BUN 21  CREATININE 0.91  CALCIUM 8.1*   ------------------------------------------------------------------------------------------------------------------  Cardiac Enzymes No results for input(s): TROPONINI in the last 168 hours. ------------------------------------------------------------------------------------------------------------------  RADIOLOGY:  Dg Wrist 2 Views Left  Result Date: 12/27/2017 CLINICAL DATA:  Postop wrist surgery EXAM: LEFT WRIST - 2 VIEW COMPARISON:  12/26/2017 FINDINGS: Plate and screw fixation across the distal left radial fracture. Near anatomic alignment. No hardware bony complicating feature. IMPRESSION: Internal fixation across the left distal radial fracture. No complicating feature. Electronically Signed   By: Charlett Nose M.D.   On: 12/27/2017 19:29    EKG:   Orders placed or performed during the hospital encounter of 12/26/17  . ED EKG  . ED EKG  . EKG 12-Lead  . EKG 12-Lead    ASSESSMENT AND PLAN:   84 year old female patient with attention, hyperlipidemia, dementia brought in because of mechanical fall at home and found to have left hip fracture, left radial fracture.  His chronic atrial fibrillation and she is taking Xarelto for that last dose was day before yesterday.     #1 left hip fracture, left distal radial fracture due to mechanical fall at home: Seen by Dr. Rosita Kea,  patient had surgery of left hip and left hand 12/27/2017 .  Continue with pain medicines as needed   follow-up with Ortho Weight-Bearing as tolerated to left leg.  No weightbearing to left wrist  #Acute blood loss anemia from surgery  hemoglobin trended  down from 10.0.0-7.2 Status post 1 unit of blood transfusion hemoglobin 9.1 following transfusion, -  8.8  #Tick bite-doxycycline 100 mg p.o. twice daily for 10 days  #  Chronic atrial fibrillation: Rate controlled, resume Xarelto, continue verapamil 240 mg p.o. Daily.  #.Hypertension: Controlled, continue verapamil, benazepril. . Disposition skilled nursing facility  Will discuss with patient daughter again about CODE STATUS.  All the records are reviewed and case discussed with Care Management/Social Workerr. Management plans discussed with the patient, family and they are in agreement.  CODE STATUS: full code TOTAL TIME TAKING CARE OF THIS PATIENT: 35 minutes.   POSSIBLE D/C IN 1-2 DAYS, DEPENDING ON CLINICAL CONDITION.   Ramonita LabAruna Lesle Faron M.D on 12/29/2017 at 4:05 PM  Between 7am to 6pm - Pager - [phone]  After 6pm go to [email or web address] - password [email or web address]  Fabio Neighborsagle [place] [facility]  Office  [phone]  CC: Primary care physician; Malva LimesFisher, Donald E, MD   Note: This dictation was prepared with Dragon dictation along with smaller phrase technology. Any transcriptional errors that result from this process are unintentional.

## 2017-12-29 NOTE — Progress Notes (Signed)
   Subjective: 2 Days Post-Op Procedure(s) (LRB): OPEN REDUCTION INTERNAL FIXATION (ORIF) WRIST FRACTURE (Left) INTRAMEDULLARY (IM) NAIL INTERTROCHANTRIC- AFFIXUS (Left) Patient reports pain as mild.  States that the hip is just sore. Patient is well, and has had no acute complaints or problems Therapy started yesterday.  Was able to ambulate just a couple steps.  Using platform walker Plan is to go Rehab after hospital stay. no nausea and no vomiting Patient denies any chest pains or shortness of breath. Objective: Vital signs in last 24 hours: Temp:  [97.6 F (36.4 C)-98.6 F (37 C)] 98 F (36.7 C) (07/26 2336) Pulse Rate:  [79-95] 79 (07/26 2336) Resp:  [18] 18 (07/26 2336) BP: (110-141)/(51-81) 111/57 (07/26 2336) SpO2:  [97 %-100 %] 99 % (07/26 2336) well approximated incision Heels are non tender and elevated off the bed using rolled towels Intake/Output from previous day: 07/26 0701 - 07/27 0700 In: 3037.5 [P.O.:580; I.V.:2070; Blood:387.5] Out: 1050 [Urine:1050] Intake/Output this shift: No intake/output data recorded.  Recent Labs    12/26/17 1553 12/28/17 0726 12/28/17 1810 12/29/17 0422  HGB 10.0* 7.2* 9.1* 8.8*   Recent Labs    12/28/17 0726 12/28/17 1810 12/29/17 0422  WBC 7.7  --  8.6  RBC 2.04*  --  2.59*  HCT 20.9* 26.6* 25.4*  PLT 101*  --  110*   Recent Labs    12/26/17 1553 12/28/17 0726  NA 136 135  K 3.9 3.8  CL 103 105  CO2 24 24  BUN 27* 21  CREATININE 1.07* 0.91  GLUCOSE 106* 109*  CALCIUM 9.0 8.1*   Recent Labs    12/26/17 1553  INR 1.48    EXAM General - Patient is Alert, Disorganized and Confused Extremity - Neurologically intact Neurovascular intact Sensation intact distally Intact pulses distally Dorsiflexion/Plantar flexion intact No cellulitis present Compartment soft Dressing - scant drainage to the volar wrist.  Otherwise the hip dressing is completely dry.  No signs of any infection Motor Function - intact,  moving foot and toes well on exam.    Past Medical History:  Diagnosis Date  . Allergy   . Chicken pox   . Chronic kidney disease   . Hyperlipidemia   . Hypertension   . Measles   . Mumps   . Osteoarthritis   . Osteoporosis   . Periorbital cellulitis of left eye 01/04/2015  . Vitamin D deficiency     Assessment/Plan: 2 Days Post-Op Procedure(s) (LRB): OPEN REDUCTION INTERNAL FIXATION (ORIF) WRIST FRACTURE (Left) INTRAMEDULLARY (IM) NAIL INTERTROCHANTRIC- AFFIXUS (Left) Active Problems:   Hip fracture (HCC)  Estimated body mass index is 19.3 kg/m as calculated from the following:   Height as of this encounter: 5\' 5"  (1.651 m).   Weight as of this encounter: 52.6 kg (116 lb). Up with therapy Discharge to SNF when medically cleared  Labs: Hemoglobin up from 7.2-8.8 DVT Prophylaxis - Xarelto, TED hose and SCD leg wraps Weight-Bearing as tolerated to left leg.  No weightbearing to left wrist Dressing change to left wrist Continues Xarelto [facility] with Dr. Tommi EmeryMenz   Quinnlyn Hearns [facility] PA [facility] 12/29/2017, 7:09 AM

## 2017-12-29 NOTE — Progress Notes (Signed)
Physical Therapy Treatment Patient Details Name: Tami [name] MRN: [id number] DOB: September 27, 1923 Today's Date: 12/29/2017    History of Present Illness Pt is a 84 y.o. female with a known history of to fibrillation on Xarelto, hyperlipidemia, hypertension who came to the emergency room from home after patient was standing near the window looking outside with daughter next to her and had a mechanical fall with etiology unclear.  Patient started having hip pain. In the ER she was found to have left hip fracture and a left distal radius fracture.  Pt is now s/p IM nailing to the L hip and ORIF to the L distal radius.      PT Comments    Pt agreeable to PT; reports minimal pain, but with attempted movement, pain elevates and pt noted to guard LLE. Pt does participate/follow instructions well with exercises and movement improves some with assist on L. Attempted sit, but pt moans/guards with increased LLE pain and wishes to stay in the bed. Plan to see pt again today; will re attempt at that time to improve functional mobility.    Follow Up Recommendations  SNF;Supervision for mobility/OOB     Equipment Recommendations       Recommendations for Other Services       Precautions / Restrictions Precautions Precautions: Fall Restrictions Weight Bearing Restrictions: Yes LUE Weight Bearing: Non weight bearing LLE Weight Bearing: Weight bearing as tolerated    Mobility  Bed Mobility                  Transfers                 General transfer comment: Attempted sit; pt in too much pain/guarding. Wshes to stay in bed  Ambulation/Gait                 Stairs             Wheelchair Mobility    Modified Rankin (Stroke Patients Only)       Balance                                            Cognition Arousal/Alertness: Awake/alert Behavior During Therapy: WFL for tasks assessed/performed Overall Cognitive Status: Within Functional  Limits for tasks assessed                                        Exercises Total Joint Exercises Ankle Circles/Pumps: AROM;Both;20 reps;Supine Quad Sets: Strengthening;Both;20 reps;Supine Gluteal Sets: Strengthening;Both;10 reps;Supine(weak) Short Arc [place] Pence[place]baraann Boys;Left;20 reps;Supine(AROM R) Heel Slides: AAROM;20 reps;Supine;Both Hip ABduction/ADduction: AAROM;20 reps;Supine;Both Straight Leg Raises: PROM;Left;20 reps;Supine(AAROM R)    General Comments        Pertinent Vitals/Pain Pain Assessment: Faces Faces Pain Scale: Hurts a little bit Pain Location: L hip/thigh Pain Descriptors / Indicators: Operative site guarding;Grimacing;Moaning Pain Intervention(s): Monitored during session;Premedicated before session;Limited activity within patient's tolerance    Home Living                      Prior Function            PT Goals (current goals can now be found in the care plan section) Progress towards PT goals: Progressing toward goals(slowly)    Frequency    BID  PT Plan Current plan remains appropriate    Co-evaluation              AM-PAC PT "6 Clicks" Daily Activity  Outcome Measure  Difficulty turning over in bed (including adjusting bedclothes, sheets and blankets)?: Unable Difficulty moving from lying on back to sitting on the side of the bed? : Unable Difficulty sitting down on and standing up from a chair with arms (e.g., wheelchair, bedside commode, etc,.)?: Unable Help needed moving to and from a bed to chair (including a wheelchair)?: Total Help needed walking in hospital room?: Total Help needed climbing 3-5 steps with a railing? : Total 6 Click Score: 6    End of Session   Activity Tolerance: Patient limited by pain(wishes to "stay just like this" in bed) Patient left: in bed;with call bell/phone within reach;with bed alarm set;with SCD's reapplied   PT Visit Diagnosis: Unsteadiness on feet (R26.81);Muscle  weakness (generalized) (M62.81);Other abnormalities of gait and mobility (R26.89)     Time: 7829-56210913-0940 PT Time Calculation (min) (ACUTE ONLY): 27 min  Charges:  $Therapeutic Exercise: 23-37 mins                      Scot DockHeidi E Barnes, PTA 12/29/2017, 10:16 AM

## 2017-12-29 NOTE — Plan of Care (Signed)

## 2017-12-29 NOTE — Progress Notes (Signed)
Physical Therapy Treatment Patient Details Name: Tami [name] MRN: [id number] DOB: 09-07-1923 Today's Date: 12/29/2017    History of Present Illness Pt is a 84 y.o. female with a known history of to fibrillation on Xarelto, hyperlipidemia, hypertension who came to the emergency room from home after patient was standing near the window looking outside with daughter next to her and had a mechanical fall with etiology unclear.  Patient started having hip pain. In the ER she was found to have left hip fracture and a left distal radius fracture.  Pt is now s/p IM nailing to the L hip and ORIF to the L distal radius.      PT Comments    Pt lethargic and does not feel up to doing much at this time. Reviewed exercise briefly with pt and present family. Encouraged pt to perform basic isometrics and ankle pumps, as they require no set up/assist. Pt states she has been trying to perform them. Continue PT to progress strength, endurance and comfortable range to improve functional mobility.    Follow Up Recommendations  SNF;Supervision for mobility/OOB     Equipment Recommendations       Recommendations for Other Services       Precautions / Restrictions Precautions Precautions: Fall Restrictions Weight Bearing Restrictions: Yes LUE Weight Bearing: Non weight bearing LLE Weight Bearing: Weight bearing as tolerated    Mobility  Bed Mobility                  Transfers                 General transfer comment: Attempted sit; pt in too much pain/guarding. Wshes to stay in bed  Ambulation/Gait                 Stairs             Wheelchair Mobility    Modified Rankin (Stroke Patients Only)       Balance                                            Cognition Arousal/Alertness: Lethargic Behavior During Therapy: WFL for tasks assessed/performed Overall Cognitive Status: Within Functional Limits for tasks assessed                                        Exercises Total Joint Exercises Ankle Circles/Pumps: AROM;Both;20 reps;Supine Quad Sets: Strengthening;Both;20 reps;Supine Gluteal Sets: Strengthening;Both;10 reps;Supine(weak) Short Arc [place]ann Boys;Left;20 reps;Supine(AROM R) Heel Slides: AAROM;20 reps;Supine;Both Hip ABduction/ADduction: AAROM;20 reps;Supine;Both Straight Leg Raises: PROM;Left;20 reps;Supine(AAROM R) Other Exercises Other Exercises: Pt lethargic and does not wish up and does not feel up to much. Family in room; reviewed basic exercises with pt and family for performance as able throughout the day x 9    General Comments        Pertinent Vitals/Pain Pain Assessment: Faces Faces Pain Scale: Hurts a little bit Pain Location: L hip/thigh Pain Descriptors / Indicators: Operative site guarding;Grimacing;Moaning Pain Intervention(s): Monitored during session;Premedicated before session;Limited activity within patient's tolerance    Home Living                      Prior Function            PT Goals (  current goals can now be found in the care plan section) Progress towards PT goals: Not progressing toward goals - comment    Frequency    BID      PT Plan Current plan remains appropriate    Co-evaluation              AM-PAC PT "6 Clicks" Daily Activity  Outcome Measure  Difficulty turning over in bed (including adjusting bedclothes, sheets and blankets)?: Unable Difficulty moving from lying on back to sitting on the side of the bed? : Unable Difficulty sitting down on and standing up from a chair with arms (e.g., wheelchair, bedside commode, etc,.)?: Unable Help needed moving to and from a bed to chair (including a wheelchair)?: Total Help needed walking in hospital room?: Total Help needed climbing 3-5 steps with a railing? : Total 6 Click Score: 6    End of Session   Activity Tolerance: Patient limited by lethargy Patient left: in bed;with call  bell/phone within reach;with bed alarm set;with family/visitor present;with SCD's reapplied   PT Visit Diagnosis: Unsteadiness on feet (R26.81);Muscle weakness (generalized) (M62.81);Other abnormalities of gait and mobility (R26.89)     Time: [id number] PT Time Calculation (min) (ACUTE ONLY): 9 min  Charges:  $Therapeutic Exercise: 8-22 mins                      Scot DockHeidi E Barnes, PTA 12/29/2017, 12:31 PM

## 2017-12-30 MED ORDER — LACTULOSE 10 GM/15ML PO SOLN
10.0000 g | Freq: Two times a day (BID) | ORAL | Status: DC | PRN
  Administered 2017-12-30: 10 g via ORAL
  Filled 2017-12-30: qty 30

## 2017-12-30 NOTE — Progress Notes (Signed)
Per daughter, Patient had a tick on her in the ED and found another after surgery. No ticks present on patient currently. Dr dr Amado CoeGouru, she spoke with ID and placed patient on doxycyline for this reason.

## 2017-12-30 NOTE — Progress Notes (Signed)
Physical Therapy Treatment Patient Details Name: Tami MottoLeona D [name] MRN: [id number] DOB: 1923-07-12 Today's Date: 12/30/2017    History of Present Illness Pt is a 84 y.o. female with a known history of to fibrillation on Xarelto, hyperlipidemia, hypertension who came to the emergency room from home after patient was standing near the window looking outside with daughter next to her and had a mechanical fall with etiology unclear.  Patient started having hip pain. In the ER she was found to have left hip fracture and a left distal radius fracture.  Pt is now s/p IM nailing to the L hip and ORIF to the L distal radius.      PT Comments    Pt was seen for follow up to eval with good performance today to sit up bedside and progress exercises.  Has tendency to try to wb through L wrist and so frequently reminded her to avoid this.  Progression of tx to standing should be possible soon given her ability to control sitting now. Follow acutely to strengthen her and reduce the time she will need to be in SNF.   Follow Up Recommendations  SNF     Equipment Recommendations  None recommended by PT(determine at SNF)    Recommendations for Other Services       Precautions / Restrictions Precautions Precautions: Fall Restrictions Weight Bearing Restrictions: Yes LUE Weight Bearing: Non weight bearing LLE Weight Bearing: Weight bearing as tolerated Other Position/Activity Restrictions: OK to use LUE platform walker, NWB through the L wrist    Mobility  Bed Mobility Overal bed mobility: Needs Assistance Bed Mobility: Supine to Sit;Sit to Supine     Supine to sit: Min assist;Mod assist Sit to supine: Mod assist;Max assist   General bed mobility comments: assisting with bed pad to sit up and then with pivot of legs to bed  Transfers Overall transfer level: Needs assistance Equipment used: 1 person hand held assist Transfers: Sit to/from Stand;Lateral/Scoot Transfers Sit to Stand: Total  assist        Lateral/Scoot Transfers: Min assist;Mod assist General transfer comment: pt could sit side of bed with fair to fair+ balance and move LE's to exercise  Ambulation/Gait             General Gait Details: unable to stand    Stairs             Wheelchair Mobility    Modified Rankin (Stroke Patients Only)       Balance Overall balance assessment: Needs assistance;History of Falls Sitting-balance support: Feet supported;Single extremity supported(reminders frequently not to wb through L wrist) Sitting balance-Leahy Scale: Fair                                      Cognition Arousal/Alertness: Awake/alert Behavior During Therapy: WFL for tasks assessed/performed Overall Cognitive Status: Within Functional Limits for tasks assessed                                        Exercises Total Joint Exercises Heel Slides: AROM;AAROM;Both;10 reps Hip ABduction/ADduction: AROM;AAROM;Both;10 reps Long Arc Quad: AROM;Strengthening;Both;10 reps General Exercises - Lower Extremity Hip Flexion/Marching: AROM;Both;10 reps;AAROM    General Comments        Pertinent Vitals/Pain Pain Assessment: Faces Faces Pain Scale: Hurts little more Pain Location: L hip/thigh Pain  Descriptors / Indicators: Operative site guarding;Grimacing;Moaning Pain Intervention(s): Limited activity within patient's tolerance;Premedicated before session;Monitored during session;Repositioned    Home Living                      Prior Function            PT Goals (current goals can now be found in the care plan section) Progress towards PT goals: Progressing toward goals    Frequency    BID      PT Plan Current plan remains appropriate    Co-evaluation              AM-PAC PT "6 Clicks" Daily Activity  Outcome Measure  Difficulty turning over in bed (including adjusting bedclothes, sheets and blankets)?: Unable Difficulty  moving from lying on back to sitting on the side of the bed? : Unable Difficulty sitting down on and standing up from a chair with arms (e.g., wheelchair, bedside commode, etc,.)?: Unable Help needed moving to and from a bed to chair (including a wheelchair)?: A Lot Help needed walking in hospital room?: Total Help needed climbing 3-5 steps with a railing? : Total 6 Click Score: 7    End of Session   Activity Tolerance: Patient tolerated treatment well;Patient limited by fatigue Patient left: in bed;with call bell/phone within reach;with bed alarm set Nurse Communication: Mobility status PT Visit Diagnosis: Unsteadiness on feet (R26.81);Muscle weakness (generalized) (M62.81);Other abnormalities of gait and mobility (R26.89)     Time: 4098-1191 PT Time Calculation (min) (ACUTE ONLY): 30 min  Charges:  $Therapeutic Exercise: 8-22 mins $Therapeutic Activity: 8-22 mins                    Ivar Drape 12/30/2017, 1:07 PM   Samul Dada, PT MS Acute Rehab Dept. Number: [facility] [id number] and [facility] [phone]

## 2017-12-30 NOTE — Plan of Care (Signed)

## 2017-12-30 NOTE — Progress Notes (Signed)
   Subjective: 3 Days Post-Op Procedure(s) (LRB): OPEN REDUCTION INTERNAL FIXATION (ORIF) WRIST FRACTURE (Left) INTRAMEDULLARY (IM) NAIL INTERTROCHANTRIC- AFFIXUS (Left) Patient reports pain as mild.   Patient is well, and has had no acute complaints or problems Physical therapy is extremely slow. Per Dr. Rob HickmanGouru's plan yesterday, patient being treated for tick bite with doxycycline.  Nothing in the history about this.  Nursing does not know anything about this as well. Plan is to go Rehab after hospital stay. no nausea and no vomiting Patient denies any chest pains or shortness of breath. Objective: Vital signs in last 24 hours: Temp:  [97.9 F (36.6 C)-98.8 F (37.1 C)] 97.9 F (36.6 C) (07/28 0727) Pulse Rate:  [62-79] 64 (07/28 0727) BP: (105-107)/(45-52) 105/47 (07/28 0727) SpO2:  [95 %-98 %] 98 % (07/28 0727) well approximated incision Heels are non tender and elevated off the bed using rolled towels Intake/Output from previous day: 07/27 0701 - 07/28 0700 In: 240 [P.O.:240] Out: 1600 [Urine:1600] Intake/Output this shift: No intake/output data recorded.  Recent Labs    12/28/17 0726 12/28/17 1810 12/29/17 0422  HGB 7.2* 9.1* 8.8*   Recent Labs    12/28/17 0726 12/28/17 1810 12/29/17 0422  WBC 7.7  --  8.6  RBC 2.04*  --  2.59*  HCT 20.9* 26.6* 25.4*  PLT 101*  --  110*   Recent Labs    12/28/17 0726  NA 135  K 3.8  CL 105  CO2 24  BUN 21  CREATININE 0.91  GLUCOSE 109*  CALCIUM 8.1*   No results for input(s): LABPT, INR in the last 72 hours.  EXAM General - Patient is Alert, Appropriate and Oriented Extremity - Neurologically intact Neurovascular intact Sensation intact distally Intact pulses distally Dorsiflexion/Plantar flexion intact No cellulitis present Compartment soft Dressing - dressing C/D/I Motor Function - intact, moving foot and toes well on exam.    Past Medical History:  Diagnosis Date  . Allergy   . Chicken pox   .  Chronic kidney disease   . Hyperlipidemia   . Hypertension   . Measles   . Mumps   . Osteoarthritis   . Osteoporosis   . Periorbital cellulitis of left eye 01/04/2015  . Vitamin D deficiency     Assessment/Plan: 3 Days Post-Op Procedure(s) (LRB): OPEN REDUCTION INTERNAL FIXATION (ORIF) WRIST FRACTURE (Left) INTRAMEDULLARY (IM) NAIL INTERTROCHANTRIC- AFFIXUS (Left) Active Problems:   Hip fracture (HCC)  Estimated body mass index is 19.3 kg/m as calculated from the following:   Height as of this encounter: 5\' 5"  (1.651 m).   Weight as of this encounter: 52.6 kg (116 lb). Up with therapy Discharge to SNF when medically cleared  Labs: None DVT Prophylaxis - Xarelto, Foot Pumps and TED hose Weight-Bearing as tolerated to left leg Patient needs a bowel movement today.  Placed on lactulose Will need to follow-up with Dr. Rosita KeaMenz in 2 weeks Continue TED stockings bilaterally Elevate heels off the bed using rolled towels  Una [facility]omans [facility] 12/30/2017, 8:00 AM

## 2017-12-30 NOTE — Progress Notes (Signed)
[facility] [place] at [facility]   PATIENT NAME: Tami [name]    MR#:  [id number]  DATE OF BIRTH:  [birth year]  SUBJECTIVE: Patient admitted for fall, found to have left hip fracture, left radial fracture.  Scheduled for surgery today.  Patient is hard of hearing and also has some confusion and baseline dementia.  CHIEF COMPLAINT:   Chief Complaint  Patient presents with  . Fall  Alert but not oriented to place and time given her baseline dementia.  Patient had a 1 unit of blood transfusion, working okay with physical therapy  REVIEW OF SYSTEMS:   Review of Systems  Unable to perform ROS: Dementia    DRUG ALLERGIES:   Allergies  Allergen Reactions  . Maxzide  [Hydrochlorothiazide W-Triamterene]   . Sulfa Antibiotics     VITALS:  Blood pressure (!) 105/47, pulse 64, temperature 97.9 F (36.6 C), temperature source Oral, resp. rate 18, height 5\' 5"  (1.651 m), weight 52.6 kg (116 lb), SpO2 98 %.  PHYSICAL EXAMINATION:  GENERAL:  84 y.o.-year-old patient lying in the bed with no acute distress.  pleasantly confused.  EYES: Pupils equal, round, reactive to light and accommodation. No scleral icterus.  HEENT: Head atraumatic, normocephalic. Oropharynx and nasopharynx clear.  NECK:  Supple, no jugular venous distention. No thyroid enlargement, no tenderness.  LUNGS: Normal breath sounds bilaterally, no wheezing, rales,rhonchi or crepitation. No use of accessory muscles of respiration.  CARDIOVASCULAR: S1, S2 normal. No murmurs, rubs, or gallops.  ABDOMEN: Soft, nontender, nondistended. Bowel sounds present.   EXTREMITIES: Left wrist and left hip in clean dressing NEUROLOGIC:Patient is awake but not oriented to time, place or person because of dementia  sKIN: No obvious rash, lesion, or ulcer.    LABORATORY PANEL:   CBC Recent Labs  Lab 12/29/17 0422  WBC 8.6  HGB 8.8*  HCT 25.4*  PLT 110*    ------------------------------------------------------------------------------------------------------------------  Chemistries  Recent Labs  Lab 12/28/17 0726  NA 135  K 3.8  CL 105  CO2 24  GLUCOSE 109*  BUN 21  CREATININE 0.91  CALCIUM 8.1*   ------------------------------------------------------------------------------------------------------------------  Cardiac Enzymes No results for input(s): TROPONINI in the last 168 hours. ------------------------------------------------------------------------------------------------------------------  RADIOLOGY:  No results found.  EKG:   Orders placed or performed during the hospital encounter of 12/26/17  . ED EKG  . ED EKG  . EKG 12-Lead  . EKG 12-Lead    ASSESSMENT AND PLAN:   84 year old female patient with attention, hyperlipidemia, dementia brought in because of mechanical fall at home and found to have left hip fracture, left radial fracture.  His chronic atrial fibrillation and she is taking Xarelto for that last dose was day before yesterday.     #1 left hip fracture, left distal radial fracture due to mechanical fall at home: Seen by Dr. Rosita Kea,  patient had surgery of left hip and left hand 12/27/2017 Pain is well tolerated and working with physical therapy, cooperative  follow-up with Ortho Weight-Bearing as tolerated to left leg.  No weightbearing to left wrist  #Acute blood loss anemia from surgery  hemoglobin trended down from 10.0.0-7.2 Status post 1 unit of blood transfusion hemoglobin 9.1 following transfusion, - 8.8  #Tick bite-doxycycline 100 mg p.o. twice daily for 10 days  #  Chronic atrial fibrillation: Rate controlled, resume Xarelto, continue verapamil 240 mg p.o. Daily.  #.Hypertension: Controlled, continue verapamil, benazepril. . Disposition skilled nursing facility  Will discuss with patient daughter again about CODE STATUS.  All the records are reviewed and case discussed with Care  Management/Social Workerr. Management plans discussed with the patient, family and they are in agreement.  CODE STATUS: full code TOTAL TIME TAKING CARE OF THIS PATIENT: 28  minutes.   POSSIBLE D/C IN 1 DAYS, DEPENDING ON CLINICAL CONDITION.   Ramonita LabAruna Ily Denno M.D on 12/30/2017 at 2:46 PM  Between 7am to 6pm - Pager - [phone]  After 6pm go to [email or web address] - password [email or web address]  Fabio Neighborsagle [place] [facility]  Office  [phone]  CC: Primary care physician; Malva LimesFisher, Donald E, MD   Note: This dictation was prepared with Dragon dictation along with smaller phrase technology. Any transcriptional errors that result from this process are unintentional.

## 2017-12-31 DIAGNOSIS — I482 Chronic atrial fibrillation: Secondary | ICD-10-CM | POA: Diagnosis not present

## 2017-12-31 DIAGNOSIS — S30860A Insect bite (nonvenomous) of lower back and pelvis, initial encounter: Secondary | ICD-10-CM | POA: Diagnosis not present

## 2017-12-31 DIAGNOSIS — Z7401 Bed confinement status: Secondary | ICD-10-CM | POA: Diagnosis not present

## 2017-12-31 DIAGNOSIS — M810 Age-related osteoporosis without current pathological fracture: Secondary | ICD-10-CM | POA: Diagnosis not present

## 2017-12-31 DIAGNOSIS — S72009A Fracture of unspecified part of neck of unspecified femur, initial encounter for closed fracture: Secondary | ICD-10-CM | POA: Diagnosis not present

## 2017-12-31 DIAGNOSIS — M62838 Other muscle spasm: Secondary | ICD-10-CM | POA: Diagnosis not present

## 2017-12-31 DIAGNOSIS — S52502A Unspecified fracture of the lower end of left radius, initial encounter for closed fracture: Secondary | ICD-10-CM | POA: Diagnosis not present

## 2017-12-31 DIAGNOSIS — I4891 Unspecified atrial fibrillation: Secondary | ICD-10-CM | POA: Diagnosis not present

## 2017-12-31 DIAGNOSIS — M25552 Pain in left hip: Secondary | ICD-10-CM | POA: Diagnosis not present

## 2017-12-31 DIAGNOSIS — S72002D Fracture of unspecified part of neck of left femur, subsequent encounter for closed fracture with routine healing: Secondary | ICD-10-CM | POA: Diagnosis not present

## 2017-12-31 DIAGNOSIS — N181 Chronic kidney disease, stage 1: Secondary | ICD-10-CM | POA: Diagnosis not present

## 2017-12-31 DIAGNOSIS — W57XXXA Bitten or stung by nonvenomous insect and other nonvenomous arthropods, initial encounter: Secondary | ICD-10-CM | POA: Diagnosis not present

## 2017-12-31 DIAGNOSIS — S62102D Fracture of unspecified carpal bone, left wrist, subsequent encounter for fracture with routine healing: Secondary | ICD-10-CM | POA: Diagnosis not present

## 2017-12-31 MED ORDER — ONDANSETRON HCL 4 MG PO TABS: 4.0000 mg | ORAL_TABLET | Freq: Four times a day (QID) | ORAL | 0 refills | Status: DC | PRN

## 2017-12-31 MED ORDER — ALUM & MAG HYDROXIDE-SIMETH 200-200-20 MG/5ML PO SUSP: 30.0000 mL | ORAL | 0 refills | Status: DC | PRN

## 2017-12-31 MED ORDER — HYDROCODONE-ACETAMINOPHEN 5-325 MG PO TABS: 1.0000 | ORAL_TABLET | Freq: Four times a day (QID) | ORAL | 0 refills | Status: DC | PRN

## 2017-12-31 MED ORDER — DOCUSATE SODIUM 100 MG PO CAPS: 100.0000 mg | ORAL_CAPSULE | Freq: Two times a day (BID) | ORAL | 0 refills | Status: DC

## 2017-12-31 MED ORDER — METHOCARBAMOL 500 MG PO TABS: 500.0000 mg | ORAL_TABLET | Freq: Four times a day (QID) | ORAL | 0 refills | Status: DC | PRN

## 2017-12-31 MED ORDER — DOXYCYCLINE HYCLATE 100 MG PO TABS: 100.0000 mg | ORAL_TABLET | Freq: Two times a day (BID) | ORAL | 0 refills | Status: AC

## 2017-12-31 MED ORDER — POLYETHYLENE GLYCOL 3350 17 G PO PACK: 17.0000 g | PACK | Freq: Every day | ORAL | 0 refills | Status: DC | PRN

## 2017-12-31 MED ORDER — ACETAMINOPHEN 325 MG PO TABS: 325.0000 mg | ORAL_TABLET | Freq: Four times a day (QID) | ORAL | Status: AC | PRN

## 2017-12-31 NOTE — Progress Notes (Signed)
Physical Therapy Treatment Patient Details Name: Tami MottoLeona D Catala MRN: [id number] DOB: 1923-06-19 Today's Date: 12/31/2017    History of Present Illness Pt is a 84 y.o. female with a known history of to fibrillation on Xarelto, hyperlipidemia, hypertension who came to the emergency room from home after patient was standing near the window looking outside with daughter next to her and had a mechanical fall with etiology unclear.  Patient started having hip pain. In the ER she was found to have left hip fracture and a left distal radius fracture.  Pt is now s/p IM nailing to the L hip and ORIF to the L distal radius.      PT Comments    Pt is progressing towards goals, but still needs +2 physical assist with most tasks. Pt able to preform there.ex with Verbal and occasional Min A. Bed mobility required Mod A x2 secondary to pain, weakness, and inability to follow weight bearing restrictions for L UE. Pt needs heavy verbal and tactile cueing with sitting balance to prevent falling backwards and stay compliant with UE weight bearing restrictions. Pt was able to sit <>stand with total assist x2 at gait belt an UE to prevent falls and keep pt compliant with UE weight bearing restrictions. Pt functional mobility is limited by pain, FOF, and understanding of weight bearing restrictions. D/C recommendation continue to be appropriate at this time, and pt is likely to d/c today.   Follow Up Recommendations  SNF     Equipment Recommendations  None recommended by PT    Recommendations for Other Services       Precautions / Restrictions Precautions Precautions: Fall Restrictions Weight Bearing Restrictions: Yes LUE Weight Bearing: Non weight bearing LLE Weight Bearing: Weight bearing as tolerated Other Position/Activity Restrictions: OK to use LUE platform walker, NWB through the L wrist    Mobility  Bed Mobility Overal bed mobility: Needs Assistance Bed Mobility: Supine to Sit;Sit to Supine     Supine to sit: Mod assist;+2 for physical assistance Sit to supine: Mod assist;+2 for physical assistance   General bed mobility comments: MOD A x2 at LEs, trunk, and bed pad. Pt is very fearful of movment and requires heavy verbal and tactile cueing to avoid use of L UE per weight bearing restrictions.  Transfers Overall transfer level: Needs assistance Equipment used: 1 person hand held assist Transfers: Sit to/from Stand Sit to Stand: Max assist;+2 physical assistance         General transfer comment: Pt required Max A x2 for sit<>stand at gait belt and hand held assist. Hand held assist on L wrist is for tactile cueing to keep pt compliant with weightbearing restrictions. Pt needed heavy verbal and tactile cueing to sit EOB with Min to Mod A secondray to posterior lean, pain, and UE weight bearing restrictions.   Ambulation/Gait             General Gait Details: unable to stand    Stairs             Wheelchair Mobility    Modified Rankin (Stroke Patients Only)       Balance Overall balance assessment: Needs assistance;History of Falls Sitting-balance support: Feet supported;Single extremity supported                                        Cognition Arousal/Alertness: Awake/alert Behavior During Therapy: WFL for tasks assessed/performed Overall  Cognitive Status: Within Functional Limits for tasks assessed                                 General Comments: difficult to assess secondary to [facility]      Exercises Total Joint Exercises Quad Sets: Strengthening;Both;20 reps;Supine Heel Slides: AROM;AAROM;Both;10 reps Hip ABduction/ADduction: AROM;AAROM;Both;10 reps Straight Leg Raises: AROM;AAROM;10 reps;Supine;Both    General Comments        Pertinent Vitals/Pain Pain Assessment: Faces Faces Pain Scale: Hurts little more Pain Location: L hip/thigh Pain Descriptors / Indicators: Grimacing;Operative site guarding Pain  Intervention(s): Limited activity within patient's tolerance;Monitored during session;Repositioned    Home Living                      Prior Function            PT Goals (current goals can now be found in the care plan section) Acute Rehab PT Goals Patient Stated Goal: to not hurt with moving PT Goal Formulation: With patient Time For Goal Achievement: 01/10/18 Potential to Achieve Goals: Good Progress towards PT goals: Progressing toward goals    Frequency    BID      PT Plan Current plan remains appropriate    Co-evaluation              AM-PAC PT "6 Clicks" Daily Activity  Outcome Measure  Difficulty turning over in bed (including adjusting bedclothes, sheets and blankets)?: Unable Difficulty moving from lying on back to sitting on the side of the bed? : Unable Difficulty sitting down on and standing up from a chair with arms (e.g., wheelchair, bedside commode, etc,.)?: Unable Help needed moving to and from a bed to chair (including a wheelchair)?: A Lot Help needed walking in hospital room?: Total Help needed climbing 3-5 steps with a railing? : Total 6 Click Score: 7    End of Session Equipment Utilized During Treatment: Gait belt Activity Tolerance: Patient limited by fatigue;Patient limited by pain(pt limited by fear of falling) Patient left: in bed;with call bell/phone within reach;with bed alarm set;with SCD's reapplied Nurse Communication: Mobility status(and external catheter removal) PT Visit Diagnosis: Unsteadiness on feet (R26.81);Muscle weakness (generalized) (M62.81);Other abnormalities of gait and mobility (R26.89)     Time: 1010-1033 PT Time Calculation (min) (ACUTE ONLY): 23 min  Charges:                        Renaldo Harrison, SPT    Renaldo Harrison 12/31/2017, 11:08 AM

## 2017-12-31 NOTE — Telephone Encounter (Signed)
Patient's daughter stated they are moving pt today to outpatient rehabilitation. [name] stated she will talk to facility about getting a psych evaluation.

## 2017-12-31 NOTE — Telephone Encounter (Signed)
LMTCB

## 2017-12-31 NOTE — Discharge Summary (Signed)
[facility] - [place] at [facility]   PATIENT NAME: Tami [name]    MR#:  [id number]  DATE OF BIRTH:  [birth year]  DATE OF ADMISSION:  12/26/2017 ADMITTING PHYSICIAN: Enedina Finner, MD  DATE OF DISCHARGE: 12/31/17  PRIMARY CARE PHYSICIAN: Malva Limes, MD    ADMISSION DIAGNOSIS:  Closed fracture of left hip, initial encounter (HCC) [S72.002A] Closed fracture of left wrist, initial encounter [S62.102A]  DISCHARGE DIAGNOSIS:  Active Problems:   Hip fracture (HCC)   SECONDARY DIAGNOSIS:   Past Medical History:  Diagnosis Date  . Allergy   . Chicken pox   . Chronic kidney disease   . Hyperlipidemia   . Hypertension   . Measles   . Mumps   . Osteoarthritis   . Osteoporosis   . Periorbital cellulitis of left eye 01/04/2015  . Vitamin D deficiency     HOSPITAL COURSE:   84 year old female patient with attention, hyperlipidemia, dementia brought in because of mechanical fall at home and found to have left hip fracture, left radial fracture.  His chronic atrial fibrillation and she is taking Xarelto for that last dose was day before yesterday.     #1 left hip fracture, left distal radial fracture due to mechanical fall at home: Seen by Dr. Rosita Kea,  patient had surgery of left hip and left hand 12/27/2017 Pain is well tolerated and working with physical therapy, cooperative  follow-up with Ortho Weight-Bearing as tolerated toleftleg. No weightbearing to left wrist  #Acute blood loss anemia from surgery  hemoglobin trended down from 10.0.0-7.2 Status post 1 unit of blood transfusion hemoglobin 9.1 following transfusion, - 8.8  #Tick bite-doxycycline 100 mg p.o. twice daily for 10 days  #  Chronic atrial fibrillation: Rate controlled, resume Xarelto, continue verapamil 240 mg p.o. Daily.  #.Hypertension: Controlled, continue verapamil, benazepril. . Disposition skilled nursing facility    DISCHARGE CONDITIONS:    fair  CONSULTS  OBTAINED:  Treatment Team:  Kennedy Bucker, MD   PROCEDURES left hip and left distal radius fracture repair  DRUG ALLERGIES:   Allergies  Allergen Reactions  . Maxzide  [Hydrochlorothiazide W-Triamterene]   . Sulfa Antibiotics     DISCHARGE MEDICATIONS:   Allergies as of 12/31/2017      Reactions   Maxzide  [hydrochlorothiazide W-triamterene]    Sulfa Antibiotics       Medication List    STOP taking these medications   benazepril 10 MG tablet Commonly known as:  LOTENSIN     TAKE these medications   acetaminophen 325 MG tablet Commonly known as:  TYLENOL Take 1-2 tablets (325-650 mg total) by mouth every 6 (six) hours as needed for mild pain (pain score 1-3 or temp > 100.5).   alum & mag hydroxide-simeth 200-200-20 MG/5ML suspension Commonly known as:  MAALOX/MYLANTA Take 30 mLs by mouth every 4 (four) hours as needed for indigestion.   docusate sodium 100 MG capsule Commonly known as:  COLACE Take 1 capsule (100 mg total) by mouth 2 (two) times daily. What changed:  when to take this   doxycycline 100 MG tablet Commonly known as:  VIBRA-TABS Take 1 tablet (100 mg total) by mouth 2 (two) times daily with a meal for 7 days.   HYDROcodone-acetaminophen 5-325 MG tablet Commonly known as:  NORCO/VICODIN Take 1-2 tablets by mouth every 6 (six) hours as needed for moderate pain.   loratadine 10 MG tablet Commonly known as:  CLARITIN Take by mouth daily.   methocarbamol 500 MG  tablet Commonly known as:  ROBAXIN Take 1 tablet (500 mg total) by mouth every 6 (six) hours as needed for muscle spasms.   ondansetron 4 MG tablet Commonly known as:  ZOFRAN Take 1 tablet (4 mg total) by mouth every 6 (six) hours as needed for nausea.   polyethylene glycol packet Commonly known as:  MIRALAX / GLYCOLAX Take 17 g by mouth daily as needed for mild constipation.   Rivaroxaban 15 MG Tabs tablet Commonly known as:  XARELTO Take 1 tablet (15 mg total) by mouth daily with  supper.   verapamil 240 MG CR tablet Commonly known as:  CALAN-SR TAKE ONE TABLET BY MOUTH DAILY   Vitamin D3 2000 units Chew Chew 1 tablet by mouth daily.        DISCHARGE INSTRUCTIONS:   Follow-up with primary care physician at the facility in 2 to 3 days Continue physical therapy Will up with orthopedics Dr. Rosita KeaMenz in 10-14 -days for staple Removal  DIET:  Cardiac diet  DISCHARGE CONDITION:  Fair  ACTIVITY:  Activity as tolerated per PT    OXYGEN:  Home Oxygen: No.   Oxygen Delivery: room air  DISCHARGE LOCATION:  nursing home   If you experience worsening of your admission symptoms, develop shortness of breath, life threatening emergency, suicidal or homicidal thoughts you must seek medical attention immediately by calling 911 or calling your MD immediately  if symptoms less severe.  You Must read complete instructions/literature along with all the possible adverse reactions/side effects for all the Medicines you take and that have been prescribed to you. Take any new Medicines after you have completely understood and accpet all the possible adverse reactions/side effects.   Please note  You were cared for by a hospitalist during your hospital stay. If you have any questions about your discharge medications or the care you received while you were in the hospital after you are discharged, you can call the unit and asked to speak with the hospitalist on call if the hospitalist that took care of you is not available. Once you are discharged, your primary care physician will handle any further medical issues. Please note that NO REFILLS for any discharge medications will be authorized once you are discharged, as it is imperative that you return to your primary care physician (or establish a relationship with a primary care physician if you do not have one) for your aftercare needs so that they can reassess your need for medications and monitor your lab values.     Today   Chief Complaint  Patient presents with  . Fall   Patient is demented but answers few questions appropriately.  Feels fine.  No complaints.  No overnight events  ROS:  CONSTITUTIONAL: Denies fevers, chills. Denies any fatigue, weakness.  EYES: Denies blurry vision, double vision, eye pain. EARS, NOSE, THROAT: Denies tinnitus, ear pain, hearing loss. RESPIRATORY: Denies cough, wheeze, shortness of breath.  CARDIOVASCULAR: Denies chest pain, palpitations, edema.  GASTROINTESTINAL: Denies nausea, vomiting, diarrhea, abdominal pain. Denies bright red blood per rectum. SKIN: Denies rash or lesion. MUSCULOSKELETAL: Left hip and left hand pain are manageable  NEUROLOGIC: Denies paralysis, paresthesias.  PSYCHIATRIC: Denies anxiety or depressive symptoms.   VITAL SIGNS:  Blood pressure 139/72, pulse 83, temperature (!) 97.5 F (36.4 C), temperature source Oral, resp. rate 18, height 5\' 5"  (1.651 m), weight 52.6 kg (116 lb), SpO2 100 %.  I/O:    Intake/Output Summary (Last 24 hours) at 12/31/2017 1027 Last data filed at  12/31/2017 0945 Gross per 24 hour  Intake 840 ml  Output 1100 ml  Net -260 ml    PHYSICAL EXAMINATION:  GENERAL:  84 y.o.-year-old patient lying in the bed with no acute distress.  EYES: Pupils equal, round, reactive to light and accommodation. No scleral icterus. Extraocular muscles intact.  HEENT: Head atraumatic, normocephalic. Oropharynx and nasopharynx clear.  NECK:  Supple, no jugular venous distention. No thyroid enlargement, no tenderness.  LUNGS: Normal breath sounds bilaterally, no wheezing, rales,rhonchi or crepitation. No use of accessory muscles of respiration.  CARDIOVASCULAR: S1, S2 normal. No murmurs, rubs, or gallops.  ABDOMEN: Soft, non-tender, non-distended. Bowel sounds present. No organomegaly or mass.  EXTREMITIES:Left wrist and left hip in clean dressing   No pedal edema, cyanosis, or clubbing.  NEUROLOGIC: Awake, alert and oriented x2  pSYCHIATRIC: The patient is alert and oriented x 2  SKIN: No obvious rash, lesion, or ulcer.   DATA REVIEW:   CBC Recent Labs  Lab 12/29/17 0422  WBC 8.6  HGB 8.8*  HCT 25.4*  PLT 110*    Chemistries  Recent Labs  Lab 12/28/17 0726  NA 135  K 3.8  CL 105  CO2 24  GLUCOSE 109*  BUN 21  CREATININE 0.91  CALCIUM 8.1*    Cardiac Enzymes No results for input(s): TROPONINI in the last 168 hours.  Microbiology Results  Results for orders placed or performed during the hospital encounter of 12/26/17  Surgical pcr screen     Status: None   Collection Time: 12/27/17  4:36 AM  Result Value Ref Range Status   MRSA, PCR NEGATIVE NEGATIVE Final   Staphylococcus aureus NEGATIVE NEGATIVE Final    Comment: (NOTE) The Xpert SA Assay (FDA approved for NASAL specimens in patients 56 years of age and older), is one component of a comprehensive surveillance program. It is not intended to diagnose infection nor to guide or monitor treatment. Performed at [facility], [address] [place], [place] [address]     RADIOLOGY:  Dg Wrist 2 Views Left  Result Date: 12/27/2017 CLINICAL DATA:  Postop wrist surgery EXAM: LEFT WRIST - 2 VIEW COMPARISON:  12/26/2017 FINDINGS: Plate and screw fixation across the distal left radial fracture. Near anatomic alignment. No hardware bony complicating feature. IMPRESSION: Internal fixation across the left distal radial fracture. No complicating feature. Electronically Signed   By: Charlett Nose M.D.   On: 12/27/2017 19:29   Dg C-arm 1-60 Min  Result Date: 12/27/2017 CLINICAL DATA:  Intraoperative imaging for left proximal femur fracture reduction. EXAM: DG C-ARM 61-120 MIN; LEFT FEMUR 2 VIEWS COMPARISON:  12/26/2017. FINDINGS: Portable imaging shows placement of a compression screw and intramedullary rod reducing the intertrochanteric proximal femur fracture components into near anatomic alignment. The orthopedic hardware is well-seated. No  evidence acute fracture or operative complication. IMPRESSION: 1. Successful reduction of the intertrochanteric left proximal femur fracture following ORIF Electronically Signed   By: Amie Portland M.D.   On: 12/27/2017 14:12   Dg Femur Min 2 Views Left  Result Date: 12/27/2017 CLINICAL DATA:  Intraoperative imaging for left proximal femur fracture reduction. EXAM: DG C-ARM 61-120 MIN; LEFT FEMUR 2 VIEWS COMPARISON:  12/26/2017. FINDINGS: Portable imaging shows placement of a compression screw and intramedullary rod reducing the intertrochanteric proximal femur fracture components into near anatomic alignment. The orthopedic hardware is well-seated. No evidence acute fracture or operative complication. IMPRESSION: 1. Successful reduction of the intertrochanteric left proximal femur fracture following ORIF Electronically Signed   By:  Amie Portland M.D.   On: 12/27/2017 14:12    EKG:   Orders placed or performed during the hospital encounter of 12/26/17  . ED EKG  . ED EKG  . EKG 12-Lead  . EKG 12-Lead      Management plans discussed with the patient, family and they are in agreement.  CODE STATUS:     Code Status Orders  (From admission, onward)        Start     Ordered   12/26/17 2008  Full code  Continuous     12/26/17 2007    Code Status History    This patient has a current code status but no historical code status.      TOTAL TIME TAKING CARE OF THIS PATIENT: 43 minutes.   Note: This dictation was prepared with Dragon dictation along with smaller phrase technology. Any transcriptional errors that result from this process are unintentional.   @M[facility] @  on 12/31/2017 at 10:27 AM  Between 7am to 6pm - Pager - [phone]  After 6pm go to [email or web address] - password [email or web address] [facility]  [facility]  Office  [phone]  CC: Primary care physician; Malva Limes, MD

## 2017-12-31 NOTE — Progress Notes (Signed)
Subjective: 4 Days Post-Op Procedure(s) (LRB): OPEN REDUCTION INTERNAL FIXATION (ORIF) WRIST FRACTURE (Left) INTRAMEDULLARY (IM) NAIL INTERTROCHANTRIC- AFFIXUS (Left) Patient reports pain as mild.   Patient is well, and has had no acute complaints or problems Physical therapy is extremely slow. Per Dr. Rob HickmanGouru's plan yesterday, patient being treated for tick bite with doxycycline. Plan is to go Rehab after hospital stay. no nausea and no vomiting Patient denies any chest pains or shortness of breath.  Objective: Vital signs in last 24 hours: Temp:  [98.5 F (36.9 C)] 98.5 F (36.9 C) (07/28 1606) Pulse Rate:  [84-90] 90 (07/28 2347) BP: (118-124)/(54-57) 124/57 (07/28 2347) SpO2:  [99 %] 99 % (07/28 2347) well approximated incision Heels are non tender and elevated off the bed using rolled towels Intake/Output from previous day: 07/28 0701 - 07/29 0700 In: 480 [P.O.:480] Out: 1100 [Urine:1100] Intake/Output this shift: No intake/output data recorded.  Recent Labs    12/28/17 1810 12/29/17 0422  HGB 9.1* 8.8*   Recent Labs    12/28/17 1810 12/29/17 0422  WBC  --  8.6  RBC  --  2.59*  HCT 26.6* 25.4*  PLT  --  110*   No results for input(s): NA, K, CL, CO2, BUN, CREATININE, GLUCOSE, CALCIUM in the last 72 hours. No results for input(s): LABPT, INR in the last 72 hours.  EXAM General - Patient is Alert, Appropriate and Oriented Extremity - Neurologically intact Neurovascular intact Sensation intact distally Intact pulses distally Dorsiflexion/Plantar flexion intact No cellulitis present Compartment soft Dressing - dressing C/D/I Motor Function - intact, moving foot and toes well on exam, she is moving the fingers of her left hand without pain.    Mild swelling present to the left hand, intact to light touch.  Past Medical History:  Diagnosis Date  . Allergy   . Chicken pox   . Chronic kidney disease   . Hyperlipidemia   . Hypertension   . Measles   .  Mumps   . Osteoarthritis   . Osteoporosis   . Periorbital cellulitis of left eye 01/04/2015  . Vitamin D deficiency     Assessment/Plan: 4 Days Post-Op Procedure(s) (LRB): OPEN REDUCTION INTERNAL FIXATION (ORIF) WRIST FRACTURE (Left) INTRAMEDULLARY (IM) NAIL INTERTROCHANTRIC- AFFIXUS (Left) Active Problems:   Hip fracture (HCC)  Estimated body mass index is 19.3 kg/m as calculated from the following:   Height as of this encounter: 5\' 5"  (1.651 m).   Weight as of this encounter: 52.6 kg (116 lb). Up with therapy Discharge to SNF when medically cleared  Pt has had a BM. Plan will be for discharge to SNF today. Follow-up with Dr. Rosita KeaMenz in 10-14 days for staple removal and placement into a velcro wrist splint.  Labs: None DVT Prophylaxis - Xarelto, Foot Pumps and TED hose Weight-Bearing as tolerated to left leg, non-weightbearing to the left hand.  Valeria BatmanJ. Lance Gorje Iyer, PA-C [facility] 12/31/2017, 7:46 AM

## 2017-12-31 NOTE — Care Management Important Message (Signed)
Important Message  Patient Details  Name: Tami [name] MRN: [id number] Date of Birth: July 15, 1923   Medicare Important Message Given:  Yes    Olegario MessierKathy A Ivan Maskell 12/31/2017, 10:53 AM

## 2017-12-31 NOTE — Telephone Encounter (Signed)
Pt's daughter called Okey DupreRose May CB# [phone] or [phone]  Please call back  Returned our call.

## 2017-12-31 NOTE — Progress Notes (Addendum)
[facility]. Report called. LBM 7/28. On RA. Splint to left arm intact. Dressing to left dressing is CD&I. Waiting for transport. Family is aware. IV removed with cath intact.

## 2017-12-31 NOTE — Progress Notes (Signed)
Patient is medically stable for D/[facility] has been received and patient can come today to room 320-P. RN will call report to [facility] and arrange EMS for transport. Clinical Child psychotherapistocial Worker (CSW) sent [facility] via [facility]. Patient is aware of above. Patient's daughter Tami [name] is at bedside and aware of above. Please reconsult if future social work needs arise. CSW signing off.   B[employer] Hughes IncorporatedBailey Arin Vanosdol, LCSW [phone]

## 2017-12-31 NOTE — Progress Notes (Signed)
Pt remaining alert and oriented but can be forgetful at times. Surgical dressing remaining dry and intact. Voiding without difficulty. Pt able to sleep in between care. Minimal pain, pain control with oral medications.

## 2017-12-31 NOTE — Clinical Social Work Placement (Signed)
   CLINICAL SOCIAL WORK PLACEMENT  NOTE  Date:  12/31/2017  Patient Details  Name: Tami [name] MRN: [id number] Date of Birth: 1924/03/11  Clinical Social Work is seeking post-discharge placement for this patient at the Skilled  Nursing Facility level of care (*CSW will initial, date and re-position this form in  chart as items are completed):  Yes   Patient/family provided with [place] Clinical Social Work Department's list of facilities offering this level of care within the geographic area requested by the patient (or if unable, by the patient's family).  Yes   Patient/family informed of their freedom to choose among providers that offer the needed level of care, that participate in Medicare, Medicaid or managed care program needed by the patient, have an available bed and are willing to accept the patient.  Yes   Patient/family informed of [facility]'s ownership interest in [facility] and [facility], as well as of the fact that they are under no obligation to receive care at these facilities.  PASRR submitted to EDS on 12/27/17     PASRR number received on 12/27/17     Existing PASRR number confirmed on       FL2 transmitted to all facilities in geographic area requested by pt/family on 12/27/17     FL2 transmitted to all facilities within larger geographic area on       Patient informed that his/her managed care company has contracts with or will negotiate with certain facilities, including the following:        Yes   Patient/family informed of bed offers received.  Patient chooses bed at [facility] )     Physician recommends and patient chooses bed at      Patient to be transferred to [facility] ) on 12/31/17.  Patient to be transferred to facility by [facility] )     Patient family notified on 12/31/17 of transfer.  Name of family member notified:  (Patient's daughter Okey DupreRose is at bedside and aware of D/C today. )     PHYSICIAN       Additional Comment:    _______________________________________________ Quade Ramirez, Darleen CrockerBailey M, LCSW 12/31/2017, 11:06 AM

## 2017-12-31 NOTE — Progress Notes (Signed)
[facility] admissions coordinator [facility] authorization has been received. [facility].   [employer]iley Tami Dano, LCSW [phone]

## 2017-12-31 NOTE — Discharge Instructions (Signed)
Follow-up with primary care physician at the facility in 2 to 3 days Continue physical therapy Will up with orthopedics Dr. Rosita KeaMenz  in 10-14 -days for staple Removal

## 2018-01-01 DIAGNOSIS — W57XXXA Bitten or stung by nonvenomous insect and other nonvenomous arthropods, initial encounter: Secondary | ICD-10-CM | POA: Diagnosis not present

## 2018-01-01 DIAGNOSIS — S72002D Fracture of unspecified part of neck of left femur, subsequent encounter for closed fracture with routine healing: Secondary | ICD-10-CM | POA: Diagnosis not present

## 2018-01-01 DIAGNOSIS — S62102D Fracture of unspecified carpal bone, left wrist, subsequent encounter for fracture with routine healing: Secondary | ICD-10-CM | POA: Diagnosis not present

## 2018-01-01 DIAGNOSIS — I482 Chronic atrial fibrillation: Secondary | ICD-10-CM | POA: Diagnosis not present

## 2018-01-17 DIAGNOSIS — S62102D Fracture of unspecified carpal bone, left wrist, subsequent encounter for fracture with routine healing: Secondary | ICD-10-CM | POA: Diagnosis not present

## 2018-01-17 DIAGNOSIS — I482 Chronic atrial fibrillation: Secondary | ICD-10-CM | POA: Diagnosis not present

## 2018-01-17 DIAGNOSIS — M810 Age-related osteoporosis without current pathological fracture: Secondary | ICD-10-CM | POA: Diagnosis not present

## 2018-01-17 DIAGNOSIS — S72002D Fracture of unspecified part of neck of left femur, subsequent encounter for closed fracture with routine healing: Secondary | ICD-10-CM | POA: Diagnosis not present

## 2018-01-22 ENCOUNTER — Telehealth: Payer: Self-pay | Admitting: Family Medicine

## 2018-01-22 DIAGNOSIS — M810 Age-related osteoporosis without current pathological fracture: Secondary | ICD-10-CM | POA: Diagnosis not present

## 2018-01-22 DIAGNOSIS — Z7901 Long term (current) use of anticoagulants: Secondary | ICD-10-CM | POA: Diagnosis not present

## 2018-01-22 DIAGNOSIS — H3530 Unspecified macular degeneration: Secondary | ICD-10-CM | POA: Diagnosis not present

## 2018-01-22 DIAGNOSIS — I482 Chronic atrial fibrillation: Secondary | ICD-10-CM | POA: Diagnosis not present

## 2018-01-22 DIAGNOSIS — M1990 Unspecified osteoarthritis, unspecified site: Secondary | ICD-10-CM | POA: Diagnosis not present

## 2018-01-22 DIAGNOSIS — I129 Hypertensive chronic kidney disease with stage 1 through stage 4 chronic kidney disease, or unspecified chronic kidney disease: Secondary | ICD-10-CM | POA: Diagnosis not present

## 2018-01-22 DIAGNOSIS — S62102D Fracture of unspecified carpal bone, left wrist, subsequent encounter for fracture with routine healing: Secondary | ICD-10-CM | POA: Diagnosis not present

## 2018-01-22 DIAGNOSIS — N181 Chronic kidney disease, stage 1: Secondary | ICD-10-CM | POA: Diagnosis not present

## 2018-01-22 DIAGNOSIS — E559 Vitamin D deficiency, unspecified: Secondary | ICD-10-CM | POA: Diagnosis not present

## 2018-01-22 DIAGNOSIS — E785 Hyperlipidemia, unspecified: Secondary | ICD-10-CM | POA: Diagnosis not present

## 2018-01-22 DIAGNOSIS — S72002D Fracture of unspecified part of neck of left femur, subsequent encounter for closed fracture with routine healing: Secondary | ICD-10-CM | POA: Diagnosis not present

## 2018-01-22 DIAGNOSIS — S52502D Unspecified fracture of the lower end of left radius, subsequent encounter for closed fracture with routine healing: Secondary | ICD-10-CM | POA: Diagnosis not present

## 2018-01-22 DIAGNOSIS — Z9181 History of falling: Secondary | ICD-10-CM | POA: Diagnosis not present

## 2018-01-22 NOTE — Telephone Encounter (Signed)
I can sign it, but just let them know she has not been seen since 09-04-2017.

## 2018-01-22 NOTE — Telephone Encounter (Signed)
Barbara with [facility] called to ask if Dr SheBritta Mccreedyrrie Mustachefisher would sign a plan of care that they are going to fax to us.  She will be faxing at the end of the week.once all the therapist have done their evaluations.  Their call back is [phone]  Thanks Barth Kirksteri

## 2018-01-22 NOTE — Telephone Encounter (Signed)
Please advise 

## 2018-01-23 DIAGNOSIS — H3530 Unspecified macular degeneration: Secondary | ICD-10-CM | POA: Diagnosis not present

## 2018-01-23 DIAGNOSIS — Z9181 History of falling: Secondary | ICD-10-CM | POA: Diagnosis not present

## 2018-01-23 DIAGNOSIS — E785 Hyperlipidemia, unspecified: Secondary | ICD-10-CM | POA: Diagnosis not present

## 2018-01-23 DIAGNOSIS — M1990 Unspecified osteoarthritis, unspecified site: Secondary | ICD-10-CM | POA: Diagnosis not present

## 2018-01-23 DIAGNOSIS — Z7901 Long term (current) use of anticoagulants: Secondary | ICD-10-CM | POA: Diagnosis not present

## 2018-01-23 DIAGNOSIS — I482 Chronic atrial fibrillation: Secondary | ICD-10-CM | POA: Diagnosis not present

## 2018-01-23 DIAGNOSIS — S62102D Fracture of unspecified carpal bone, left wrist, subsequent encounter for fracture with routine healing: Secondary | ICD-10-CM | POA: Diagnosis not present

## 2018-01-23 DIAGNOSIS — M810 Age-related osteoporosis without current pathological fracture: Secondary | ICD-10-CM | POA: Diagnosis not present

## 2018-01-23 DIAGNOSIS — I129 Hypertensive chronic kidney disease with stage 1 through stage 4 chronic kidney disease, or unspecified chronic kidney disease: Secondary | ICD-10-CM | POA: Diagnosis not present

## 2018-01-23 DIAGNOSIS — E559 Vitamin D deficiency, unspecified: Secondary | ICD-10-CM | POA: Diagnosis not present

## 2018-01-23 DIAGNOSIS — N181 Chronic kidney disease, stage 1: Secondary | ICD-10-CM | POA: Diagnosis not present

## 2018-01-23 DIAGNOSIS — S72002D Fracture of unspecified part of neck of left femur, subsequent encounter for closed fracture with routine healing: Secondary | ICD-10-CM | POA: Diagnosis not present

## 2018-01-23 DIAGNOSIS — S52502D Unspecified fracture of the lower end of left radius, subsequent encounter for closed fracture with routine healing: Secondary | ICD-10-CM | POA: Diagnosis not present

## 2018-01-23 NOTE — Telephone Encounter (Addendum)
Britta MccreedyBarbara was advised. Britta MccreedyBarbara stated she spoke to pt's daughter yesterday who said she will be be calling our office to schedule an appt.

## 2018-01-24 ENCOUNTER — Telehealth: Payer: Self-pay

## 2018-01-24 DIAGNOSIS — I129 Hypertensive chronic kidney disease with stage 1 through stage 4 chronic kidney disease, or unspecified chronic kidney disease: Secondary | ICD-10-CM | POA: Diagnosis not present

## 2018-01-24 DIAGNOSIS — S52502D Unspecified fracture of the lower end of left radius, subsequent encounter for closed fracture with routine healing: Secondary | ICD-10-CM | POA: Diagnosis not present

## 2018-01-24 DIAGNOSIS — N181 Chronic kidney disease, stage 1: Secondary | ICD-10-CM | POA: Diagnosis not present

## 2018-01-24 DIAGNOSIS — M1990 Unspecified osteoarthritis, unspecified site: Secondary | ICD-10-CM | POA: Diagnosis not present

## 2018-01-24 DIAGNOSIS — H3530 Unspecified macular degeneration: Secondary | ICD-10-CM | POA: Diagnosis not present

## 2018-01-24 DIAGNOSIS — S72002D Fracture of unspecified part of neck of left femur, subsequent encounter for closed fracture with routine healing: Secondary | ICD-10-CM | POA: Diagnosis not present

## 2018-01-24 DIAGNOSIS — Z9181 History of falling: Secondary | ICD-10-CM | POA: Diagnosis not present

## 2018-01-24 DIAGNOSIS — S62102D Fracture of unspecified carpal bone, left wrist, subsequent encounter for fracture with routine healing: Secondary | ICD-10-CM | POA: Diagnosis not present

## 2018-01-24 DIAGNOSIS — Z7901 Long term (current) use of anticoagulants: Secondary | ICD-10-CM | POA: Diagnosis not present

## 2018-01-24 DIAGNOSIS — E559 Vitamin D deficiency, unspecified: Secondary | ICD-10-CM | POA: Diagnosis not present

## 2018-01-24 DIAGNOSIS — M810 Age-related osteoporosis without current pathological fracture: Secondary | ICD-10-CM | POA: Diagnosis not present

## 2018-01-24 DIAGNOSIS — E785 Hyperlipidemia, unspecified: Secondary | ICD-10-CM | POA: Diagnosis not present

## 2018-01-24 DIAGNOSIS — I482 Chronic atrial fibrillation: Secondary | ICD-10-CM | POA: Diagnosis not present

## 2018-01-24 NOTE — Telephone Encounter (Signed)
Occupational therapist Judeth CornfieldStephanie called requesting verbal orders for patient to be seen for occupational therapy 1 times a week for 1 week, then 2 times a week for 4 weeks. Call back is [phone].

## 2018-01-24 NOTE — Telephone Encounter (Signed)
Patients daughter Okey DupreRose called wanting to know if patient is supposed to be taking Benazepril? This mediation is not listed in patients active medication list. [facility] and while there, was not given this medication. Her blood pressure readings have been normal. [name] states that the occupational therapist took her blood pressure today and it was 120/75. [name] just wants to be sure she is taking the right medications. She has an appointment for a follow up with you on 01/30/2018 at 3:20pm. Please advise.

## 2018-01-25 ENCOUNTER — Telehealth: Payer: Self-pay | Admitting: Family Medicine

## 2018-01-25 DIAGNOSIS — Z7901 Long term (current) use of anticoagulants: Secondary | ICD-10-CM | POA: Diagnosis not present

## 2018-01-25 DIAGNOSIS — S72002D Fracture of unspecified part of neck of left femur, subsequent encounter for closed fracture with routine healing: Secondary | ICD-10-CM | POA: Diagnosis not present

## 2018-01-25 DIAGNOSIS — Z9181 History of falling: Secondary | ICD-10-CM | POA: Diagnosis not present

## 2018-01-25 DIAGNOSIS — I482 Chronic atrial fibrillation: Secondary | ICD-10-CM | POA: Diagnosis not present

## 2018-01-25 DIAGNOSIS — M810 Age-related osteoporosis without current pathological fracture: Secondary | ICD-10-CM | POA: Diagnosis not present

## 2018-01-25 DIAGNOSIS — E785 Hyperlipidemia, unspecified: Secondary | ICD-10-CM | POA: Diagnosis not present

## 2018-01-25 DIAGNOSIS — S62102D Fracture of unspecified carpal bone, left wrist, subsequent encounter for fracture with routine healing: Secondary | ICD-10-CM | POA: Diagnosis not present

## 2018-01-25 DIAGNOSIS — E559 Vitamin D deficiency, unspecified: Secondary | ICD-10-CM | POA: Diagnosis not present

## 2018-01-25 DIAGNOSIS — S52502D Unspecified fracture of the lower end of left radius, subsequent encounter for closed fracture with routine healing: Secondary | ICD-10-CM | POA: Diagnosis not present

## 2018-01-25 DIAGNOSIS — I129 Hypertensive chronic kidney disease with stage 1 through stage 4 chronic kidney disease, or unspecified chronic kidney disease: Secondary | ICD-10-CM | POA: Diagnosis not present

## 2018-01-25 DIAGNOSIS — N181 Chronic kidney disease, stage 1: Secondary | ICD-10-CM | POA: Diagnosis not present

## 2018-01-25 DIAGNOSIS — M1990 Unspecified osteoarthritis, unspecified site: Secondary | ICD-10-CM | POA: Diagnosis not present

## 2018-01-25 DIAGNOSIS — H3530 Unspecified macular degeneration: Secondary | ICD-10-CM | POA: Diagnosis not present

## 2018-01-25 NOTE — Telephone Encounter (Signed)
That's fine

## 2018-01-25 NOTE — Telephone Encounter (Signed)
Left detailed message on Matt's vm giving verbal ok.

## 2018-01-25 NOTE — Telephone Encounter (Signed)
Please advise 

## 2018-01-25 NOTE — Telephone Encounter (Signed)
[name] was advised.  

## 2018-01-25 NOTE — Telephone Encounter (Signed)
Tami CornfieldStephanie was advised.

## 2018-01-25 NOTE — Telephone Encounter (Signed)
No, she is not supposed to be taking it.

## 2018-01-25 NOTE — Telephone Encounter (Signed)
Matt with [facility] needs physical therapy orders for 1 time this week and then 2 times for the next 4 weeks.   Please call with verbal ok.

## 2018-01-28 DIAGNOSIS — Z7901 Long term (current) use of anticoagulants: Secondary | ICD-10-CM | POA: Diagnosis not present

## 2018-01-28 DIAGNOSIS — S52502D Unspecified fracture of the lower end of left radius, subsequent encounter for closed fracture with routine healing: Secondary | ICD-10-CM | POA: Diagnosis not present

## 2018-01-28 DIAGNOSIS — E785 Hyperlipidemia, unspecified: Secondary | ICD-10-CM | POA: Diagnosis not present

## 2018-01-28 DIAGNOSIS — Z9181 History of falling: Secondary | ICD-10-CM | POA: Diagnosis not present

## 2018-01-28 DIAGNOSIS — M810 Age-related osteoporosis without current pathological fracture: Secondary | ICD-10-CM | POA: Diagnosis not present

## 2018-01-28 DIAGNOSIS — S62102D Fracture of unspecified carpal bone, left wrist, subsequent encounter for fracture with routine healing: Secondary | ICD-10-CM | POA: Diagnosis not present

## 2018-01-28 DIAGNOSIS — I129 Hypertensive chronic kidney disease with stage 1 through stage 4 chronic kidney disease, or unspecified chronic kidney disease: Secondary | ICD-10-CM | POA: Diagnosis not present

## 2018-01-28 DIAGNOSIS — E559 Vitamin D deficiency, unspecified: Secondary | ICD-10-CM | POA: Diagnosis not present

## 2018-01-28 DIAGNOSIS — H3530 Unspecified macular degeneration: Secondary | ICD-10-CM | POA: Diagnosis not present

## 2018-01-28 DIAGNOSIS — I482 Chronic atrial fibrillation: Secondary | ICD-10-CM | POA: Diagnosis not present

## 2018-01-28 DIAGNOSIS — N181 Chronic kidney disease, stage 1: Secondary | ICD-10-CM | POA: Diagnosis not present

## 2018-01-28 DIAGNOSIS — S72002D Fracture of unspecified part of neck of left femur, subsequent encounter for closed fracture with routine healing: Secondary | ICD-10-CM | POA: Diagnosis not present

## 2018-01-28 DIAGNOSIS — M1990 Unspecified osteoarthritis, unspecified site: Secondary | ICD-10-CM | POA: Diagnosis not present

## 2018-01-29 DIAGNOSIS — E785 Hyperlipidemia, unspecified: Secondary | ICD-10-CM | POA: Diagnosis not present

## 2018-01-29 DIAGNOSIS — S72002D Fracture of unspecified part of neck of left femur, subsequent encounter for closed fracture with routine healing: Secondary | ICD-10-CM | POA: Diagnosis not present

## 2018-01-29 DIAGNOSIS — N181 Chronic kidney disease, stage 1: Secondary | ICD-10-CM | POA: Diagnosis not present

## 2018-01-29 DIAGNOSIS — S62102D Fracture of unspecified carpal bone, left wrist, subsequent encounter for fracture with routine healing: Secondary | ICD-10-CM | POA: Diagnosis not present

## 2018-01-29 DIAGNOSIS — I482 Chronic atrial fibrillation: Secondary | ICD-10-CM | POA: Diagnosis not present

## 2018-01-29 DIAGNOSIS — E559 Vitamin D deficiency, unspecified: Secondary | ICD-10-CM | POA: Diagnosis not present

## 2018-01-29 DIAGNOSIS — S52502D Unspecified fracture of the lower end of left radius, subsequent encounter for closed fracture with routine healing: Secondary | ICD-10-CM | POA: Diagnosis not present

## 2018-01-29 DIAGNOSIS — H3530 Unspecified macular degeneration: Secondary | ICD-10-CM | POA: Diagnosis not present

## 2018-01-29 DIAGNOSIS — Z7901 Long term (current) use of anticoagulants: Secondary | ICD-10-CM | POA: Diagnosis not present

## 2018-01-29 DIAGNOSIS — M1990 Unspecified osteoarthritis, unspecified site: Secondary | ICD-10-CM | POA: Diagnosis not present

## 2018-01-29 DIAGNOSIS — I129 Hypertensive chronic kidney disease with stage 1 through stage 4 chronic kidney disease, or unspecified chronic kidney disease: Secondary | ICD-10-CM | POA: Diagnosis not present

## 2018-01-29 DIAGNOSIS — M810 Age-related osteoporosis without current pathological fracture: Secondary | ICD-10-CM | POA: Diagnosis not present

## 2018-01-29 DIAGNOSIS — Z9181 History of falling: Secondary | ICD-10-CM | POA: Diagnosis not present

## 2018-01-30 ENCOUNTER — Encounter: Payer: Self-pay | Admitting: Family Medicine

## 2018-01-30 ENCOUNTER — Ambulatory Visit (INDEPENDENT_AMBULATORY_CARE_PROVIDER_SITE_OTHER): Payer: Medicare Other | Admitting: Family Medicine

## 2018-01-30 VITALS — BP 128/80 | HR 100 | Temp 98.6°F | Resp 16 | Wt 119.0 lb

## 2018-01-30 DIAGNOSIS — S72002A Fracture of unspecified part of neck of left femur, initial encounter for closed fracture: Secondary | ICD-10-CM | POA: Diagnosis not present

## 2018-01-30 DIAGNOSIS — M810 Age-related osteoporosis without current pathological fracture: Secondary | ICD-10-CM

## 2018-01-30 MED ORDER — RISEDRONATE SODIUM 35 MG PO TABS: 35.0000 mg | ORAL_TABLET | ORAL | 2 refills | Status: DC

## 2018-01-30 NOTE — Progress Notes (Signed)
Patient: Tami [name] Female    DOB: [birth year]   84 y.o.   MRN: [id number] Visit Date: 01/30/2018  Today's Provider: Mila Merry, MD   Chief Complaint  Patient presents with  . Transitions Of Care   Subjective:    HPI   Follow up Hospitalization/ Transition of Care  Patient was admitted to [facility]  on 12/26/2017 and discharged on 12/31/2017. She was treated for Hip and wrist Fracture. Treatment for this included; patient had surgery of left hip and left hand on 12/27/2017. Patient was discharged to skilled nursing facility [facility] Patient was discharged from [facility] on 01/20/2018.  She reports good compliance with treatment. She reports this condition is Improved. She has follow up with Dr. Rosita Kea next week. She is ambulating with walker.  Denies any shortness of breath or palpations.   She does have osteoporosis and prescribed alendronate in the past which she stopped after a few months due to upset stomach. She has refused trying other medications since then, but is now willing to try something to help prevent additional fractures.  ------------------------------------------------------------------------------------     Allergies  Allergen Reactions  . Maxzide  [Hydrochlorothiazide W-Triamterene]   . Sulfa Antibiotics      Current Outpatient Medications:  .  acetaminophen (TYLENOL) 325 MG tablet, Take 1-2 tablets (325-650 mg total) by mouth every 6 (six) hours as needed for mild pain (pain score 1-3 or temp > 100.5)., Disp: , Rfl:  .  Cholecalciferol (VITAMIN D3) 2000 UNITS CHEW, Chew 1 tablet by mouth daily., Disp: 1 tablet, Rfl: 2 .  docusate sodium (COLACE) 100 MG capsule, Take 1 capsule (100 mg total) by mouth 2 (two) times daily., Disp: 10 capsule, Rfl: 0 .  loratadine (CLARITIN) 10 MG tablet, Take by mouth daily. , Disp: , Rfl:  .  polyethylene glycol (MIRALAX / GLYCOLAX) packet, Take 17 g by mouth daily as needed for mild constipation.,  Disp: 14 each, Rfl: 0 .  Rivaroxaban (XARELTO) 15 MG TABS tablet, Take 1 tablet (15 mg total) by mouth daily with supper., Disp: 30 tablet, Rfl: 12 .  verapamil (CALAN-SR) 240 MG CR tablet, TAKE ONE TABLET BY MOUTH DAILY, Disp: 30 tablet, Rfl: 11 .  alum & mag hydroxide-simeth (MAALOX/MYLANTA) 200-200-20 MG/5ML suspension, Take 30 mLs by mouth every 4 (four) hours as needed for indigestion. (Patient not taking: Reported on 01/30/2018), Disp: 355 mL, Rfl: 0 .  HYDROcodone-acetaminophen (NORCO/VICODIN) 5-325 MG tablet, Take 1-2 tablets by mouth every 6 (six) hours as needed for moderate pain. (Patient not taking: Reported on 01/30/2018), Disp: 20 tablet, Rfl: 0 .  methocarbamol (ROBAXIN) 500 MG tablet, Take 1 tablet (500 mg total) by mouth every 6 (six) hours as needed for muscle spasms. (Patient not taking: Reported on 01/30/2018), Disp: 20 tablet, Rfl: 0 .  ondansetron (ZOFRAN) 4 MG tablet, Take 1 tablet (4 mg total) by mouth every 6 (six) hours as needed for nausea. (Patient not taking: Reported on 01/30/2018), Disp: 20 tablet, Rfl: 0  Review of Systems  Constitutional: Negative for appetite change, chills, fatigue and fever.  Respiratory: Negative for chest tightness and shortness of breath.   Cardiovascular: Negative for chest pain and palpitations.  Gastrointestinal: Negative for abdominal pain, nausea and vomiting.  Neurological: Negative for dizziness and weakness.  Hematological: Bruises/bleeds easily.    Social History   Tobacco Use  . Smoking status: Never Smoker  . Smokeless tobacco: Never Used  Substance Use Topics  .  Alcohol use: No    Alcohol/week: 0.0 standard drinks   Objective:   BP 128/80 (BP Location: Right Arm, Patient Position: Sitting, Cuff Size: Large)   Pulse 100   Temp 98.6 F (37 C) (Oral)   Resp 16   Wt 119 lb (54 kg)   SpO2 98% Comment: room air  BMI 19.80 kg/m  Vitals:   01/30/18 1532  BP: 128/80  Pulse: 100  Resp: 16  Temp: 98.6 F (37 C)  TempSrc:  Oral  SpO2: 98%  Weight: 119 lb (54 kg)     Physical Exam   General Appearance:    Alert, cooperative, no distress  Eyes:    PERRL, conjunctiva/corneas clear, EOM's intact       Lungs:     Clear to auscultation bilaterally, respirations unlabored  Heart:    Regular rate and rhythm  Neurologic:   Awake, alert, oriented x 3. No apparent focal neurological           defect.          Assessment & Plan:     1. Closed fracture of left hip, initial encounter [facility](HCC) Doing well post op. Follow up Dr. Rosita KeaMenz as scheduled.   2. Osteoporosis, unspecified osteoporosis type, unspecified pathological fracture presence Did not tolerate alendronate in the past. She is now willing to try another medications.  Start- risedronate (ACTONEL) 35 MG tablet; Take 1 tablet (35 mg total) by mouth every 7 (seven) days. with water on empty stomach, nothing by mouth or lie down for next 30 minutes.  Dispense: 12 tablet; Refill: 2  Advised to call if not tolerating in such case will refer for injectable treatment.   Return in about 3 months (around 05/02/2018).       Mila Merryonald Arely Tinner, MD  [facility]

## 2018-01-31 DIAGNOSIS — N181 Chronic kidney disease, stage 1: Secondary | ICD-10-CM | POA: Diagnosis not present

## 2018-01-31 DIAGNOSIS — E785 Hyperlipidemia, unspecified: Secondary | ICD-10-CM | POA: Diagnosis not present

## 2018-01-31 DIAGNOSIS — M810 Age-related osteoporosis without current pathological fracture: Secondary | ICD-10-CM | POA: Diagnosis not present

## 2018-01-31 DIAGNOSIS — Z9181 History of falling: Secondary | ICD-10-CM | POA: Diagnosis not present

## 2018-01-31 DIAGNOSIS — M1990 Unspecified osteoarthritis, unspecified site: Secondary | ICD-10-CM | POA: Diagnosis not present

## 2018-01-31 DIAGNOSIS — Z7901 Long term (current) use of anticoagulants: Secondary | ICD-10-CM | POA: Diagnosis not present

## 2018-01-31 DIAGNOSIS — E559 Vitamin D deficiency, unspecified: Secondary | ICD-10-CM | POA: Diagnosis not present

## 2018-01-31 DIAGNOSIS — I482 Chronic atrial fibrillation: Secondary | ICD-10-CM | POA: Diagnosis not present

## 2018-01-31 DIAGNOSIS — I129 Hypertensive chronic kidney disease with stage 1 through stage 4 chronic kidney disease, or unspecified chronic kidney disease: Secondary | ICD-10-CM | POA: Diagnosis not present

## 2018-01-31 DIAGNOSIS — S52502D Unspecified fracture of the lower end of left radius, subsequent encounter for closed fracture with routine healing: Secondary | ICD-10-CM | POA: Diagnosis not present

## 2018-01-31 DIAGNOSIS — H3530 Unspecified macular degeneration: Secondary | ICD-10-CM | POA: Diagnosis not present

## 2018-01-31 DIAGNOSIS — S72002D Fracture of unspecified part of neck of left femur, subsequent encounter for closed fracture with routine healing: Secondary | ICD-10-CM | POA: Diagnosis not present

## 2018-01-31 DIAGNOSIS — S62102D Fracture of unspecified carpal bone, left wrist, subsequent encounter for fracture with routine healing: Secondary | ICD-10-CM | POA: Diagnosis not present

## 2018-02-05 DIAGNOSIS — E785 Hyperlipidemia, unspecified: Secondary | ICD-10-CM | POA: Diagnosis not present

## 2018-02-05 DIAGNOSIS — N181 Chronic kidney disease, stage 1: Secondary | ICD-10-CM | POA: Diagnosis not present

## 2018-02-05 DIAGNOSIS — I482 Chronic atrial fibrillation: Secondary | ICD-10-CM | POA: Diagnosis not present

## 2018-02-05 DIAGNOSIS — M810 Age-related osteoporosis without current pathological fracture: Secondary | ICD-10-CM | POA: Diagnosis not present

## 2018-02-05 DIAGNOSIS — Z9181 History of falling: Secondary | ICD-10-CM | POA: Diagnosis not present

## 2018-02-05 DIAGNOSIS — S72002D Fracture of unspecified part of neck of left femur, subsequent encounter for closed fracture with routine healing: Secondary | ICD-10-CM | POA: Diagnosis not present

## 2018-02-05 DIAGNOSIS — H3530 Unspecified macular degeneration: Secondary | ICD-10-CM | POA: Diagnosis not present

## 2018-02-05 DIAGNOSIS — E559 Vitamin D deficiency, unspecified: Secondary | ICD-10-CM | POA: Diagnosis not present

## 2018-02-05 DIAGNOSIS — I129 Hypertensive chronic kidney disease with stage 1 through stage 4 chronic kidney disease, or unspecified chronic kidney disease: Secondary | ICD-10-CM | POA: Diagnosis not present

## 2018-02-05 DIAGNOSIS — S52502D Unspecified fracture of the lower end of left radius, subsequent encounter for closed fracture with routine healing: Secondary | ICD-10-CM | POA: Diagnosis not present

## 2018-02-05 DIAGNOSIS — M1990 Unspecified osteoarthritis, unspecified site: Secondary | ICD-10-CM | POA: Diagnosis not present

## 2018-02-05 DIAGNOSIS — Z7901 Long term (current) use of anticoagulants: Secondary | ICD-10-CM | POA: Diagnosis not present

## 2018-02-05 DIAGNOSIS — S62102D Fracture of unspecified carpal bone, left wrist, subsequent encounter for fracture with routine healing: Secondary | ICD-10-CM | POA: Diagnosis not present

## 2018-02-06 DIAGNOSIS — Z9889 Other specified postprocedural states: Secondary | ICD-10-CM | POA: Diagnosis not present

## 2018-02-06 DIAGNOSIS — Z8781 Personal history of (healed) traumatic fracture: Secondary | ICD-10-CM | POA: Diagnosis not present

## 2018-02-07 DIAGNOSIS — S62102D Fracture of unspecified carpal bone, left wrist, subsequent encounter for fracture with routine healing: Secondary | ICD-10-CM | POA: Diagnosis not present

## 2018-02-07 DIAGNOSIS — S52502D Unspecified fracture of the lower end of left radius, subsequent encounter for closed fracture with routine healing: Secondary | ICD-10-CM | POA: Diagnosis not present

## 2018-02-07 DIAGNOSIS — E785 Hyperlipidemia, unspecified: Secondary | ICD-10-CM | POA: Diagnosis not present

## 2018-02-07 DIAGNOSIS — Z7901 Long term (current) use of anticoagulants: Secondary | ICD-10-CM | POA: Diagnosis not present

## 2018-02-07 DIAGNOSIS — Z9181 History of falling: Secondary | ICD-10-CM | POA: Diagnosis not present

## 2018-02-07 DIAGNOSIS — M810 Age-related osteoporosis without current pathological fracture: Secondary | ICD-10-CM | POA: Diagnosis not present

## 2018-02-07 DIAGNOSIS — H3530 Unspecified macular degeneration: Secondary | ICD-10-CM | POA: Diagnosis not present

## 2018-02-07 DIAGNOSIS — S72002D Fracture of unspecified part of neck of left femur, subsequent encounter for closed fracture with routine healing: Secondary | ICD-10-CM | POA: Diagnosis not present

## 2018-02-07 DIAGNOSIS — E559 Vitamin D deficiency, unspecified: Secondary | ICD-10-CM | POA: Diagnosis not present

## 2018-02-07 DIAGNOSIS — I129 Hypertensive chronic kidney disease with stage 1 through stage 4 chronic kidney disease, or unspecified chronic kidney disease: Secondary | ICD-10-CM | POA: Diagnosis not present

## 2018-02-07 DIAGNOSIS — I482 Chronic atrial fibrillation: Secondary | ICD-10-CM | POA: Diagnosis not present

## 2018-02-07 DIAGNOSIS — M1990 Unspecified osteoarthritis, unspecified site: Secondary | ICD-10-CM | POA: Diagnosis not present

## 2018-02-07 DIAGNOSIS — N181 Chronic kidney disease, stage 1: Secondary | ICD-10-CM | POA: Diagnosis not present

## 2018-02-08 ENCOUNTER — Telehealth: Payer: Self-pay | Admitting: Family Medicine

## 2018-02-08 DIAGNOSIS — S72002D Fracture of unspecified part of neck of left femur, subsequent encounter for closed fracture with routine healing: Secondary | ICD-10-CM | POA: Diagnosis not present

## 2018-02-08 DIAGNOSIS — S52502D Unspecified fracture of the lower end of left radius, subsequent encounter for closed fracture with routine healing: Secondary | ICD-10-CM | POA: Diagnosis not present

## 2018-02-08 DIAGNOSIS — S62102D Fracture of unspecified carpal bone, left wrist, subsequent encounter for fracture with routine healing: Secondary | ICD-10-CM | POA: Diagnosis not present

## 2018-02-08 DIAGNOSIS — Z9181 History of falling: Secondary | ICD-10-CM | POA: Diagnosis not present

## 2018-02-08 DIAGNOSIS — I129 Hypertensive chronic kidney disease with stage 1 through stage 4 chronic kidney disease, or unspecified chronic kidney disease: Secondary | ICD-10-CM | POA: Diagnosis not present

## 2018-02-08 DIAGNOSIS — E785 Hyperlipidemia, unspecified: Secondary | ICD-10-CM | POA: Diagnosis not present

## 2018-02-08 DIAGNOSIS — M810 Age-related osteoporosis without current pathological fracture: Secondary | ICD-10-CM | POA: Diagnosis not present

## 2018-02-08 DIAGNOSIS — N181 Chronic kidney disease, stage 1: Secondary | ICD-10-CM | POA: Diagnosis not present

## 2018-02-08 DIAGNOSIS — Z7901 Long term (current) use of anticoagulants: Secondary | ICD-10-CM | POA: Diagnosis not present

## 2018-02-08 DIAGNOSIS — E559 Vitamin D deficiency, unspecified: Secondary | ICD-10-CM | POA: Diagnosis not present

## 2018-02-08 DIAGNOSIS — H3530 Unspecified macular degeneration: Secondary | ICD-10-CM | POA: Diagnosis not present

## 2018-02-08 DIAGNOSIS — I482 Chronic atrial fibrillation: Secondary | ICD-10-CM | POA: Diagnosis not present

## 2018-02-08 DIAGNOSIS — M1990 Unspecified osteoarthritis, unspecified site: Secondary | ICD-10-CM | POA: Diagnosis not present

## 2018-02-08 MED ORDER — SPIRONOLACTONE 25 MG PO TABS: 25.0000 mg | ORAL_TABLET | Freq: Every day | ORAL | 2 refills | Status: DC

## 2018-02-08 NOTE — Telephone Encounter (Signed)
Stephanie with [facility] was at the patients home this morning and said her blood pressure is laying in bed, 154/84,  sitting 178/91..  Last one was 171/95 that was after OT  Please advise [phone]  Thanks teri

## 2018-02-08 NOTE — Telephone Encounter (Signed)
OK,please call [facility] and cancel spironolactone prescription. She should start giving her the benazepril again. I think it was when she was in rehab, but she needs to be back on it.

## 2018-02-08 NOTE — Telephone Encounter (Signed)
Prescription was canceled.

## 2018-02-08 NOTE — Telephone Encounter (Signed)
Per [name] her mother's blood pressure get high if she gets mad and aggravated and once she is calm she is ok and does well. [name] states that she doesn't think her mother needs to be put on a new medicine (Spironolactone). Reports that patient takes Verapamil and was taken off the Benazepril. She states that if patient needs to be put on another medicine that she prefers for her to be place back on the Benazepril because patient was doing good. Reports that she has 11 pills left of the Benazepril.  Thanks,  -Joseline

## 2018-02-08 NOTE — Telephone Encounter (Signed)
L/M advising Tami [name] and patient's daughter Tami [name]).

## 2018-02-08 NOTE — Telephone Encounter (Signed)
Can add spironolactone, have [facility].

## 2018-02-08 NOTE — Telephone Encounter (Signed)
Please review below. Thanks!  

## 2018-02-08 NOTE — Telephone Encounter (Signed)
Please advise 

## 2018-02-12 ENCOUNTER — Other Ambulatory Visit: Payer: Self-pay | Admitting: Family Medicine

## 2018-02-12 ENCOUNTER — Telehealth: Payer: Self-pay | Admitting: Family Medicine

## 2018-02-12 DIAGNOSIS — Z9181 History of falling: Secondary | ICD-10-CM | POA: Diagnosis not present

## 2018-02-12 DIAGNOSIS — S62102D Fracture of unspecified carpal bone, left wrist, subsequent encounter for fracture with routine healing: Secondary | ICD-10-CM | POA: Diagnosis not present

## 2018-02-12 DIAGNOSIS — S52502D Unspecified fracture of the lower end of left radius, subsequent encounter for closed fracture with routine healing: Secondary | ICD-10-CM | POA: Diagnosis not present

## 2018-02-12 DIAGNOSIS — E785 Hyperlipidemia, unspecified: Secondary | ICD-10-CM | POA: Diagnosis not present

## 2018-02-12 DIAGNOSIS — I482 Chronic atrial fibrillation: Secondary | ICD-10-CM | POA: Diagnosis not present

## 2018-02-12 DIAGNOSIS — M1990 Unspecified osteoarthritis, unspecified site: Secondary | ICD-10-CM | POA: Diagnosis not present

## 2018-02-12 DIAGNOSIS — Z7901 Long term (current) use of anticoagulants: Secondary | ICD-10-CM | POA: Diagnosis not present

## 2018-02-12 DIAGNOSIS — M810 Age-related osteoporosis without current pathological fracture: Secondary | ICD-10-CM | POA: Diagnosis not present

## 2018-02-12 DIAGNOSIS — I129 Hypertensive chronic kidney disease with stage 1 through stage 4 chronic kidney disease, or unspecified chronic kidney disease: Secondary | ICD-10-CM | POA: Diagnosis not present

## 2018-02-12 DIAGNOSIS — H3530 Unspecified macular degeneration: Secondary | ICD-10-CM | POA: Diagnosis not present

## 2018-02-12 DIAGNOSIS — N181 Chronic kidney disease, stage 1: Secondary | ICD-10-CM | POA: Diagnosis not present

## 2018-02-12 DIAGNOSIS — E559 Vitamin D deficiency, unspecified: Secondary | ICD-10-CM | POA: Diagnosis not present

## 2018-02-12 DIAGNOSIS — S72002D Fracture of unspecified part of neck of left femur, subsequent encounter for closed fracture with routine healing: Secondary | ICD-10-CM | POA: Diagnosis not present

## 2018-02-12 NOTE — Telephone Encounter (Signed)
Pt's daughter Okey Dupre is requesting Rx for benazepril (LOTENSIN) 10 MG tablet to be resent to [facility] because they advised they haven't received Rx. I called [facility] and Marchelle Folks confirmed they haven't received Rx.   Also, [name] stated that spironolactone (ALDACTONE) 25 MG tablet was going to cost over 600$ out of pocket and pt can't afford that. [name] stated that pt stated she is 84 years old and she doesn't want to take anything for her bones. [name] isn't requesting a cheaper medication because pt doesn't want it. Please advise. Thanks TNP

## 2018-02-12 NOTE — Telephone Encounter (Signed)
Please advise 

## 2018-02-13 NOTE — Telephone Encounter (Signed)
Please clarify spironolactone prescription. spirinolactone  is the BP medication which was changed to benazepril. Please make sure she is not taking spironolactone.   The medication for bone density is Actonel (risedronate) if this is the one that is too expensive then we can refer her for injections of Prolia which is covered by Medicare.

## 2018-02-14 DIAGNOSIS — S52502D Unspecified fracture of the lower end of left radius, subsequent encounter for closed fracture with routine healing: Secondary | ICD-10-CM | POA: Diagnosis not present

## 2018-02-14 DIAGNOSIS — S72002D Fracture of unspecified part of neck of left femur, subsequent encounter for closed fracture with routine healing: Secondary | ICD-10-CM | POA: Diagnosis not present

## 2018-02-14 DIAGNOSIS — M810 Age-related osteoporosis without current pathological fracture: Secondary | ICD-10-CM | POA: Diagnosis not present

## 2018-02-14 DIAGNOSIS — M1990 Unspecified osteoarthritis, unspecified site: Secondary | ICD-10-CM | POA: Diagnosis not present

## 2018-02-14 DIAGNOSIS — H3530 Unspecified macular degeneration: Secondary | ICD-10-CM | POA: Diagnosis not present

## 2018-02-14 DIAGNOSIS — I482 Chronic atrial fibrillation: Secondary | ICD-10-CM | POA: Diagnosis not present

## 2018-02-14 DIAGNOSIS — Z9181 History of falling: Secondary | ICD-10-CM | POA: Diagnosis not present

## 2018-02-14 DIAGNOSIS — N181 Chronic kidney disease, stage 1: Secondary | ICD-10-CM | POA: Diagnosis not present

## 2018-02-14 DIAGNOSIS — Z7901 Long term (current) use of anticoagulants: Secondary | ICD-10-CM | POA: Diagnosis not present

## 2018-02-14 DIAGNOSIS — S62102D Fracture of unspecified carpal bone, left wrist, subsequent encounter for fracture with routine healing: Secondary | ICD-10-CM | POA: Diagnosis not present

## 2018-02-14 DIAGNOSIS — E559 Vitamin D deficiency, unspecified: Secondary | ICD-10-CM | POA: Diagnosis not present

## 2018-02-14 DIAGNOSIS — E785 Hyperlipidemia, unspecified: Secondary | ICD-10-CM | POA: Diagnosis not present

## 2018-02-14 DIAGNOSIS — I129 Hypertensive chronic kidney disease with stage 1 through stage 4 chronic kidney disease, or unspecified chronic kidney disease: Secondary | ICD-10-CM | POA: Diagnosis not present

## 2018-02-14 NOTE — Telephone Encounter (Signed)
Spoke with daughter and she reports that the patient is not taking spironolactone. She is taking benazepril.   Also offered the Prolia injections and advised that this would be covered by her insurance, and daughter reports that the patient declined. She does not want to take anything for her bones.

## 2018-02-14 NOTE — Addendum Note (Signed)
Addended by: Mila MerryFISHER, DONALD E on: 02/14/2018 11:01 AM   Modules accepted: Orders

## 2018-02-16 DIAGNOSIS — S52502D Unspecified fracture of the lower end of left radius, subsequent encounter for closed fracture with routine healing: Secondary | ICD-10-CM | POA: Diagnosis not present

## 2018-02-16 DIAGNOSIS — N181 Chronic kidney disease, stage 1: Secondary | ICD-10-CM | POA: Diagnosis not present

## 2018-02-16 DIAGNOSIS — Z7901 Long term (current) use of anticoagulants: Secondary | ICD-10-CM | POA: Diagnosis not present

## 2018-02-16 DIAGNOSIS — Z9181 History of falling: Secondary | ICD-10-CM | POA: Diagnosis not present

## 2018-02-16 DIAGNOSIS — I129 Hypertensive chronic kidney disease with stage 1 through stage 4 chronic kidney disease, or unspecified chronic kidney disease: Secondary | ICD-10-CM | POA: Diagnosis not present

## 2018-02-16 DIAGNOSIS — H3530 Unspecified macular degeneration: Secondary | ICD-10-CM | POA: Diagnosis not present

## 2018-02-16 DIAGNOSIS — I482 Chronic atrial fibrillation: Secondary | ICD-10-CM | POA: Diagnosis not present

## 2018-02-16 DIAGNOSIS — S72002D Fracture of unspecified part of neck of left femur, subsequent encounter for closed fracture with routine healing: Secondary | ICD-10-CM | POA: Diagnosis not present

## 2018-02-16 DIAGNOSIS — M1990 Unspecified osteoarthritis, unspecified site: Secondary | ICD-10-CM | POA: Diagnosis not present

## 2018-02-16 DIAGNOSIS — S62102D Fracture of unspecified carpal bone, left wrist, subsequent encounter for fracture with routine healing: Secondary | ICD-10-CM | POA: Diagnosis not present

## 2018-02-16 DIAGNOSIS — E785 Hyperlipidemia, unspecified: Secondary | ICD-10-CM | POA: Diagnosis not present

## 2018-02-16 DIAGNOSIS — M810 Age-related osteoporosis without current pathological fracture: Secondary | ICD-10-CM | POA: Diagnosis not present

## 2018-02-16 DIAGNOSIS — E559 Vitamin D deficiency, unspecified: Secondary | ICD-10-CM | POA: Diagnosis not present

## 2018-02-18 DIAGNOSIS — Z9181 History of falling: Secondary | ICD-10-CM | POA: Diagnosis not present

## 2018-02-18 DIAGNOSIS — N181 Chronic kidney disease, stage 1: Secondary | ICD-10-CM | POA: Diagnosis not present

## 2018-02-18 DIAGNOSIS — H3530 Unspecified macular degeneration: Secondary | ICD-10-CM | POA: Diagnosis not present

## 2018-02-18 DIAGNOSIS — S72002D Fracture of unspecified part of neck of left femur, subsequent encounter for closed fracture with routine healing: Secondary | ICD-10-CM | POA: Diagnosis not present

## 2018-02-18 DIAGNOSIS — I129 Hypertensive chronic kidney disease with stage 1 through stage 4 chronic kidney disease, or unspecified chronic kidney disease: Secondary | ICD-10-CM | POA: Diagnosis not present

## 2018-02-18 DIAGNOSIS — M810 Age-related osteoporosis without current pathological fracture: Secondary | ICD-10-CM | POA: Diagnosis not present

## 2018-02-18 DIAGNOSIS — S52502D Unspecified fracture of the lower end of left radius, subsequent encounter for closed fracture with routine healing: Secondary | ICD-10-CM | POA: Diagnosis not present

## 2018-02-18 DIAGNOSIS — I482 Chronic atrial fibrillation: Secondary | ICD-10-CM | POA: Diagnosis not present

## 2018-02-18 DIAGNOSIS — Z7901 Long term (current) use of anticoagulants: Secondary | ICD-10-CM | POA: Diagnosis not present

## 2018-02-18 DIAGNOSIS — M1990 Unspecified osteoarthritis, unspecified site: Secondary | ICD-10-CM | POA: Diagnosis not present

## 2018-02-18 DIAGNOSIS — E559 Vitamin D deficiency, unspecified: Secondary | ICD-10-CM | POA: Diagnosis not present

## 2018-02-18 DIAGNOSIS — E785 Hyperlipidemia, unspecified: Secondary | ICD-10-CM | POA: Diagnosis not present

## 2018-02-18 DIAGNOSIS — S62102D Fracture of unspecified carpal bone, left wrist, subsequent encounter for fracture with routine healing: Secondary | ICD-10-CM | POA: Diagnosis not present

## 2018-02-19 DIAGNOSIS — E785 Hyperlipidemia, unspecified: Secondary | ICD-10-CM | POA: Diagnosis not present

## 2018-02-19 DIAGNOSIS — H3530 Unspecified macular degeneration: Secondary | ICD-10-CM | POA: Diagnosis not present

## 2018-02-19 DIAGNOSIS — E559 Vitamin D deficiency, unspecified: Secondary | ICD-10-CM | POA: Diagnosis not present

## 2018-02-19 DIAGNOSIS — M810 Age-related osteoporosis without current pathological fracture: Secondary | ICD-10-CM | POA: Diagnosis not present

## 2018-02-19 DIAGNOSIS — M1990 Unspecified osteoarthritis, unspecified site: Secondary | ICD-10-CM | POA: Diagnosis not present

## 2018-02-19 DIAGNOSIS — S72002D Fracture of unspecified part of neck of left femur, subsequent encounter for closed fracture with routine healing: Secondary | ICD-10-CM | POA: Diagnosis not present

## 2018-02-19 DIAGNOSIS — S62102D Fracture of unspecified carpal bone, left wrist, subsequent encounter for fracture with routine healing: Secondary | ICD-10-CM | POA: Diagnosis not present

## 2018-02-19 DIAGNOSIS — I129 Hypertensive chronic kidney disease with stage 1 through stage 4 chronic kidney disease, or unspecified chronic kidney disease: Secondary | ICD-10-CM | POA: Diagnosis not present

## 2018-02-19 DIAGNOSIS — Z7901 Long term (current) use of anticoagulants: Secondary | ICD-10-CM | POA: Diagnosis not present

## 2018-02-19 DIAGNOSIS — N181 Chronic kidney disease, stage 1: Secondary | ICD-10-CM | POA: Diagnosis not present

## 2018-02-19 DIAGNOSIS — Z9181 History of falling: Secondary | ICD-10-CM | POA: Diagnosis not present

## 2018-02-19 DIAGNOSIS — I482 Chronic atrial fibrillation: Secondary | ICD-10-CM | POA: Diagnosis not present

## 2018-02-19 DIAGNOSIS — S52502D Unspecified fracture of the lower end of left radius, subsequent encounter for closed fracture with routine healing: Secondary | ICD-10-CM | POA: Diagnosis not present

## 2018-02-20 DIAGNOSIS — I482 Chronic atrial fibrillation: Secondary | ICD-10-CM | POA: Diagnosis not present

## 2018-02-20 DIAGNOSIS — M1990 Unspecified osteoarthritis, unspecified site: Secondary | ICD-10-CM | POA: Diagnosis not present

## 2018-02-20 DIAGNOSIS — Z7901 Long term (current) use of anticoagulants: Secondary | ICD-10-CM | POA: Diagnosis not present

## 2018-02-20 DIAGNOSIS — S72002D Fracture of unspecified part of neck of left femur, subsequent encounter for closed fracture with routine healing: Secondary | ICD-10-CM | POA: Diagnosis not present

## 2018-02-20 DIAGNOSIS — S52502D Unspecified fracture of the lower end of left radius, subsequent encounter for closed fracture with routine healing: Secondary | ICD-10-CM | POA: Diagnosis not present

## 2018-02-20 DIAGNOSIS — N181 Chronic kidney disease, stage 1: Secondary | ICD-10-CM | POA: Diagnosis not present

## 2018-02-20 DIAGNOSIS — M810 Age-related osteoporosis without current pathological fracture: Secondary | ICD-10-CM | POA: Diagnosis not present

## 2018-02-20 DIAGNOSIS — H3530 Unspecified macular degeneration: Secondary | ICD-10-CM | POA: Diagnosis not present

## 2018-02-20 DIAGNOSIS — E559 Vitamin D deficiency, unspecified: Secondary | ICD-10-CM | POA: Diagnosis not present

## 2018-02-20 DIAGNOSIS — S62102D Fracture of unspecified carpal bone, left wrist, subsequent encounter for fracture with routine healing: Secondary | ICD-10-CM | POA: Diagnosis not present

## 2018-02-20 DIAGNOSIS — E785 Hyperlipidemia, unspecified: Secondary | ICD-10-CM | POA: Diagnosis not present

## 2018-02-20 DIAGNOSIS — Z9181 History of falling: Secondary | ICD-10-CM | POA: Diagnosis not present

## 2018-02-20 DIAGNOSIS — I129 Hypertensive chronic kidney disease with stage 1 through stage 4 chronic kidney disease, or unspecified chronic kidney disease: Secondary | ICD-10-CM | POA: Diagnosis not present

## 2018-02-21 DIAGNOSIS — S52502D Unspecified fracture of the lower end of left radius, subsequent encounter for closed fracture with routine healing: Secondary | ICD-10-CM | POA: Diagnosis not present

## 2018-02-21 DIAGNOSIS — S62102D Fracture of unspecified carpal bone, left wrist, subsequent encounter for fracture with routine healing: Secondary | ICD-10-CM | POA: Diagnosis not present

## 2018-02-21 DIAGNOSIS — I482 Chronic atrial fibrillation: Secondary | ICD-10-CM | POA: Diagnosis not present

## 2018-02-21 DIAGNOSIS — H3530 Unspecified macular degeneration: Secondary | ICD-10-CM | POA: Diagnosis not present

## 2018-02-21 DIAGNOSIS — N181 Chronic kidney disease, stage 1: Secondary | ICD-10-CM | POA: Diagnosis not present

## 2018-02-21 DIAGNOSIS — E559 Vitamin D deficiency, unspecified: Secondary | ICD-10-CM | POA: Diagnosis not present

## 2018-02-21 DIAGNOSIS — E785 Hyperlipidemia, unspecified: Secondary | ICD-10-CM | POA: Diagnosis not present

## 2018-02-21 DIAGNOSIS — M810 Age-related osteoporosis without current pathological fracture: Secondary | ICD-10-CM | POA: Diagnosis not present

## 2018-02-21 DIAGNOSIS — M1990 Unspecified osteoarthritis, unspecified site: Secondary | ICD-10-CM | POA: Diagnosis not present

## 2018-02-21 DIAGNOSIS — Z7901 Long term (current) use of anticoagulants: Secondary | ICD-10-CM | POA: Diagnosis not present

## 2018-02-21 DIAGNOSIS — Z9181 History of falling: Secondary | ICD-10-CM | POA: Diagnosis not present

## 2018-02-21 DIAGNOSIS — I129 Hypertensive chronic kidney disease with stage 1 through stage 4 chronic kidney disease, or unspecified chronic kidney disease: Secondary | ICD-10-CM | POA: Diagnosis not present

## 2018-02-21 DIAGNOSIS — S72002D Fracture of unspecified part of neck of left femur, subsequent encounter for closed fracture with routine healing: Secondary | ICD-10-CM | POA: Diagnosis not present

## 2018-02-22 ENCOUNTER — Telehealth: Payer: Self-pay | Admitting: Family Medicine

## 2018-02-22 NOTE — Telephone Encounter (Signed)
I spoke with the patient's daughter, Okey DupreRose, about scheduling AWV-S (Last AWV 01/12/14). She said it's hard to get her in and out of car right now due to recent fracture.  She is wondering if it can be done w/ 05/10/18 appt.  She also asked about samples of Xarelto.  I told her I would find out and call back. VDM (DD)

## 2018-02-25 DIAGNOSIS — N181 Chronic kidney disease, stage 1: Secondary | ICD-10-CM | POA: Diagnosis not present

## 2018-02-25 DIAGNOSIS — S62102D Fracture of unspecified carpal bone, left wrist, subsequent encounter for fracture with routine healing: Secondary | ICD-10-CM | POA: Diagnosis not present

## 2018-02-25 DIAGNOSIS — S52502D Unspecified fracture of the lower end of left radius, subsequent encounter for closed fracture with routine healing: Secondary | ICD-10-CM | POA: Diagnosis not present

## 2018-02-25 DIAGNOSIS — I129 Hypertensive chronic kidney disease with stage 1 through stage 4 chronic kidney disease, or unspecified chronic kidney disease: Secondary | ICD-10-CM | POA: Diagnosis not present

## 2018-02-25 NOTE — Telephone Encounter (Signed)
Spoke with daughter and she wanted both apts to be back to back. Advised her I was out of the office on 05/10/18. Reschedule AWV and CPE to 05/07/18 @ 220 and 3 PM. Left pt a 1 month supply of Xarelto 15 mg up front for p/u. -MM

## 2018-03-04 DIAGNOSIS — M810 Age-related osteoporosis without current pathological fracture: Secondary | ICD-10-CM | POA: Diagnosis not present

## 2018-03-04 DIAGNOSIS — Z7901 Long term (current) use of anticoagulants: Secondary | ICD-10-CM | POA: Diagnosis not present

## 2018-03-04 DIAGNOSIS — H3530 Unspecified macular degeneration: Secondary | ICD-10-CM | POA: Diagnosis not present

## 2018-03-04 DIAGNOSIS — E785 Hyperlipidemia, unspecified: Secondary | ICD-10-CM | POA: Diagnosis not present

## 2018-03-04 DIAGNOSIS — N181 Chronic kidney disease, stage 1: Secondary | ICD-10-CM | POA: Diagnosis not present

## 2018-03-04 DIAGNOSIS — S62102D Fracture of unspecified carpal bone, left wrist, subsequent encounter for fracture with routine healing: Secondary | ICD-10-CM | POA: Diagnosis not present

## 2018-03-04 DIAGNOSIS — I129 Hypertensive chronic kidney disease with stage 1 through stage 4 chronic kidney disease, or unspecified chronic kidney disease: Secondary | ICD-10-CM | POA: Diagnosis not present

## 2018-03-04 DIAGNOSIS — S52502D Unspecified fracture of the lower end of left radius, subsequent encounter for closed fracture with routine healing: Secondary | ICD-10-CM | POA: Diagnosis not present

## 2018-03-04 DIAGNOSIS — I482 Chronic atrial fibrillation: Secondary | ICD-10-CM | POA: Diagnosis not present

## 2018-03-04 DIAGNOSIS — M1990 Unspecified osteoarthritis, unspecified site: Secondary | ICD-10-CM | POA: Diagnosis not present

## 2018-03-04 DIAGNOSIS — Z9181 History of falling: Secondary | ICD-10-CM | POA: Diagnosis not present

## 2018-03-04 DIAGNOSIS — E559 Vitamin D deficiency, unspecified: Secondary | ICD-10-CM | POA: Diagnosis not present

## 2018-03-04 DIAGNOSIS — S72002D Fracture of unspecified part of neck of left femur, subsequent encounter for closed fracture with routine healing: Secondary | ICD-10-CM | POA: Diagnosis not present

## 2018-03-06 ENCOUNTER — Ambulatory Visit: Payer: Self-pay | Admitting: Family Medicine

## 2018-03-25 ENCOUNTER — Telehealth: Payer: Self-pay | Admitting: Family Medicine

## 2018-03-25 NOTE — Telephone Encounter (Signed)
Pt's daughter Okey Dupre is requesting samples of Rivaroxaban (XARELTO) 15 MG TABS tablet. [name] stated that pt is in the doughnut hole and can not afford the 96$ out of pocket cost for a 30 day supply of the medication. Please advise. Thanks TNP

## 2018-03-25 NOTE — Telephone Encounter (Signed)
That's fine, she can have 5 samples bottle Xarelto 15.

## 2018-03-25 NOTE — Telephone Encounter (Signed)
Okay to give?  Thanks,   -Vernona Rieger

## 2018-03-26 NOTE — Telephone Encounter (Signed)
LVMTRC.,PC 

## 2018-03-26 NOTE — Telephone Encounter (Signed)
Samples Placed up front for patient to pick up.

## 2018-03-29 ENCOUNTER — Other Ambulatory Visit: Payer: Self-pay | Admitting: Family Medicine

## 2018-04-29 ENCOUNTER — Telehealth: Payer: Self-pay

## 2018-04-29 NOTE — Telephone Encounter (Signed)
Patient's daughter Okey DupreRose called requesting samples of Xarelto 15mg . Please advise

## 2018-04-29 NOTE — Telephone Encounter (Signed)
No sample of Xarelto 15 mg available. Patient's daughter Okey DupreRose available.

## 2018-05-07 ENCOUNTER — Ambulatory Visit (INDEPENDENT_AMBULATORY_CARE_PROVIDER_SITE_OTHER): Payer: Medicare Other | Admitting: Family Medicine

## 2018-05-07 ENCOUNTER — Ambulatory Visit (INDEPENDENT_AMBULATORY_CARE_PROVIDER_SITE_OTHER): Payer: Medicare Other

## 2018-05-07 VITALS — BP 134/58 | HR 82 | Temp 98.0°F | Ht 62.0 in | Wt 121.0 lb

## 2018-05-07 VITALS — BP 134/58 | HR 82 | Temp 98.0°F | Ht 62.0 in | Wt 121.2 lb

## 2018-05-07 DIAGNOSIS — M810 Age-related osteoporosis without current pathological fracture: Secondary | ICD-10-CM | POA: Diagnosis not present

## 2018-05-07 DIAGNOSIS — Z23 Encounter for immunization: Secondary | ICD-10-CM | POA: Diagnosis not present

## 2018-05-07 DIAGNOSIS — I4891 Unspecified atrial fibrillation: Secondary | ICD-10-CM

## 2018-05-07 DIAGNOSIS — Z0000 Encounter for general adult medical examination without abnormal findings: Secondary | ICD-10-CM | POA: Diagnosis not present

## 2018-05-07 NOTE — Progress Notes (Signed)
Subjective:   Tami [name] is a 84 y.o. female who presents for Medicare Annual (Subsequent) preventive examination.  Review of Systems:  N/A  Cardiac Risk Factors include: advanced age (>22men, >74 women);dyslipidemia;hypertension     Objective:     Vitals: BP (!) 134/58 (BP Location: Right Arm)   Pulse 82   Temp 98 F (36.7 C) (Oral)   Ht 5\' 2"  (1.575 m)   Wt 121 lb 3.2 oz (55 kg)   BMI 22.17 kg/m   Body mass index is 22.17 kg/m.  Advanced Directives 05/07/2018 12/27/2017 12/26/2017 12/26/2017  Does Patient Have a Medical Advance Directive? No No No No  Would patient like information on creating a medical advance directive? Yes (MAU/Ambulatory/Procedural Areas - Information given) - No - Patient declined No - Patient declined    Tobacco Social History   Tobacco Use  Smoking Status Never Smoker  Smokeless Tobacco Never Used     Counseling given: Not Answered   Clinical Intake:  Pre-visit preparation completed: Yes  Pain : No/denies pain Pain Score: 0-No pain     Nutritional Status: BMI of 19-24  Normal Nutritional Risks: None Diabetes: No  How often do you need to have someone help you when you read instructions, pamphlets, or other written materials from your doctor or pharmacy?: 5 - Always(Is legally blind.)  Interpreter Needed?: No  Information entered by :: [facility], LPN  Past Medical History:  Diagnosis Date  . Allergy   . Chicken pox   . Chronic kidney disease   . Hyperlipidemia   . Hypertension   . Measles   . Mumps   . Osteoarthritis   . Osteoporosis   . Periorbital cellulitis of left eye 01/04/2015  . Vitamin D deficiency    Past Surgical History:  Procedure Laterality Date  . ABDOMINAL HYSTERECTOMY    . APPENDECTOMY    . Excision of inferior parathyroid gland Left 05/25/2004   Dr. Lemar Livings  . Fracture of Fibula/Tibula Left   . INTRAMEDULLARY (IM) NAIL INTERTROCHANTERIC Left 12/27/2017   Procedure: INTRAMEDULLARY (IM) NAIL  INTERTROCHANTRIC- AFFIXUS;  Surgeon: Kennedy Bucker, MD;  Location: [facility] ORS;  Service: Orthopedics;  Laterality: Left;  . ORIF WRIST FRACTURE Left 12/27/2017   Procedure: OPEN REDUCTION INTERNAL FIXATION (ORIF) WRIST FRACTURE;  Surgeon: Kennedy Bucker, MD;  Location: [facility] ORS;  Service: Orthopedics;  Laterality: Left;  . OVARIAN CYST REMOVAL     Family History  Problem Relation Age of Onset  . Heart disease Mother   . Hypertension Mother   . Hypertension Father   . Lung cancer Daughter    Social History   Socioeconomic History  . Marital status: Widowed    Spouse name: Not on file  . Number of children: 2  . Years of education: Not on file  . Highest education level: 8th grade  Occupational History  . Occupation: retired  Engineer, production  . Financial resource strain: Not very hard  . Food insecurity:    Worry: Never true    Inability: Never true  . Transportation needs:    Medical: No    Non-medical: No  Tobacco Use  . Smoking status: Never Smoker  . Smokeless tobacco: Never Used  Substance and Sexual Activity  . Alcohol use: No    Alcohol/week: 0.0 standard drinks  . Drug use: No  . Sexual activity: Not on file  Lifestyle  . Physical activity:    Days per week: 0 days    Minutes per session: 0  min  . Stress: Not at all  Relationships  . Social connections:    Talks on phone: Patient refused    Gets together: Patient refused    Attends religious service: Patient refused    Active member of club or organization: Patient refused    Attends meetings of clubs or organizations: Patient refused    Relationship status: Patient refused  Other Topics Concern  . Not on file  Social History Narrative  . Not on file    Outpatient Encounter Medications as of 05/07/2018  Medication Sig  . acetaminophen (TYLENOL) 325 MG tablet Take 1-2 tablets (325-650 mg total) by mouth every 6 (six) hours as needed for mild pain (pain score 1-3 or temp > 100.5).  Marland Kitchen. benazepril (LOTENSIN) 10 MG  tablet TAKE 1 TABLET BY MOUTH EVERY DAY  . Cholecalciferol (VITAMIN D3) 2000 UNITS CHEW Chew 1 tablet by mouth daily.  Marland Kitchen. docusate sodium (COLACE) 100 MG capsule Take 1 capsule (100 mg total) by mouth 2 (two) times daily. (Patient taking differently: Take 100 mg by mouth daily as needed. )  . loratadine (CLARITIN) 10 MG tablet Take by mouth daily.   . Rivaroxaban (XARELTO) 15 MG TABS tablet Take 1 tablet (15 mg total) by mouth daily with supper.  . verapamil (CALAN-SR) 240 MG CR tablet TAKE ONE TABLET BY MOUTH DAILY  . polyethylene glycol (MIRALAX / GLYCOLAX) packet Take 17 g by mouth daily as needed for mild constipation. (Patient not taking: Reported on 05/07/2018)   No facility-administered encounter medications on file as of 05/07/2018.     Activities of Daily Living In your present state of health, do you have any difficulty performing the following activities: 05/07/2018 12/26/2017  Hearing? Y Y  Comment Wears a left ear hearing aid.  -  Vision? 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  -  Difficulty concentrating or making decisions? Malvin JohnsY Y  Walking or climbing stairs? Y Y  Comment Due to loss of vision.  -  Dressing or bathing? N N  Doing errands, shopping? Y Y  Comment Does not drive. -  Preparing Food and eating ? Y -  Comment Daughter does cooking.  -  Using the Toilet? N -  In the past six months, have you accidently leaked urine? N -  Do you have problems with loss of bowel control? N -  Managing your Medications? Y -  Comment Daughter manages medications.  -  Managing your Finances? Y -  Comment Daughter manages finances.  -  Housekeeping or managing your Housekeeping? Y -  Comment Daughter does the cleaning.  -  Some recent data might be hidden    Patient Care Team: Malva LimesFisher, Donald E, MD as PCP - General (Family Medicine)    Assessment:   This is a routine wellness examination for Tami [name].  Exercise Activities and Dietary recommendations Current Exercise  Habits: Home exercise routine, Type of exercise: stretching, Time (Minutes): 10, Frequency (Times/Week): 7, Weekly Exercise (Minutes/Week): 70, Intensity: Mild  Goals    . DIET - INCREASE WATER INTAKE     Recommend to drink at least 6-8 8oz glasses of water per day.       Fall Risk Fall Risk  05/07/2018 09/04/2017 08/16/2016 03/24/2015  Falls in the past year? 1 No Yes No  Number falls in past yr: 0 - 1 -  Injury with Fall? 1 - No -  Comment broken hip; f/u with Dr Rosita KeaMenz - - -  Risk for fall due to : Impaired vision - - -  Follow up Falls prevention discussed - Falls prevention discussed -   FALL RISK PREVENTION PERTAINING TO THE HOME:  Any stairs in or around the home WITH handrails? No  Home free of loose throw rugs in walkways, pet beds, electrical cords, etc? Yes  Adequate lighting in your home to reduce risk of falls? Yes   ASSISTIVE DEVICES UTILIZED TO PREVENT FALLS:  Life alert? No  Use of a cane, walker or w/c? Yes  Grab bars in the bathroom? No  Shower chair or bench in shower? No  Elevated toilet seat or a handicapped toilet? No    TIMED UP AND GO:  Was the test performed? No .    Depression Screen PHQ 2/9 Scores 05/07/2018 09/04/2017 08/16/2016 03/24/2015  PHQ - 2 Score 0 3 1 0  PHQ- 9 Score - 6 2 6      Cognitive Function     6CIT Screen 05/07/2018  What Year? 0 points  What month? 0 points  What time? 0 points  Count back from 20 0 points  Months in reverse 0 points  Repeat phrase 6 points  Total Score 6    Immunization History  Administered Date(s) Administered  . Influenza, High Dose Seasonal PF 02/10/2015, 02/21/2016, 05/07/2018  . Influenza,inj,Quad PF,6+ Mos 02/14/2017  . Pneumococcal Conjugate-13 01/12/2014  . Pneumococcal Polysaccharide-23 06/26/1994  . Td 06/26/1994    Qualifies for Shingles Vaccine? Yes . Due for Shingrix. Education has been provided regarding the importance of this vaccine. Pt has been advised to call insurance company to  determine out of pocket expense. Advised may also receive vaccine at local pharmacy or Health Dept. Verbalized acceptance and understanding.  Tdap: Although this vaccine is not a covered service during a Wellness Exam, does the patient still wish to receive this vaccine today?  No .  Education has been provided regarding the importance of this vaccine. Advised may receive this vaccine at local pharmacy or Health Dept. Aware to provide a copy of the vaccination record if obtained from local pharmacy or Health Dept. Verbalized acceptance and understanding.  Flu Vaccine: Due for Flu vaccine. Does the patient want to receive this vaccine today?  Yes .   Pneumococcal Vaccine: Up to date   Screening Tests Health Maintenance  Topic Date Due  . TETANUS/TDAP  09/05/2018 (Originally 06/26/2004)  . DEXA SCAN  02/18/2019  . INFLUENZA VACCINE  Completed  . PNA vac Low Risk Adult  Completed    Cancer Screenings:  Colorectal Screening: No longer required.   Mammogram: No longer required.    Bone Density: Completed 02/17/14. Results reflect OSTEOPENIA. Repeat every 5 years.   Lung Cancer Screening: (Low Dose CT Chest recommended if Age 44-80 years, 30 pack-year currently smoking OR have quit w/in 15years.) does not qualify.    Additional Screening:  Vision Screening: Recommended annual ophthalmology exams for early detection of glaucoma and other disorders of the eye.  Dental Screening: Recommended annual dental exams for proper oral hygiene  Community Resource Referral:  CRR required this visit?  No       Plan:  I have personally reviewed and addressed the Medicare Annual Wellness questionnaire and have noted the following in the patient's chart:  A. Medical and social history B. Use of alcohol, tobacco or illicit drugs  C. Current medications and supplements D. Functional ability and status E.  Nutritional status F.  Physical activity G. Advance directives H. List  of other  physicians I.  Hospitalizations, surgeries, and ER visits in previous 12 months J.  Vitals K. Screenings such as hearing and vision if needed, cognitive and depression L. Referrals and appointments - none  In addition, I have reviewed and discussed with patient certain preventive protocols, quality metrics, and best practice recommendations. A written personalized care plan for preventive services as well as general preventive health recommendations were provided to patient.  See attached scanned questionnaire for additional information.   Signed,  Hyacinth Meeker, LPN Nurse Health Advisor   Nurse Recommendations: Pt declined the tetanus vaccine today.

## 2018-05-07 NOTE — Progress Notes (Signed)
Patient: Tami [name], Female    DOB: [birth year], 84 y.o.   MRN: [id number] Visit Date: 05/07/2018  Today's Provider: Mila Merryonald Fisher, MD   Chief Complaint  Patient presents with  . Annual Exam   Subjective:     Complete Physical Tami [name] is a 84 y.o. female. She feels fairly well. She reports exercising walking. She reports she is sleeping well. She has no complaints today, but is very hard of hearing and report poor eyesite. She is followed by Dr. Larence PenningNice.   -----------------------------------------------------------   Review of Systems  Constitutional: Negative.   HENT: Positive for hearing loss and postnasal drip. Negative for congestion, dental problem, drooling, ear discharge, ear pain, facial swelling, mouth sores, nosebleeds, rhinorrhea, sinus pressure, sinus pain, sneezing, sore throat, tinnitus, trouble swallowing and voice change.   Eyes: Negative.   Respiratory: Negative.   Cardiovascular: Negative.   Gastrointestinal: Positive for constipation. Negative for abdominal distention, abdominal pain, anal bleeding, blood in stool, diarrhea, nausea, rectal pain and vomiting.  Endocrine: Negative.   Genitourinary: Negative.   Musculoskeletal: Negative.   Skin: Negative.   Allergic/Immunologic: Negative.   Neurological: Negative.   Hematological: Negative.   Psychiatric/Behavioral: Positive for agitation, confusion and dysphoric mood. Negative for behavioral problems, decreased concentration, hallucinations, self-injury, sleep disturbance and suicidal ideas. The patient is nervous/anxious. The patient is not hyperactive.     Social History   Socioeconomic History  . Marital status: Widowed    Spouse name: Not on file  . Number of children: 2  . Years of education: Not on file  . Highest education level: 8th grade  Occupational History  . Occupation: retired  Engineer, productionocial Needs  . Financial resource strain: Not very hard  . Food insecurity:    Worry: Never  true    Inability: Never true  . Transportation needs:    Medical: No    Non-medical: No  Tobacco Use  . Smoking status: Never Smoker  . Smokeless tobacco: Never Used  Substance and Sexual Activity  . Alcohol use: No    Alcohol/week: 0.0 standard drinks  . Drug use: No  . Sexual activity: Not on file  Lifestyle  . Physical activity:    Days per week: 0 days    Minutes per session: 0 min  . Stress: Not at all  Relationships  . Social connections:    Talks on phone: Patient refused    Gets together: Patient refused    Attends religious service: Patient refused    Active member of club or organization: Patient refused    Attends meetings of clubs or organizations: Patient refused    Relationship status: Patient refused  . Intimate partner violence:    Fear of current or ex partner: Patient refused    Emotionally abused: Patient refused    Physically abused: Patient refused    Forced sexual activity: Patient refused  Other Topics Concern  . Not on file  Social History Narrative  . Not on file    Past Medical History:  Diagnosis Date  . Allergy   . Chicken pox   . Chronic kidney disease   . Hyperlipidemia   . Hypertension   . Measles   . Mumps   . Osteoarthritis   . Osteoporosis   . Periorbital cellulitis of left eye 01/04/2015  . Vitamin D deficiency      Patient Active Problem List   Diagnosis Date Noted  . Closed left hip fracture (HCC) 12/26/2017  .  Macrocytic anemia 01/06/2015  . Atrial fibrillation (HCC) 01/06/2015  . Allergic rhinitis 01/04/2015  . Chronic kidney disease, stage 3 (HCC) 01/04/2015  . Cyst of eye 01/04/2015  . Hyperlipidemia 01/04/2015  . Liver cyst 01/04/2015  . Hypertension 01/04/2015  . Osteoarthritis 01/04/2015  . Osteoporosis 02/17/2014  . Vitamin D deficiency 02/01/2011    Past Surgical History:  Procedure Laterality Date  . ABDOMINAL HYSTERECTOMY    . APPENDECTOMY    . Excision of inferior parathyroid gland Left 05/25/2004     Dr. Lemar Livings  . Fracture of Fibula/Tibula Left   . INTRAMEDULLARY (IM) NAIL INTERTROCHANTERIC Left 12/27/2017   Procedure: INTRAMEDULLARY (IM) NAIL INTERTROCHANTRIC- AFFIXUS;  Surgeon: Kennedy Bucker, MD;  Location: [facility] ORS;  Service: Orthopedics;  Laterality: Left;  . ORIF WRIST FRACTURE Left 12/27/2017   Procedure: OPEN REDUCTION INTERNAL FIXATION (ORIF) WRIST FRACTURE;  Surgeon: Kennedy Bucker, MD;  Location: [facility] ORS;  Service: Orthopedics;  Laterality: Left;  . OVARIAN CYST REMOVAL      Her family history includes Heart disease in her mother; Hypertension in her father and mother; Lung cancer in her daughter.      Current Outpatient Medications:  .  acetaminophen (TYLENOL) 325 MG tablet, Take 1-2 tablets (325-650 mg total) by mouth every 6 (six) hours as needed for mild pain (pain score 1-3 or temp > 100.5)., Disp: , Rfl:  .  benazepril (LOTENSIN) 10 MG tablet, TAKE 1 TABLET BY MOUTH EVERY DAY, Disp: 30 tablet, Rfl: 5 .  Cholecalciferol (VITAMIN D3) 2000 UNITS CHEW, Chew 1 tablet by mouth daily., Disp: 1 tablet, Rfl: 2 .  docusate sodium (COLACE) 100 MG capsule, Take 1 capsule (100 mg total) by mouth 2 (two) times daily. (Patient taking differently: Take 100 mg by mouth daily as needed. ), Disp: 10 capsule, Rfl: 0 .  loratadine (CLARITIN) 10 MG tablet, Take by mouth daily. , Disp: , Rfl:  .  polyethylene glycol (MIRALAX / GLYCOLAX) packet, Take 17 g by mouth daily as needed for mild constipation., Disp: 14 each, Rfl: 0 .  Rivaroxaban (XARELTO) 15 MG TABS tablet, Take 1 tablet (15 mg total) by mouth daily with supper., Disp: 30 tablet, Rfl: 12 .  verapamil (CALAN-SR) 240 MG CR tablet, TAKE ONE TABLET BY MOUTH DAILY, Disp: 30 tablet, Rfl: 11  Patient Care Team: Malva Limes, MD as PCP - General (Family Medicine)     Objective:   Vitals: BP (!) 134/58   Pulse 82   Temp 98 F (36.7 C) (Oral)   Ht 5\' 2"  (1.575 m)   Wt 121 lb (54.9 kg)   BMI 22.13 kg/m   Physical  Exam   General Appearance:    Alert, cooperative, no distress, appears stated age  Head:    Normocephalic, without obvious abnormality, atraumatic  Eyes:    PERRL, conjunctiva/corneas clear, EOM's intact, fundi    benign, both eyes       Ears:    Normal TM's and external ear canals, both ears, Very hard of hearing  Nose:   Nares normal, septum midline, mucosa normal, no drainage   or sinus tenderness  Throat:   Lips, mucosa, and tongue normal; teeth and gums normal  Neck:   Supple, symmetrical, trachea midline, no adenopathy;       thyroid:  No enlargement/tenderness/nodules; no carotid   bruit or JVD  Back:     Symmetric, no curvature, ROM normal, no CVA tenderness  Lungs:     Clear to auscultation bilaterally, respirations  unlabored  Chest wall:    No tenderness or deformity  Heart:     Irregularly irregular rhythm. Normal rate  S1 and S2 normal, no murmur, rub   or gallop  Abdomen:     Soft, non-tender, bowel sounds active all four quadrants,    no masses, no organomegaly  Genitalia:    deferred  Rectal:    deferred  Extremities:   Extremities normal, atraumatic, no cyanosis or edema  Pulses:   2+ and symmetric all extremities  Skin:   Skin color, texture, turgor normal, no rashes or lesions  Lymph nodes:   Cervical, supraclavicular, and axillary nodes normal  Neurologic:   CNII-XII intact. Normal strength, sensation and reflexes      throughout    Activities of Daily Living In your present state of health, do you have any difficulty performing the following activities: 05/07/2018 12/26/2017  Hearing? Y Y  Comment Wears a left ear hearing aid.  -  Vision? 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  -  Difficulty concentrating or making decisions? Malvin Johns  Walking or climbing stairs? Y Y  Comment Due to loss of vision.  -  Dressing or bathing? N N  Doing errands, shopping? Y Y  Comment Does not drive. -  Preparing Food and eating ? Y -  Comment Daughter does cooking.   -  Using the Toilet? N -  In the past six months, have you accidently leaked urine? N -  Do you have problems with loss of bowel control? N -  Managing your Medications? Y -  Comment Daughter manages medications.  -  Managing your Finances? Y -  Comment Daughter manages finances.  -  Housekeeping or managing your Housekeeping? Y -  Comment Daughter does the cleaning.  -  Some recent data might be hidden    Fall Risk Assessment Fall Risk  05/07/2018 09/04/2017 08/16/2016 03/24/2015  Falls in the past year? 1 No Yes No  Number falls in past yr: 0 - 1 -  Injury with Fall? 1 - No -  Comment broken hip; f/u with Dr Rosita Kea - - -  Risk for fall due to : Impaired vision - - -  Follow up Falls prevention discussed - Falls prevention discussed -     Depression Screen PHQ 2/9 Scores 05/07/2018 09/04/2017 08/16/2016 03/24/2015  PHQ - 2 Score 0 3 1 0  PHQ- 9 Score - 6 2 6     6CIT Screen 05/07/2018  What Year? 0 points  What month? 0 points  What time? 0 points  Count back from 20 0 points  Months in reverse 0 points  Repeat phrase 6 points  Total Score 6        Assessment & Plan:    Annual Physical Reviewed patient's Family Medical History Reviewed and updated list of patient's medical providers Assessment of cognitive impairment was done Assessed patient's functional ability Established a written schedule for health screening services Health Risk Assessent Completed and Reviewed  Exercise Activities and Dietary recommendations Goals    . DIET - INCREASE WATER INTAKE     Recommend to drink at least 6-8 8oz glasses of water per day.       Immunization History  Administered Date(s) Administered  . Influenza, High Dose Seasonal PF 02/10/2015, 02/21/2016, 05/07/2018  . Influenza,inj,Quad PF,6+ Mos 02/14/2017  . Pneumococcal Conjugate-13 01/12/2014  . Pneumococcal Polysaccharide-23 06/26/1994  . Td 06/26/1994    Health Maintenance  Topic Date Due  . TETANUS/TDAP  09/05/2018  (Originally 06/26/2004)  . DEXA SCAN  02/18/2019  . INFLUENZA VACCINE  Completed  . PNA vac Low Risk Adult  Completed     Discussed health benefits of physical activity, and encouraged her to engage in regular exercise appropriate for her age and condition.    ------------------------------------------------------------------------------------------------------------  1. Annual physical exam Stable.   2. Atrial fibrillation, unspecified type (HCC) Rate well controlled. Doing well with Xarelto.   3. . Osteoporosis, unspecified osteoporosis type, unspecified pathological fracture presence Did not tolerate alendronate due to GI side effects and refuses referral to discuss Prolia or Reclast. Counseled regarding high risk for additional osteoporotic fractures, but she declined referral.    Mila Merry, MD  [facility]

## 2018-05-07 NOTE — Patient Instructions (Signed)
You can take 1 1/2 tablets of 10mg  Xarelto samples until you get refills for 15mg  tablets.

## 2018-05-07 NOTE — Patient Instructions (Addendum)
Tami [name] , Thank you for taking time to come for your Medicare Wellness Visit. I appreciate your ongoing commitment to your health goals. Please review the following plan we discussed and let me know if I can assist you in the future.   Screening recommendations/referrals: Colonoscopy: No longer required.  Mammogram: No longer required.  Bone Density: Up to date, due 02/2019 Recommended yearly ophthalmology/optometry visit for glaucoma screening and checkup Recommended yearly dental visit for hygiene and checkup  Vaccinations: Influenza vaccine: Administered today.  Pneumococcal vaccine: Completed series Tdap vaccine: Pt declines today.   Shingles vaccine: Pt declines today.     Advanced directives: Advance directive discussed with you today. I have provided a copy for you to complete at home and have notarized. Once this is complete please bring a copy in to our office so we can scan it into your chart.  Conditions/risks identified: Fall risk prevention; Recommend to drink at least 6-8 8oz glasses of water per day.  Next appointment: 3:00 PM today with Tami [name].    Preventive Care 2365 Years and Older, Female Preventive care refers to lifestyle choices and visits with your health care provider that can promote health and wellness. What does preventive care include?  A yearly physical exam. This is also called an annual well check.  Dental exams once or twice a year.  Routine eye exams. Ask your health care provider how often you should have your eyes checked.  Personal lifestyle choices, including:  Daily care of your teeth and gums.  Regular physical activity.  Eating a healthy diet.  Avoiding tobacco and drug use.  Limiting alcohol use.  Practicing safe sex.  Taking low-dose aspirin every day.  Taking vitamin and mineral supplements as recommended by your health care provider. What happens during an annual well check? The services and screenings done by your  health care provider during your annual well check will depend on your age, overall health, lifestyle risk factors, and family history of disease. Counseling  Your health care provider may ask you questions about your:  Alcohol use.  Tobacco use.  Drug use.  Emotional well-being.  Home and relationship well-being.  Sexual activity.  Eating habits.  History of falls.  Memory and ability to understand (cognition).  Work and work Astronomerenvironment.  Reproductive health. Screening  You may have the following tests or measurements:  Height, weight, and BMI.  Blood pressure.  Lipid and cholesterol levels. These may be checked every 5 years, or more frequently if you are over 10339 years old.  Skin check.  Lung cancer screening. You may have this screening every year starting at age 82 if you have a 30-pack-year history of smoking and currently smoke or have quit within the past 15 years.  Fecal occult blood test (FOBT) of the stool. You may have this test every year starting at age 450.  Flexible sigmoidoscopy or colonoscopy. You may have a sigmoidoscopy every 5 years or a colonoscopy every 10 years starting at age 82.  Hepatitis C blood test.  Hepatitis B blood test.  Sexually transmitted disease (STD) testing.  Diabetes screening. This is done by checking your blood sugar (glucose) after you have not eaten for a while (fasting). You may have this done every 1-3 years.  Bone density scan. This is done to screen for osteoporosis. You may have this done starting at age 82.  Mammogram. This may be done every 1-2 years. Talk to your health care provider about how often you  should have regular mammograms. Talk with your health care provider about your test results, treatment options, and if necessary, the need for more tests. Vaccines  Your health care provider may recommend certain vaccines, such as:  Influenza vaccine. This is recommended every year.  Tetanus, diphtheria, and  acellular pertussis (Tdap, Td) vaccine. You may need a Td booster every 10 years.  Zoster vaccine. You may need this after age 69.  Pneumococcal 13-valent conjugate (PCV13) vaccine. One dose is recommended after age 32.  Pneumococcal polysaccharide (PPSV23) vaccine. One dose is recommended after age 67. Talk to your health care provider about which screenings and vaccines you need and how often you need them. This information is not intended to replace advice given to you by your health care provider. Make sure you discuss any questions you have with your health care provider. Document Released: 06/18/2015 Document Revised: 02/09/2016 Document Reviewed: 03/23/2015 Elsevier Interactive Patient Education  2017 [employer] Prevention in the Home Falls can cause injuries. They can happen to people of all ages. There are many things you can do to make your home safe and to help prevent falls. What can I do on the outside of my home?  Regularly fix the edges of walkways and driveways and fix any cracks.  Remove anything that might make you trip as you walk through a door, such as a raised step or threshold.  Trim any bushes or trees on the path to your home.  Use bright outdoor lighting.  Clear any walking paths of anything that might make someone trip, such as rocks or tools.  Regularly check to see if handrails are loose or broken. Make sure that both sides of any steps have handrails.  Any raised decks and porches should have guardrails on the edges.  Have any leaves, snow, or ice cleared regularly.  Use sand or salt on walking paths during winter.  Clean up any spills in your garage right away. This includes oil or grease spills. What can I do in the bathroom?  Use night lights.  Install grab bars by the toilet and in the tub and shower. Do not use towel bars as grab bars.  Use non-skid mats or decals in the tub or shower.  If you need to sit down in the shower, use  a plastic, non-slip stool.  Keep the floor dry. Clean up any water that spills on the floor as soon as it happens.  Remove soap buildup in the tub or shower regularly.  Attach bath mats securely with double-sided non-slip rug tape.  Do not have throw rugs and other things on the floor that can make you trip. What can I do in the bedroom?  Use night lights.  Make sure that you have a light by your bed that is easy to reach.  Do not use any sheets or blankets that are too big for your bed. They should not hang down onto the floor.  Have a firm chair that has side arms. You can use this for support while you get dressed.  Do not have throw rugs and other things on the floor that can make you trip. What can I do in the kitchen?  Clean up any spills right away.  Avoid walking on wet floors.  Keep items that you use a lot in easy-to-reach places.  If you need to reach something above you, use a strong step stool that has a grab bar.  Keep electrical cords out  of the way.  Do not use floor polish or wax that makes floors slippery. If you must use wax, use non-skid floor wax.  Do not have throw rugs and other things on the floor that can make you trip. What can I do with my stairs?  Do not leave any items on the stairs.  Make sure that there are handrails on both sides of the stairs and use them. Fix handrails that are broken or loose. Make sure that handrails are as long as the stairways.  Check any carpeting to make sure that it is firmly attached to the stairs. Fix any carpet that is loose or worn.  Avoid having throw rugs at the top or bottom of the stairs. If you do have throw rugs, attach them to the floor with carpet tape.  Make sure that you have a light switch at the top of the stairs and the bottom of the stairs. If you do not have them, ask someone to add them for you. What else can I do to help prevent falls?  Wear shoes that:  Do not have high heels.  Have  rubber bottoms.  Are comfortable and fit you well.  Are closed at the toe. Do not wear sandals.  If you use a stepladder:  Make sure that it is fully opened. Do not climb a closed stepladder.  Make sure that both sides of the stepladder are locked into place.  Ask someone to hold it for you, if possible.  Clearly mark and make sure that you can see:  Any grab bars or handrails.  First and last steps.  Where the edge of each step is.  Use tools that help you move around (mobility aids) if they are needed. These include:  Canes.  Walkers.  Scooters.  Crutches.  Turn on the lights when you go into a dark area. Replace any light bulbs as soon as they burn out.  Set up your furniture so you have a clear path. Avoid moving your furniture around.  If any of your floors are uneven, fix them.  If there are any pets around you, be aware of where they are.  Review your medicines with your doctor. Some medicines can make you feel dizzy. This can increase your chance of falling. Ask your doctor what other things that you can do to help prevent falls. This information is not intended to replace advice given to you by your health care provider. Make sure you discuss any questions you have with your health care provider. Document Released: 03/18/2009 Document Revised: 10/28/2015 Document Reviewed: 06/26/2014 Elsevier Interactive Patient Education  2017 [employer].

## 2018-05-09 ENCOUNTER — Encounter: Payer: Self-pay | Admitting: Family Medicine

## 2018-05-10 ENCOUNTER — Ambulatory Visit: Payer: Self-pay | Admitting: Family Medicine

## 2018-05-20 ENCOUNTER — Encounter: Payer: Self-pay | Admitting: Family Medicine

## 2018-05-20 ENCOUNTER — Ambulatory Visit (INDEPENDENT_AMBULATORY_CARE_PROVIDER_SITE_OTHER): Payer: Medicare Other | Admitting: Family Medicine

## 2018-05-20 VITALS — BP 120/70 | HR 67 | Temp 97.0°F | Resp 16 | Wt 119.2 lb

## 2018-05-20 DIAGNOSIS — B029 Zoster without complications: Secondary | ICD-10-CM

## 2018-05-20 MED ORDER — VALACYCLOVIR HCL 1 G PO TABS
1000.0000 mg | ORAL_TABLET | Freq: Two times a day (BID) | ORAL | 0 refills | Status: DC
Start: 2018-05-20 — End: 2019-12-16

## 2018-05-20 NOTE — Patient Instructions (Signed)
Continue to use Tylenol for pain relief. Let Dr. Sherrie MustacheFisher know if not improving.

## 2018-05-20 NOTE — Progress Notes (Signed)
  Subjective:     Patient ID: Tami [name], female   DOB: 08-14-1923, 84 y.o.   MRN: [id number] Chief Complaint  Patient presents with  . Rash    Patient comes in office today accompanied by her daughter who has concerns of a rash that has been present on patients left arm for the past 3-4 days. Patient complains that rash is spreading up arm and to back and describes it as painful to touch. Patient daughter denies any changes to detergent, lotion , beauty products or food.    HPI Accompanied by her daughter today who states she get pain relief with tylenol.  Review of Systems     Objective:   Physical Exam Constitutional:      Appearance: She is not ill-appearing.     Comments: Hard of hearing with diminished sight.  Skin:    Comments: Patchy vesicular rash from the dorsum of her left arm to midline upper back.  Neurological:     Mental Status: She is alert.        Assessment:    1. Herpes zoster without complication: renal dose Valtrex 1gm twice daily    Plan:    Further f/u with PCP if not improving.Discussed natural course of the illness.

## 2018-05-28 ENCOUNTER — Other Ambulatory Visit: Payer: Self-pay

## 2018-05-28 ENCOUNTER — Emergency Department
Admission: EM | Admit: 2018-05-28 | Discharge: 2018-05-28 | Disposition: A | Discharge status: Home / Self Care | Payer: Medicare Other | Attending: Emergency Medicine | Admitting: Emergency Medicine

## 2018-05-28 DIAGNOSIS — Z7901 Long term (current) use of anticoagulants: Secondary | ICD-10-CM | POA: Insufficient documentation

## 2018-05-28 DIAGNOSIS — N183 Chronic kidney disease, stage 3 (moderate): Secondary | ICD-10-CM | POA: Diagnosis not present

## 2018-05-28 DIAGNOSIS — I129 Hypertensive chronic kidney disease with stage 1 through stage 4 chronic kidney disease, or unspecified chronic kidney disease: Secondary | ICD-10-CM | POA: Insufficient documentation

## 2018-05-28 DIAGNOSIS — Z79899 Other long term (current) drug therapy: Secondary | ICD-10-CM | POA: Insufficient documentation

## 2018-05-28 DIAGNOSIS — H04302 Unspecified dacryocystitis of left lacrimal passage: Secondary | ICD-10-CM | POA: Diagnosis not present

## 2018-05-28 DIAGNOSIS — H579 Unspecified disorder of eye and adnexa: Secondary | ICD-10-CM | POA: Diagnosis present

## 2018-05-28 MED ORDER — AMOXICILLIN-POT CLAVULANATE 875-125 MG PO TABS: 1.0000 | ORAL_TABLET | Freq: Two times a day (BID) | ORAL | 0 refills | Status: AC

## 2018-05-28 MED ORDER — ERYTHROMYCIN 5 MG/GM OP OINT: 1.0000 "application " | TOPICAL_OINTMENT | Freq: Four times a day (QID) | OPHTHALMIC | 0 refills | Status: DC

## 2018-05-28 NOTE — ED Triage Notes (Signed)
Pt has swelling and redness to the left medial eye for the past 2-3 days per family. States she is still being treated for shingles that was on her back. No noted rash of the face.

## 2018-05-28 NOTE — ED Provider Notes (Signed)
[facility] Emergency Department Provider Note    ____________________________________________   I have reviewed the triage vital signs and the nursing notes.   HISTORY  Chief Complaint Swelling around left eye  History limited by: Not Limited   HPI Tami [name] is a 84 y.o. female who presents to the emergency department today because of concern for swelling around her left eye. Family first noticed it a few days ago. Located just medial to her left eye. The patient states that it is tender. There has been some increase in eye drainage since it started. No fevers, nausea or vomiting. Patient denies any trauma to her eye. Denies similar symptoms in the past. Has recently been seen and treated for herpes to her left upper back and arm.   Per medical record review patient has a history of recent diagnosis of shingles.   Past Medical History:  Diagnosis Date  . Allergy   . Chicken pox   . Chronic kidney disease   . Closed left hip fracture (HCC) 12/26/2017  . Hyperlipidemia   . Hypertension   . Measles   . Mumps   . Osteoarthritis   . Osteoporosis   . Periorbital cellulitis of left eye 01/04/2015  . Vitamin D deficiency     Patient Active Problem List   Diagnosis Date Noted  . Macrocytic anemia 01/06/2015  . Atrial fibrillation (HCC) 01/06/2015  . Allergic rhinitis 01/04/2015  . Chronic kidney disease, stage 3 (HCC) 01/04/2015  . Cyst of eye 01/04/2015  . Hyperlipidemia 01/04/2015  . Liver cyst 01/04/2015  . Hypertension 01/04/2015  . Osteoarthritis 01/04/2015  . Osteoporosis 02/17/2014  . Vitamin D deficiency 02/01/2011    Past Surgical History:  Procedure Laterality Date  . ABDOMINAL HYSTERECTOMY    . APPENDECTOMY    . Excision of inferior parathyroid gland Left 05/25/2004   Dr. Lemar LivingsByrnett  . Fracture of Fibula/Tibula Left   . INTRAMEDULLARY (IM) NAIL INTERTROCHANTERIC Left 12/27/2017   Procedure: INTRAMEDULLARY (IM) NAIL  INTERTROCHANTRIC- AFFIXUS;  Surgeon: Kennedy BuckerMenz, Michael, MD;  Location: [facility] ORS;  Service: Orthopedics;  Laterality: Left;  . ORIF WRIST FRACTURE Left 12/27/2017   Procedure: OPEN REDUCTION INTERNAL FIXATION (ORIF) WRIST FRACTURE;  Surgeon: Kennedy BuckerMenz, Michael, MD;  Location: [facility] ORS;  Service: Orthopedics;  Laterality: Left;  . OVARIAN CYST REMOVAL      Prior to Admission medications   Medication Sig Start Date End Date Taking? Authorizing Provider  acetaminophen (TYLENOL) 325 MG tablet Take 1-2 tablets (325-650 mg total) by mouth every 6 (six) hours as needed for mild pain (pain score 1-3 or temp > 100.5). 12/31/17   Ramonita LabGouru, Aruna, MD  benazepril (LOTENSIN) 10 MG tablet TAKE 1 TABLET BY MOUTH EVERY DAY 03/29/18   Malva LimesFisher, Donald E, MD  Cholecalciferol (VITAMIN D3) 2000 UNITS CHEW Chew 1 tablet by mouth daily. 01/20/15   Malva LimesFisher, Donald E, MD  docusate sodium (COLACE) 100 MG capsule Take 1 capsule (100 mg total) by mouth 2 (two) times daily. Patient taking differently: Take 100 mg by mouth daily as needed.  12/31/17   Gouru, Deanna ArtisAruna, MD  loratadine (CLARITIN) 10 MG tablet Take by mouth daily.     [provider]  polyethylene glycol (MIRALAX / GLYCOLAX) packet Take 17 g by mouth daily as needed for mild constipation. 12/31/17   Ramonita LabGouru, Aruna, MD  Rivaroxaban (XARELTO) 15 MG TABS tablet Take 1 tablet (15 mg total) by mouth daily with supper. 12/05/17   Malva LimesFisher, Donald E, MD  valACYclovir Ralph Dowdy(VALTREX)  1000 MG tablet Take 1 tablet (1,000 mg total) by mouth 2 (two) times daily. 05/20/18   Anola Gurneyhauvin, Robert, PA  verapamil (CALAN-SR) 240 MG CR tablet TAKE ONE TABLET BY MOUTH DAILY 10/28/17   Malva LimesFisher, Donald E, MD    Allergies Maxzide  [hydrochlorothiazide w-triamterene] and Sulfa antibiotics  Family History  Problem Relation Age of Onset  . Heart disease Mother   . Hypertension Mother   . Hypertension Father   . Lung cancer Daughter     Social History Social History   Tobacco Use  . Smoking status: Never  Smoker  . Smokeless tobacco: Never Used  Substance Use Topics  . Alcohol use: No    Alcohol/week: 0.0 standard drinks  . Drug use: No    Review of Systems Constitutional: No fever/chills Eyes: positive for swelling to the medial aspect of the left eye ENT: No sore throat. Cardiovascular: Denies chest pain. Respiratory: Denies shortness of breath. Gastrointestinal: No abdominal pain.  No nausea, no vomiting.  No diarrhea.   Genitourinary: Negative for dysuria. Musculoskeletal: Negative for back pain. Skin: positive for rash to left upper back, left arm.  Neurological: Negative for headaches, focal weakness or numbness.  ____________________________________________   PHYSICAL EXAM:  VITAL SIGNS: ED Triage Vitals [05/28/18 1202]  Enc Vitals Group     BP (!) 141/66     Pulse Rate 86     Resp 16     Temp 98.2 F (36.8 C)     Temp Source Oral     SpO2 99 %     Weight 119 lb (54 kg)     Height 5\' 3"  (1.6 m)     Head Circumference      Peak Flow      Pain Score 5   Constitutional: Alert and oriented.  Eyes: Conjunctivae are normal, not injected. Moderate amount of discharge to the left eye. Roughly 1 cm diameter area of swelling and tenderness just medial to the left eye.  ENT      Head: Normocephalic and atraumatic.      Nose: No congestion/rhinnorhea.      Mouth/Throat: Mucous membranes are moist.      Neck: No stridor. Hematological/Lymphatic/Immunilogical: No cervical lymphadenopathy. Cardiovascular: Normal rate, regular rhythm.  No murmurs, rubs, or gallops.  Respiratory: Normal respiratory effort without tachypnea nor retractions. Breath sounds are clear and equal bilaterally. No wheezes/rales/rhonchi. Gastrointestinal: Soft and non tender. No rebound. No guarding.  Genitourinary: Deferred Musculoskeletal: Normal range of motion in all extremities. No lower extremity edema. Neurologic:  Normal speech and language. No gross focal neurologic deficits are appreciated.   Skin:  Rash with scabs consistent with shingles to left upper arm and left upper back. Psychiatric: Mood and affect are normal. Speech and behavior are normal. Patient exhibits appropriate insight and judgment.  ____________________________________________    LABS (pertinent positives/negatives)  None  ____________________________________________   EKG  None  ____________________________________________    RADIOLOGY  None  ____________________________________________   PROCEDURES  Procedures  ____________________________________________   INITIAL IMPRESSION / ASSESSMENT AND PLAN / ED COURSE  Pertinent labs & imaging results that were available during my care of the patient were reviewed by me and considered in my medical decision making (see chart for details).   Patient presented to the emergency department today because of concern for swelling and pain just medial to the left eye. On exam does have area of swelling and tenderness there. No herpetic lesions to the face. No conjunctival injection. Patient had  already been on multiple days of valtrex prior to eye issue.  At this point I do not think the eye swelling is related to recent diagnosis of shingles. Will plan on starting on antibiotics both oral and ocular and plan on discharging to follow up with ophthalmology. Discussed plan with patient and family.   ____________________________________________   FINAL CLINICAL IMPRESSION(S) / ED DIAGNOSES  Final diagnoses:  Infection of left lacrimal duct     Note: This dictation was prepared with Dragon dictation. Any transcriptional errors that result from this process are unintentional     Phineas Semen, MD 05/28/18 [phone]

## 2018-05-28 NOTE — Discharge Instructions (Signed)
Please seek medical attention for any high fevers, chest pain, shortness of breath, change in behavior, persistent vomiting, bloody stool or any other new or concerning symptoms.  

## 2018-05-30 ENCOUNTER — Telehealth: Payer: Self-pay

## 2018-05-30 DIAGNOSIS — H04302 Unspecified dacryocystitis of left lacrimal passage: Secondary | ICD-10-CM | POA: Diagnosis not present

## 2018-05-30 NOTE — Telephone Encounter (Signed)
Is it okay to give if available?  Thanks,   -Constancia Geeting  

## 2018-05-30 NOTE — Telephone Encounter (Signed)
She can have 4 samples bottles of 15mg  xarelto if we have them.

## 2018-05-30 NOTE — Telephone Encounter (Signed)
Patient's daughter called to ask if we had any Lesia HausenXaralto she could have.  Patient is out

## 2018-05-30 NOTE — Telephone Encounter (Signed)
We are out of 15mg .  I gave pt 10mg  and 2.5mg  bottles.  I advised daughter to have Ms. Kopper take 10mg  tablet daily and 2 2.5mg  tablets daily.   Thanks,   -Vernona RiegerLaura

## 2018-06-06 DIAGNOSIS — H04302 Unspecified dacryocystitis of left lacrimal passage: Secondary | ICD-10-CM | POA: Diagnosis not present

## 2018-06-26 ENCOUNTER — Telehealth: Payer: Self-pay | Admitting: Family Medicine

## 2018-06-26 NOTE — Telephone Encounter (Signed)
Is this ok?

## 2018-06-26 NOTE — Telephone Encounter (Signed)
Two bottles have been taken out to give to [facility] for pt.  Per Michelle Nasuti - please sign the two bottles out of Xarelto in the supply of samples.  Thanks, [employer]

## 2018-06-26 NOTE — Telephone Encounter (Signed)
Done.   Thanks,   -Hommer Cunliffe  

## 2018-06-26 NOTE — Telephone Encounter (Signed)
Can have two bottles of Xarelto 15mg  tablets.

## 2018-06-26 NOTE — Telephone Encounter (Signed)
Pt needing some free samples of  Rivaroxaban (XARELTO) 15 MG TABS tablet   Pt is out and will not get her check until Feb 3.  Pt's Daughter has been in the hospital and not able to pick them up herself.  Her son, pt's grandson Trey Paula will come by to pick them up if they can be provided.  Please call [name] back to let her know.  Thanks, [employer]

## 2018-07-08 ENCOUNTER — Other Ambulatory Visit: Payer: Self-pay | Admitting: Family Medicine

## 2018-07-08 NOTE — Telephone Encounter (Signed)
Pt is out. Needing refill today.  Thanks, [employer]

## 2018-10-16 ENCOUNTER — Other Ambulatory Visit: Payer: Self-pay | Admitting: Family Medicine

## 2018-11-05 ENCOUNTER — Ambulatory Visit: Payer: Medicare Other | Admitting: Family Medicine

## 2018-11-15 ENCOUNTER — Other Ambulatory Visit: Payer: Self-pay | Admitting: Family Medicine

## 2018-12-13 ENCOUNTER — Encounter: Payer: Self-pay | Admitting: Family Medicine

## 2018-12-13 ENCOUNTER — Ambulatory Visit (INDEPENDENT_AMBULATORY_CARE_PROVIDER_SITE_OTHER): Payer: Medicare Other | Admitting: Family Medicine

## 2018-12-13 ENCOUNTER — Other Ambulatory Visit: Payer: Self-pay

## 2018-12-13 VITALS — BP 118/75 | HR 75 | Temp 98.8°F | Wt 127.0 lb

## 2018-12-13 DIAGNOSIS — R413 Other amnesia: Secondary | ICD-10-CM | POA: Diagnosis not present

## 2018-12-13 DIAGNOSIS — E785 Hyperlipidemia, unspecified: Secondary | ICD-10-CM

## 2018-12-13 DIAGNOSIS — E559 Vitamin D deficiency, unspecified: Secondary | ICD-10-CM

## 2018-12-13 DIAGNOSIS — I10 Essential (primary) hypertension: Secondary | ICD-10-CM | POA: Diagnosis not present

## 2018-12-13 NOTE — Patient Instructions (Signed)
.   Please review the attached list of medications and notify my office if there are any errors.   . Please bring all of your medications to every appointment so we can make sure that our medication list is the same as yours.   . We will have flu vaccines available after Labor Day. Please go to your pharmacy or call the office in early September to schedule you flu shot.   

## 2018-12-13 NOTE — Progress Notes (Signed)
Patient: Tami [name] Female    DOB: [birth year]   85 y.o.   MRN: [id number] Visit Date: 12/13/2018  Today's Provider: Lelon Huh, MD   Chief Complaint  Patient presents with  . Hypertension   Subjective:     HPI     Hypertension, follow-up:  BP Readings from Last 3 Encounters:  12/13/18 118/75  05/28/18 (!) 157/68  05/20/18 120/70    She was last seen for hypertension 6 months ago.  BP at that visit was 157/68. Management since that visit includes no changes. She reports excellent compliance with treatment. She is not having side effects.  She is exercising. She is adherent to low salt diet.   Outside blood pressures are not being checked. She is experiencing none.  Patient denies chest pain, dyspnea, fatigue and palpitations.   Cardiovascular risk factors include advanced age (older than 39 for men, 44 for women) and hypertension.  Use of agents associated with hypertension: none.     Weight trend: stable Wt Readings from Last 3 Encounters:  12/13/18 127 lb (57.6 kg)  05/28/18 119 lb (54 kg)  05/20/18 119 lb 3.2 oz (54.1 kg)    Current diet: in general, a "healthy" diet    ------------------------------------------------------------------------  Follow up on atrial fibrillation.  Denies shortness of breath, chest pain, or palpitations. Tolerating Xarelto and verapamil well without adverse effects.   He daughter does report that patients memory has been deteriorating the last several months. Patient denies feeling depressed, but her daughter states she is very bored because she cant read or see TV well which is frustrating.    Allergies  Allergen Reactions  . Maxzide  [Hydrochlorothiazide W-Triamterene]   . Sulfa Antibiotics      Current Outpatient Medications:  .  acetaminophen (TYLENOL) 325 MG tablet, Take 1-2 tablets (325-650 mg total) by mouth every 6 (six) hours as needed for mild pain (pain score 1-3 or temp > 100.5)., Disp: , Rfl:   .  benazepril (LOTENSIN) 10 MG tablet, TAKE 1 TABLET BY MOUTH EVERY DAY, Disp: 30 tablet, Rfl: 5 .  Cholecalciferol (VITAMIN D3) 2000 UNITS CHEW, Chew 1 tablet by mouth daily., Disp: 1 tablet, Rfl: 2 .  docusate sodium (COLACE) 100 MG capsule, Take 1 capsule (100 mg total) by mouth 2 (two) times daily. (Patient taking differently: Take 100 mg by mouth daily as needed. ), Disp: 10 capsule, Rfl: 0 .  erythromycin ophthalmic ointment, Place 1 application into the left eye 4 (four) times daily., Disp: 3.5 g, Rfl: 0 .  loratadine (CLARITIN) 10 MG tablet, Take by mouth daily. , Disp: , Rfl:  .  valACYclovir (VALTREX) 1000 MG tablet, Take 1 tablet (1,000 mg total) by mouth 2 (two) times daily., Disp: 14 tablet, Rfl: 0 .  verapamil (CALAN-SR) 240 MG CR tablet, TAKE ONE TABLET BY MOUTH DAILY, Disp: 30 tablet, Rfl: 11 .  XARELTO 15 MG TABS tablet, TAKE 1 TABLET BY MOUTH DAILY WITH SUPPER, Disp: 30 tablet, Rfl: 12 .  polyethylene glycol (MIRALAX / GLYCOLAX) packet, Take 17 g by mouth daily as needed for mild constipation. (Patient not taking: Reported on 12/13/2018), Disp: 14 each, Rfl: 0  Review of Systems  Constitutional: Negative.   Respiratory: Negative.   Cardiovascular: Negative.   Gastrointestinal: Positive for constipation. Negative for abdominal distention, abdominal pain, anal bleeding, blood in stool, diarrhea, nausea, rectal pain and vomiting.  Neurological: Negative for dizziness, light-headedness and headaches.    Social History  Tobacco Use  . Smoking status: Never Smoker  . Smokeless tobacco: Never Used  Substance Use Topics  . Alcohol use: No    Alcohol/week: 0.0 standard drinks      Objective:   BP 118/75 (BP Location: Left Arm, Patient Position: Sitting, Cuff Size: Normal)   Pulse 75   Temp 98.8 F (37.1 C) (Oral)   Wt 127 lb (57.6 kg)   BMI 22.50 kg/m  Vitals:   12/13/18 1120  BP: 118/75  Pulse: 75  Temp: 98.8 F (37.1 C)  TempSrc: Oral  Weight: 127 lb (57.6 kg)      Physical Exam    General Appearance:    Alert, cooperative, no distress  Eyes:    PERRL, conjunctiva/corneas clear, EOM's intact       Lungs:     Clear to auscultation bilaterally, respirations unlabored  Heart:     Irregularly irregular rhythm. Normal rate   Neurologic:   Awake, alert, oriented x 3. No apparent focal neurological           defect.            Assessment & Plan    1. Essential hypertension Well controlled.  Continue current medications.   - Comprehensive metabolic panel - TSH  2. Memory difficulties  - TSH - Vitamin B12  3. Vitamin D deficiency  - VITAMIN D 25 Hydroxy (Vit-D Deficiency, Fractures)  4. Hyperlipidemia, unspecified hyperlipidemia type  - CBC - Lipid panel  The entirety of the information documented in the History of Present Illness, Review of Systems and Physical Exam were personally obtained by me. Portions of this information were initially documented by Kavin LeechLaura Walsh, CMA and reviewed by me for thoroughness and accuracy.      Mila Merryonald Fisher, MD  [facility]

## 2018-12-16 ENCOUNTER — Telehealth: Payer: Self-pay | Admitting: Family Medicine

## 2018-12-16 DIAGNOSIS — R413 Other amnesia: Secondary | ICD-10-CM | POA: Diagnosis not present

## 2018-12-16 DIAGNOSIS — I10 Essential (primary) hypertension: Secondary | ICD-10-CM | POA: Diagnosis not present

## 2018-12-16 DIAGNOSIS — E559 Vitamin D deficiency, unspecified: Secondary | ICD-10-CM | POA: Diagnosis not present

## 2018-12-16 DIAGNOSIS — E785 Hyperlipidemia, unspecified: Secondary | ICD-10-CM | POA: Diagnosis not present

## 2018-12-16 NOTE — Chronic Care Management (AMB) (Signed)
°  Chronic Care Management   Outreach Note  12/16/2018 Name: Tami [name] MRN: [id number] DOB: Aug 20, 1923  Referred by: Birdie Sons, MD Reason for referral : Chronic Care Management (Initial CCM outreach was unsuccessful. )   An unsuccessful telephone outreach was attempted today. The patient was referred to the case management team by for assistance with chronic care management and care coordination.   Follow Up Plan: A HIPPA compliant phone message was left for the patient providing contact information and requesting a return call.  The care management team will reach out to the patient again over the next 7 days.  If patient returns call to provider office, please advise to call [place] at [place]  [email or web address]   ??[phone]

## 2018-12-17 ENCOUNTER — Telehealth: Payer: Self-pay

## 2018-12-17 ENCOUNTER — Telehealth: Payer: Self-pay | Admitting: Family Medicine

## 2018-12-17 LAB — LIPID PANEL
Chol/HDL Ratio: 2.6 ratio (ref 0.0–4.4)
Cholesterol, Total: 235 mg/dL — ABNORMAL HIGH (ref 100–199)
HDL: 89 mg/dL (ref >39)
LDL Calculated: 127 mg/dL — ABNORMAL HIGH (ref 0–99)
Triglycerides: 93 mg/dL (ref 0–149)
VLDL Cholesterol Cal: 19 mg/dL (ref 5–40)

## 2018-12-17 LAB — CBC
Hematocrit: 33.2 % — ABNORMAL LOW (ref 34.0–46.6)
Hemoglobin: 11.6 g/dL (ref 11.1–15.9)
MCH: 34.2 pg — ABNORMAL HIGH (ref 26.6–33.0)
MCHC: 34.9 g/dL (ref 31.5–35.7)
MCV: 98 fL — ABNORMAL HIGH (ref 79–97)
Platelets: 137 10*3/uL — ABNORMAL LOW (ref 150–450)
RBC: 3.39 x10E6/uL — ABNORMAL LOW (ref 3.77–5.28)
RDW: 11.9 % (ref 11.7–15.4)
WBC: 5.2 10*3/uL (ref 3.4–10.8)

## 2018-12-17 LAB — VITAMIN D 25 HYDROXY (VIT D DEFICIENCY, FRACTURES): Vit D, 25-Hydroxy: 32 ng/mL (ref 30.0–100.0)

## 2018-12-17 LAB — COMPREHENSIVE METABOLIC PANEL
ALT: 12 IU/L (ref 0–32)
AST: 16 IU/L (ref 0–40)
Albumin/Globulin Ratio: 2.1 (ref 1.2–2.2)
Albumin: 4.5 g/dL (ref 3.5–4.6)
Alkaline Phosphatase: 69 IU/L (ref 39–117)
BUN/Creatinine Ratio: 19 (ref 12–28)
BUN: 24 mg/dL (ref 10–36)
Bilirubin Total: 0.5 mg/dL (ref 0.0–1.2)
CO2: 21 mmol/L (ref 20–29)
Calcium: 9.3 mg/dL (ref 8.7–10.3)
Chloride: 101 mmol/L (ref 96–106)
Creatinine, Ser: 1.25 mg/dL — ABNORMAL HIGH (ref 0.57–1.00)
GFR calc Af Amer: 43 mL/min/{1.73_m2} — ABNORMAL LOW (ref >59)
GFR calc non Af Amer: 37 mL/min/{1.73_m2} — ABNORMAL LOW (ref >59)
Globulin, Total: 2.1 g/dL (ref 1.5–4.5)
Glucose: 95 mg/dL (ref 65–99)
Potassium: 4.6 mmol/L (ref 3.5–5.2)
Sodium: 145 mmol/L — ABNORMAL HIGH (ref 134–144)
Total Protein: 6.6 g/dL (ref 6.0–8.5)

## 2018-12-17 LAB — VITAMIN B12: Vitamin B-12: 320 pg/mL (ref 232–1245)

## 2018-12-17 LAB — TSH: TSH: 5.88 u[IU]/mL — ABNORMAL HIGH (ref 0.450–4.500)

## 2018-12-17 NOTE — Chronic Care Management (AMB) (Signed)
Chronic Care Management   Note  12/17/2018 Name: Tami [name] MRN: [id number] DOB: 1924/01/28  Tami [name] is a 85 y.o. year old female who is a primary care patient of Fisher, Kirstie Peri, MD. I reached out to [name] by phone today in response to a referral sent by [name]'s health plan.    [name] was given information about Chronic Care Management services today including:  1. CCM service includes personalized support from designated clinical staff supervised by her physician, including individualized plan of care and coordination with other care providers 2. 24/7 contact phone numbers for assistance for urgent and routine care needs. 3. Service will only be billed when office clinical staff spend 20 minutes or more in a month to coordinate care. 4. Only one practitioner may furnish and bill the service in a calendar month. 5. The patient may stop CCM services at any time (effective at the end of the month) by phone call to the office staff. 6. The patient will be responsible for cost sharing (co-pay) of up to 20% of the service fee (after annual deductible is met).  Patients daughter [name] agreed to services and verbal consent obtained.   Follow up plan: Telephone appointment with CCM team member scheduled for: 12/18/2018  [place]  [email or web address]   ??[phone]

## 2018-12-17 NOTE — Telephone Encounter (Signed)
-----   Message from Birdie Sons, MD sent at 12/17/2018 10:01 AM EDT ----- Labs are good except a little dehydrated, need to drink more fluids. Continue current medications.  Schedule follow up 6 months.

## 2018-12-17 NOTE — Telephone Encounter (Signed)
Pt advised.   Thanks,   -Gottlieb Zuercher  

## 2018-12-17 NOTE — Telephone Encounter (Signed)
Left message for patient to call back about lab results.

## 2018-12-18 ENCOUNTER — Ambulatory Visit: Payer: Medicare Other | Admitting: *Deleted

## 2018-12-18 ENCOUNTER — Encounter: Payer: Self-pay | Admitting: *Deleted

## 2018-12-18 DIAGNOSIS — R413 Other amnesia: Secondary | ICD-10-CM

## 2018-12-18 DIAGNOSIS — M810 Age-related osteoporosis without current pathological fracture: Secondary | ICD-10-CM

## 2018-12-19 NOTE — Chronic Care Management (AMB) (Signed)
  Chronic Care Management    Clinical Social Work General Note  12/19/2018 Name: Tami [name] MRN: [id number] DOB: March 25, 1924  Tami [name] is a 85 y.o. year old female who is a primary care patient of Fisher, Kirstie Peri, MD. The CCM team was consulted for assistance with [employer].   Review of patient status, including review of consultants reports, relevant laboratory and other test results, and collaboration with appropriate care team members and the patient's provider was performed as part of comprehensive patient evaluation and provision of chronic care management services.    Goals Addressed            This Visit's Progress   . "I would like resources to help my mother" (pt-stated)       This social worker spoke with patient's adult daughter today to discuss needs. Per patient's daughter, patient is hearing impaired and cannot hear over the phone. Per patient's daughter, patient is both hearing and sight impaired which is very limiting. Patient's daughter is the  main caregiver, however reports having her own medical issues and is overwhelmed with  patient care.  In home care, adult day care and facility care options discussed with patient's daughter.  Current Barriers:  . Limited social support . Lacks knowledge of community resource: related to in home and out of the homecare   Clinical Social Work Clinical Goal(s):  Marland Kitchen Over the next 60 days, client will work with SW to address concerns related to available resources for in home and out of the home care  Interventions: . Patient's daughter interviewed and appropriate assessments performed . Provided patient's daughter with information about in home care, Adult Day programs and facility care . Discussed plans with patient for ongoing care management follow up and provided patient with direct contact information for care management team . Provided education to patient/caregiver regarding level of care options.   Patient Self Care Activities:  . Currently UNABLE TO independently complete her ADL's and IADL's independently  Initial goal documentation          Follow Up Plan: SW will follow up with patient by phone over the next 2 weeks     [place], [place] Worker  [place] Management [phone]

## 2018-12-19 NOTE — Patient Instructions (Addendum)
Thank you allowing the Chronic Care Management Team to be a part of your care! It was a pleasure speaking with you today!  1. Please call your CCM  social worker with any questions or concerns regarding community resources for patient.  CCM (Chronic Care Management) Team   Trish Fountain RN, BSN Nurse Care Coordinator  [phone]  Ruben Reason PharmD  Clinical Pharmacist  [phone]   Elliot Gurney, LCSW Clinical Social Worker [phone]  Goals Addressed            This Visit's Progress   . "I would like resources to help my mother" (pt-stated)       Current Barriers:  . Limited social support . Lacks knowledge of community resource: related to in home and out of the homecare   Clinical Social Work Clinical Goal(s):  Marland Kitchen Over the next 60 days, client will work with SW to address concerns related to available resources for in home and out of the home care  Interventions: . Patient's daughter interviewed and appropriate assessments performed . Provided patient's daughter with information about in home care, Adult Day programs and facility care . Discussed plans with patient's daughter for ongoing care management follow up and provided patient with direct contact information for care management team . Provided education to patient/caregiver regarding level of care options.  Patient Self Care Activities:  . Currently UNABLE TO independently complete her ADL's and IADL's independently  Initial goal documentation         The patient verbalized understanding of instructions provided today and declined a print copy of patient instruction materials.   The care management team will reach out to the patient again over the next 14 days.

## 2019-01-10 ENCOUNTER — Ambulatory Visit: Payer: Self-pay | Admitting: *Deleted

## 2019-01-10 DIAGNOSIS — I10 Essential (primary) hypertension: Secondary | ICD-10-CM

## 2019-01-10 DIAGNOSIS — R413 Other amnesia: Secondary | ICD-10-CM

## 2019-01-10 NOTE — Chronic Care Management (AMB) (Signed)
  Chronic Care Management    Clinical Social Work Follow Up Note  01/10/2019 Name: Tami [name] MRN: [id number] DOB: 1924-04-03  Tami [name] is a 85 y.o. year old female who is a primary care patient of Fisher, Kirstie Peri, MD. The CCM team was consulted for assistance with [employer] .   Review of patient status, including review of consultants reports, other relevant assessments, and collaboration with appropriate care team members and the patient's provider was performed as part of comprehensive patient evaluation and provision of chronic care management services.     Goals Addressed            This Visit's Progress   . "I would like resources to help my mother" (pt-stated)       Current Barriers:  . Limited social support . Lacks knowledge of community resource: related to in home and out of the homecare   Clinical Social Work Clinical Goal(s):  Marland Kitchen Over the next 60 days, client will work with SW to address concerns related to available resources for in home and out of the home care  Interventions: . Patient's daughter interviewed and appropriate assessments performed . Re-visited with  patient's daughter information about in home care, Adult Day programs and Respite care . Confirmed with patient's daughter that patient feels safest at her home due to her hearing and sight impairments and is not interested in day programs. Respite care likely suspended due to COVID . Discussed plan for patient's daughter to have cataract surgery soon with plan for her son to check on patient while she is getting procedure done in October . Discussed plan for patient's daughter to get patient's hearing aid checked for repairs to assist with her hearing.  . Discussed obtaining a reading magnifier to assist with reading materials . Assessed for any additional community resource needs and provided patient with direct contact information for care management team to utilize in the future if  needed.   Patient Self Care Activities:  . Currently UNABLE TO independently complete her ADL's and IADL's independently  Please see past updates related to this goal by clicking on the "Past Updates" button in the selected goal          Follow Up Plan: Client's daughter will contact this social worker with any additional community reosurce needs in the future if needed    [employer], [place] Worker  [place] Care Management [phone]

## 2019-01-10 NOTE — Patient Instructions (Signed)
Thank you allowing the Chronic Care Management Team to be a part of your care! It was a pleasure speaking with you today!  1. Please call the ENT to schedule time to have patient's hearing aid assessed for needed repairs 2. Please cal this social worker if there are any community resource needs in the future  CCM (Chronic Care Management) Team   Trish Fountain RN, BSN Nurse Care Coordinator  [phone]  Ruben Reason PharmD  Clinical Pharmacist  [phone]   Elliot Gurney, LCSW Clinical Social Worker [phone]  Goals Addressed            This Visit's Progress   . "I would like resources to help my mother" (pt-stated)       Current Barriers:  . Limited social support . Lacks knowledge of community resource: related to in home and out of the homecare   Clinical Social Work Clinical Goal(s):  Marland Kitchen Over the next 60 days, client will work with SW to address concerns related to available resources for in home and out of the home care  Interventions: . Patient's daughter interviewed and appropriate assessments performed . Re-visited with  patient's daughter information about in home care, Adult Day programs and Respite care . Confirmed with patient's daughter that patient feels safest at her home due to her hearing and sight impairments and is not interested in day programs. Respite care likely suspended due to COVID . Discussed plan for patient's daughter to have cataract surgery soon with plan for her son to check on patient while she is getting procedure done in October . Discussed plan for patient's daughter to get patient's hearing aid checked for repairs to assist with her hearing.  . Discussed obtaining a reading magnifier to assist with reading materials . Assessed for any additional community resource needs and provided patient with direct contact information for care management team to utilize in the future if needed.   Patient Self Care Activities:  . Currently  UNABLE TO independently complete her ADL's and IADL's independently  Please see past updates related to this goal by clicking on the "Past Updates" button in the selected goal          The patient verbalized understanding of instructions provided today and declined a print copy of patient instruction materials.   No further follow up required: patient's daughter to contact this Education officer, museum with any additiona l community resource needs in the future

## 2019-03-15 ENCOUNTER — Other Ambulatory Visit: Payer: Self-pay | Admitting: Family Medicine

## 2019-03-19 ENCOUNTER — Telehealth: Payer: Self-pay | Admitting: Family Medicine

## 2019-03-19 NOTE — Telephone Encounter (Signed)
We have 3 samples left in office is it okay to give to patient? KW

## 2019-03-19 NOTE — Telephone Encounter (Signed)
Patients daughter advised. KW

## 2019-03-19 NOTE — Telephone Encounter (Signed)
Patient is in the donut hole and would like to get sample of Xarelto 15 mg. to last her for a month.  They are very expensive and needs some help getting these.  Please let [name] know if we have samples.

## 2019-03-19 NOTE — Telephone Encounter (Signed)
That's fine

## 2019-04-01 ENCOUNTER — Telehealth: Payer: Self-pay | Admitting: Family Medicine

## 2019-04-01 MED ORDER — RIVAROXABAN 15 MG PO TABS: ORAL_TABLET | ORAL | 0 refills | Status: DC

## 2019-04-01 NOTE — Telephone Encounter (Signed)
Samples left at front desk. Patient's daughter will be by the office on Friday to pick up samples.

## 2019-04-01 NOTE — Telephone Encounter (Signed)
Pt's Daughter calling about XARELTO 15 MG TABS tablet free samples for Nov and Dec.  Please call  Cher Nakai [facility]) [phone] (H)   Thanks, [place]

## 2019-04-01 NOTE — Telephone Encounter (Signed)
Can have 1 month samples if she likes.

## 2019-05-23 ENCOUNTER — Telehealth: Payer: Self-pay | Admitting: Family Medicine

## 2019-05-23 NOTE — Telephone Encounter (Signed)
Pt need more samples of xarelto 15 mg. Pt has 2 pills left

## 2019-05-23 NOTE — Telephone Encounter (Signed)
Pt advised to pick samples up.

## 2019-06-17 ENCOUNTER — Other Ambulatory Visit: Payer: Self-pay

## 2019-06-17 ENCOUNTER — Ambulatory Visit (INDEPENDENT_AMBULATORY_CARE_PROVIDER_SITE_OTHER): Payer: Medicare Other | Admitting: Family Medicine

## 2019-06-17 ENCOUNTER — Encounter: Payer: Self-pay | Admitting: Family Medicine

## 2019-06-17 VITALS — BP 122/72 | HR 84 | Temp 96.7°F | Resp 16 | Wt 126.0 lb

## 2019-06-17 DIAGNOSIS — I10 Essential (primary) hypertension: Secondary | ICD-10-CM | POA: Diagnosis not present

## 2019-06-17 DIAGNOSIS — Z23 Encounter for immunization: Secondary | ICD-10-CM | POA: Diagnosis not present

## 2019-06-17 DIAGNOSIS — I4891 Unspecified atrial fibrillation: Secondary | ICD-10-CM | POA: Diagnosis not present

## 2019-06-17 NOTE — Progress Notes (Signed)
Patient: Tami [name] Female    DOB: 1923/12/02   86 y.o.   MRN: [id number] Visit Date: 06/17/2019  Today's Provider: Lelon Huh, MD   Chief Complaint  Patient presents with  . Hypertension   Subjective:     HPI  Hypertension, follow-up:  BP Readings from Last 3 Encounters:  06/17/19 122/72  12/13/18 118/75  05/28/18 (!) 157/68    She was last seen for hypertension 6 months ago.  BP at that visit was 118/75. Management since that visit includes advising patient to drink more water due to dehydration. She reports good compliance with treatment. She is not having side effects.  She is not exercising. She is adherent to low salt diet.   Outside blood pressures are not checked. She is experiencing none.  Patient denies chest pain, chest pressure/discomfort, claudication, dyspnea, exertional chest pressure/discomfort, fatigue, irregular heart beat, lower extremity edema, near-syncope, orthopnea, palpitations, paroxysmal nocturnal dyspnea, syncope and tachypnea.   Cardiovascular risk factors include advanced age (older than 17 for men, 9 for women) and hypertension.  Use of agents associated with hypertension: none.     Weight trend: stable Wt Readings from Last 3 Encounters:  06/17/19 126 lb (57.2 kg)  12/13/18 127 lb (57.6 kg)  05/28/18 119 lb (54 kg)    Current diet: well balanced  ------------------------------------------------------------------------  Follow up for Vitamin D Deficiency:  The patient was last seen for this 6 months ago. Changes made at last visit include none.  She reports good compliance with treatment. She feels that condition is Unchanged. She is not having side effects.   ------------------------------------------------------------------------------------  Allergies  Allergen Reactions  . Maxzide  [Hydrochlorothiazide W-Triamterene]   . Sulfa Antibiotics      Current Outpatient Medications:  .  acetaminophen  (TYLENOL) 325 MG tablet, Take 1-2 tablets (325-650 mg total) by mouth every 6 (six) hours as needed for mild pain (pain score 1-3 or temp > 100.5)., Disp: , Rfl:  .  benazepril (LOTENSIN) 10 MG tablet, TAKE 1 TABLET BY MOUTH EVERY DAY, Disp: 90 tablet, Rfl: 4 .  Cholecalciferol (VITAMIN D3) 2000 UNITS CHEW, Chew 1 tablet by mouth daily., Disp: 1 tablet, Rfl: 2 .  docusate sodium (COLACE) 100 MG capsule, Take 1 capsule (100 mg total) by mouth 2 (two) times daily. (Patient taking differently: Take 100 mg by mouth daily as needed. ), Disp: 10 capsule, Rfl: 0 .  erythromycin ophthalmic ointment, Place 1 application into the left eye 4 (four) times daily., Disp: 3.5 g, Rfl: 0 .  loratadine (CLARITIN) 10 MG tablet, Take by mouth daily. , Disp: , Rfl:  .  polyethylene glycol (MIRALAX / GLYCOLAX) packet, Take 17 g by mouth daily as needed for mild constipation., Disp: 14 each, Rfl: 0 .  Rivaroxaban (XARELTO) 15 MG TABS tablet, TAKE 1 TABLET BY MOUTH DAILY WITH SUPPER, Disp: 28 tablet, Rfl: 0 .  valACYclovir (VALTREX) 1000 MG tablet, Take 1 tablet (1,000 mg total) by mouth 2 (two) times daily., Disp: 14 tablet, Rfl: 0 .  verapamil (CALAN-SR) 240 MG CR tablet, TAKE ONE TABLET BY MOUTH DAILY, Disp: 30 tablet, Rfl: 11  Review of Systems  Constitutional: Negative for appetite change, chills, fatigue and fever.  Respiratory: Negative for chest tightness and shortness of breath.   Cardiovascular: Negative for chest pain and palpitations.  Gastrointestinal: Negative for abdominal pain, nausea and vomiting.  Neurological: Negative for dizziness and weakness.    Social History   Tobacco Use  .  Smoking status: Never Smoker  . Smokeless tobacco: Never Used  Substance Use Topics  . Alcohol use: No    Alcohol/week: 0.0 standard drinks      Objective:   BP 122/72   Pulse 84   Temp (!) 96.7 F (35.9 C) (Temporal)   Resp 16   Wt 126 lb (57.2 kg)   SpO2 98% Comment: room air  BMI 22.32 kg/m  Vitals:    06/17/19 1111 06/17/19 1115  BP: 140/80 122/72  Pulse: 84   Resp: 16   Temp: (!) 96.7 F (35.9 C)   TempSrc: Temporal   SpO2: 98%   Weight: 126 lb (57.2 kg)   Body mass index is 22.32 kg/m.   Physical Exam   General Appearance:    Well developed, well nourished female in no acute distress  Eyes:    PERRL, conjunctiva/corneas clear, EOM's intact       Lungs:     Clear to auscultation bilaterally, respirations unlabored  Heart:    Normal heart rate. Irregularly irregular rhythm. No murmurs, rubs, or gallops.   MS:   All extremities are intact.   Neurologic:   Awake, alert, oriented x 3. No apparent focal neurological           defect.           Assessment & Plan    1. Need for influenza vaccination  - Flu Vaccine QUAD High Dose(Fluad)  2. Atrial fibrillation, unspecified type (HCC) Rate well controlled. Continue current medications.  Continue NOAC  3. Essential hypertension Well controlled.  Continue current medications.   Follow up 6 months;     Mila Merry, MD  [facility]

## 2019-07-08 ENCOUNTER — Other Ambulatory Visit: Payer: Self-pay | Admitting: Family Medicine

## 2019-08-28 ENCOUNTER — Telehealth: Payer: Self-pay

## 2019-08-28 NOTE — Telephone Encounter (Signed)
Copied from CRM [id number]. Topic: General - Inquiry >> Aug 28, 2019 10:50 AM Deborha Payment wrote: Reason for CRM: Patient daughter "Okey Dupre" called stating that she is requesting health aid for patient.   Call back [phone]

## 2019-08-28 NOTE — Telephone Encounter (Signed)
I called patient's daughter Okey Dupre to clarify her request. Okey Dupre states that she would like Dr. Sherrie Mustache to send orders for a home health nurse aid to come into patient's home 2-3 days each week to help with bathing. She states that her PCP Dr. Patrice Paradise doesn't feel that Okey Dupre is physically able to do this for the patient anymore due to her own health conditions. [name] states she is doing the best she can for her mom, but is physically exhausted and needs help.

## 2019-08-29 NOTE — Telephone Encounter (Signed)
Patient's daughter Okey Dupre advised and verbally voiced understanding. [name] states that a home health doctor with [employer] came out their home and did an assessment on the patient. [name] states that that doctor recommends that patient should have a home health aid. She is going to try contacting the doctor with patient's insurance to see if they will be able to use his assessment to order a home health aid. If this doesn't work, she will call our office back to schedule an appointment for a face to face in office visit.

## 2019-08-29 NOTE — Telephone Encounter (Signed)
Medicare requires face to face in-office visit to document medical necessity, otherwise patient will have to pay for service out of pocket.

## 2019-09-01 NOTE — Progress Notes (Signed)
Subjective:   Tami [name] is a 86 y.o. female who presents for Medicare Annual (Subsequent) preventive examination.    This visit is being conducted through telemedicine due to the COVID-19 pandemic. This patient has given me verbal consent via doximity to conduct this visit, patient states they are participating from their home address. Some vital signs may be absent or patient reported.    Patient identification: identified by name, DOB, and current address  Review of Systems:  N/A  Cardiac Risk Factors include: advanced age (>48men, >29 women)     Objective:     Vitals: There were no vitals taken for this visit.  There is no height or weight on file to calculate BMI. Unable to obtain vitals due to visit being conducted via telephonically.   Advanced Directives 09/02/2019 05/28/2018 05/07/2018 12/27/2017 12/26/2017 12/26/2017  Does Patient Have a Medical Advance Directive? No No No No No No  Would patient like information on creating a medical advance directive? No - Patient declined No - Patient declined Yes (MAU/Ambulatory/Procedural Areas - Information given) - No - Patient declined No - Patient declined    Tobacco Social History   Tobacco Use  Smoking Status Never Smoker  Smokeless Tobacco Never Used     Counseling given: No   Clinical Intake:  Pre-visit preparation completed: Yes  Pain : No/denies pain Pain Score: 0-No pain     Nutritional Risks: None Diabetes: No  How often do you need to have someone help you when you read instructions, pamphlets, or other written materials from your doctor or pharmacy?: 5 - Always(Pt is blind in both eyes.)  Interpreter Needed?: No  Information entered by :: [facility], LPN  Past Medical History:  Diagnosis Date  . Allergy   . Chicken pox   . Chronic kidney disease   . Closed left hip fracture ([place]) 12/26/2017  . Hyperlipidemia   . Hypertension   . Measles   . Mumps   . Osteoarthritis   . Osteoporosis   .  Periorbital cellulitis of left eye 01/04/2015  . Vitamin D deficiency    Past Surgical History:  Procedure Laterality Date  . ABDOMINAL HYSTERECTOMY    . APPENDECTOMY    . Excision of inferior parathyroid gland Left 05/25/2004   Dr. Bary Castilla  . Fracture of Fibula/Tibula Left   . HIP SURGERY    . INTRAMEDULLARY (IM) NAIL INTERTROCHANTERIC Left 12/27/2017   Procedure: INTRAMEDULLARY (IM) NAIL INTERTROCHANTRIC- AFFIXUS;  Surgeon: Hessie Knows, MD;  Location: [facility] ORS;  Service: Orthopedics;  Laterality: Left;  . ORIF WRIST FRACTURE Left 12/27/2017   Procedure: OPEN REDUCTION INTERNAL FIXATION (ORIF) WRIST FRACTURE;  Surgeon: Hessie Knows, MD;  Location: [facility] ORS;  Service: Orthopedics;  Laterality: Left;  . OVARIAN CYST REMOVAL     Family History  Problem Relation Age of Onset  . Heart disease Mother   . Hypertension Mother   . Hypertension Father   . Lung cancer Daughter    Social History   Socioeconomic History  . Marital status: Widowed    Spouse name: Not on file  . Number of children: 2  . Years of education: Not on file  . Highest education level: 8th grade  Occupational History  . Occupation: retired  Tobacco Use  . Smoking status: Never Smoker  . Smokeless tobacco: Never Used  Substance and Sexual Activity  . Alcohol use: No    Alcohol/week: 0.0 standard drinks  . Drug use: No  . Sexual activity: Not Currently  Other Topics Concern  . Not on file  Social History Narrative  . Not on file   Social Determinants of Health   Financial Resource Strain: Low Risk   . Difficulty of Paying Living Expenses: Not hard at all  Food Insecurity: No Food Insecurity  . Worried About Programme researcher, broadcasting/film/video in the Last Year: Never true  . Ran Out of Food in the Last Year: Never true  Transportation Needs: No Transportation Needs  . Lack of Transportation (Medical): No  . Lack of Transportation (Non-Medical): No  Physical Activity: Inactive  . Days of Exercise per Week: 0 days  .  Minutes of Exercise per Session: 0 min  Stress: Stress Concern Present  . Feeling of Stress : To some extent  Social Connections: Moderately Isolated  . Frequency of Communication with Friends and Family: Never  . Frequency of Social Gatherings with Friends and Family: More than three times a week  . Attends Religious Services: Never  . Active Member of Clubs or Organizations: No  . Attends Banker Meetings: Never  . Marital Status: Widowed    Outpatient Encounter Medications as of 09/02/2019  Medication Sig  . acetaminophen (TYLENOL) 500 MG tablet Take 500 mg by mouth at bedtime.  . benazepril (LOTENSIN) 10 MG tablet TAKE 1 TABLET BY MOUTH EVERY DAY  . Cholecalciferol (VITAMIN D3) 2000 UNITS CHEW Chew 1 tablet by mouth daily.  Marland Kitchen loratadine (CLARITIN) 10 MG tablet Take by mouth daily.   . polyethylene glycol (MIRALAX / GLYCOLAX) packet Take 17 g by mouth daily as needed for mild constipation. (Patient taking differently: Take 17 g by mouth daily. )  . verapamil (CALAN-SR) 240 MG CR tablet TAKE ONE TABLET BY MOUTH DAILY  . XARELTO 15 MG TABS tablet TAKE 1 TABLET BY MOUTH DAILY WITH SUPPER  . acetaminophen (TYLENOL) 325 MG tablet Take 1-2 tablets (325-650 mg total) by mouth every 6 (six) hours as needed for mild pain (pain score 1-3 or temp > 100.5). (Patient not taking: Reported on 09/02/2019)  . docusate sodium (COLACE) 100 MG capsule Take 1 capsule (100 mg total) by mouth 2 (two) times daily. (Patient not taking: Reported on 09/02/2019)  . erythromycin ophthalmic ointment Place 1 application into the left eye 4 (four) times daily. (Patient not taking: Reported on 09/02/2019)  . valACYclovir (VALTREX) 1000 MG tablet Take 1 tablet (1,000 mg total) by mouth 2 (two) times daily. (Patient not taking: Reported on 09/02/2019)   No facility-administered encounter medications on file as of 09/02/2019.    Activities of Daily Living In your present state of health, do you have any  difficulty performing the following activities: 09/02/2019 06/17/2019  Hearing? Malvin Johns  Comment Is deaf. -  Vision? Y Y  Comment Is blind in both eyes. -  Difficulty concentrating or making decisions? Y Y  Comment Has memory loss. -  Walking or climbing stairs? Y Y  Comment Due to being unstable with vision issues. -  Dressing or bathing? Y N  Comment Daughter assists with dressing. -  Doing errands, shopping? Y Y  Comment Does not drive. -  Preparing Food and eating ? Y -  Comment Daughter does the cooking. -  Using the Toilet? N -  In the past six months, have you accidently leaked urine? N -  Do you have problems with loss of bowel control? N -  Managing your Medications? Y -  Comment Daughter manages medications. -  Managing your Finances? Y -  Comment Daughter manages finances. -  Housekeeping or managing your Housekeeping? Y -  Comment Daughter does the house keeping. -  Some recent data might be hidden    Patient Care Team: Malva Limes, MD as PCP - General (Family Medicine) Wenda Overland, [place] as Social Worker    Assessment:   This is a routine wellness examination for Nikia.  Exercise Activities and Dietary recommendations Current Exercise Habits: The patient does not participate in regular exercise at present, Exercise limited by: orthopedic condition(s);Other - see comments(Is legally blind.)  Goals      Patient Stated   . "I would like resources to help my mother" (pt-stated)     Current Barriers:  . Limited social support . Lacks knowledge of community resource: related to in home and out of the homecare   Clinical Social Work Clinical Goal(s):  Marland Kitchen Over the next 60 days, client will work with SW to address concerns related to available resources for in home and out of the home care  Interventions: . Patient's daughter interviewed and appropriate assessments performed . Re-visited with  patient's daughter information about in home care, Adult Day  programs and Respite care . Confirmed with patient's daughter that patient feels safest at her home due to her hearing and sight impairments and is not interested in day programs. Respite care likely suspended due to COVID . Discussed plan for patient's daughter to have cataract surgery soon with plan for her son to check on patient while she is getting procedure done in October . Discussed plan for patient's daughter to get patient's hearing aid checked for repairs to assist with her hearing.  . Discussed obtaining a reading magnifier to assist with reading materials . Assessed for any additional community resource needs and provided patient with direct contact information for care management team to utilize in the future if needed.   Patient Self Care Activities:  . Currently UNABLE TO independently complete her ADL's and IADL's independently  Please see past updates related to this goal by clicking on the "Past Updates" button in the selected goal        Other   . DIET - INCREASE WATER INTAKE     Recommend to drink at least 6-8 8oz glasses of water per day.       Fall Risk: Fall Risk  09/02/2019 06/17/2019 05/07/2018 09/04/2017 08/16/2016  Falls in the past year? 0 0 1 No Yes  Number falls in past yr: 0 0 0 - 1  Injury with Fall? 0 0 1 - No  Comment - - broken hip; f/u with Dr Rosita Kea - -  Risk for fall due to : Impaired balance/gait;Impaired mobility;Impaired vision - Impaired vision - -  Follow up - Falls evaluation completed Falls prevention discussed - Falls prevention discussed    FALL RISK PREVENTION PERTAINING TO THE HOME:  Any stairs in or around the home? Yes  If so, are there any without handrails? No   Home free of loose throw rugs in walkways, pet beds, electrical cords, etc? Yes  Adequate lighting in your home to reduce risk of falls? Yes   ASSISTIVE DEVICES UTILIZED TO PREVENT FALLS:  Life alert? No  Use of a cane, walker or w/c? Yes  Grab bars in the bathroom? No    Shower chair or bench in shower? No  Elevated toilet seat or a handicapped toilet? Yes    TIMED UP AND GO:  Was the test performed? No .  Depression Screen PHQ 2/9 Scores 09/02/2019 12/18/2018 12/13/2018 05/07/2018  PHQ - 2 Score 4 4 4  0  PHQ- 9 Score 14 12 15  -     Cognitive Function: Unable to complete due to completing the AWV with daughter.      6CIT Screen 05/07/2018  What Year? 0 points  What month? 0 points  What time? 0 points  Count back from 20 0 points  Months in reverse 0 points  Repeat phrase 6 points  Total Score 6    Immunization History  Administered Date(s) Administered  . Fluad Quad(high Dose 65+) 06/17/2019  . Influenza, High Dose Seasonal PF 02/10/2015, 02/21/2016, 05/07/2018  . Influenza,inj,Quad PF,6+ Mos 02/14/2017  . Pneumococcal Conjugate-13 01/12/2014  . Pneumococcal Polysaccharide-23 06/26/1994  . Td 06/26/1994    Qualifies for Shingles Vaccine? Yes . Due for Shingrix. Pt has been advised to call insurance company to determine out of pocket expense. Advised may also receive vaccine at local pharmacy or Health Dept. Verbalized acceptance and understanding.  Tdap: Although this vaccine is not a covered service during a Wellness Exam, does the patient still wish to receive this vaccine today?  No . Advised may receive this vaccine at local pharmacy or Health Dept. Aware to provide a copy of the vaccination record if obtained from local pharmacy or Health Dept. Verbalized acceptance and understanding.  Flu Vaccine: Up to date  Pneumococcal Vaccine: Completed series  Screening Tests Health Maintenance  Topic Date Due  . DEXA SCAN  02/18/2019  . TETANUS/TDAP  09/01/2020 (Originally 06/26/2004)  . INFLUENZA VACCINE  Completed  . PNA vac Low Risk Adult  Completed    Cancer Screenings:  Colorectal Screening: No longer required.   Mammogram: No longer required.   Bone Density: Completed 02/17/14. Results reflect OSTEOPENIA. Repeat every 5  years. Daughter declined a DEXA order at this time.   Lung Cancer Screening: (Low Dose CT Chest recommended if Age 74-80 years, 30 pack-year currently smoking OR have quit w/in 15years.) does not qualify.   Additional Screening:  Vision Screening: Recommended annual ophthalmology exams for early detection of glaucoma and other disorders of the eye.  Dental Screening: Recommended annual dental exams for proper oral hygiene  Community Resource Referral:  CRR required this visit?  No       Plan:  I have personally reviewed and addressed the Medicare Annual Wellness questionnaire and have noted the following in the patient's chart:  A. Medical and social history B. Use of alcohol, tobacco or illicit drugs  C. Current medications and supplements D. Functional ability and status E.  Nutritional status F.  Physical activity G. Advance directives H. List of other physicians I.  Hospitalizations, surgeries, and ER visits in previous 12 months J.  Vitals K. Screenings such as hearing and vision if needed, cognitive and depression L. Referrals and appointments   In addition, I have reviewed and discussed with patient certain preventive protocols, quality metrics, and best practice recommendations. A written personalized care plan for preventive services as well as general preventive health recommendations were provided to patient. Nurse Health Advisor  Signed,    Donathan Buller [place], 02/19/14  [place] Nurse Health Advisor   Nurse Notes: Daughter declined a DEXA order at this time. F/u apt made to discuss depression screening results and discuss HH.

## 2019-09-02 ENCOUNTER — Other Ambulatory Visit: Payer: Self-pay

## 2019-09-02 ENCOUNTER — Ambulatory Visit (INDEPENDENT_AMBULATORY_CARE_PROVIDER_SITE_OTHER): Payer: Medicare Other

## 2019-09-02 DIAGNOSIS — Z0000 Encounter for general adult medical examination without abnormal findings: Secondary | ICD-10-CM | POA: Diagnosis not present

## 2019-09-02 NOTE — Patient Instructions (Signed)
[name] , Thank you for taking time to come for your Medicare Wellness Visit. I appreciate your ongoing commitment to your health goals. Please review the following plan we discussed and let me know if I can assist you in the future.   Screening recommendations/referrals: Colonoscopy: No longer required.  Mammogram: No longer required.  Bone Density: Currently due. Declined order at this time.  Recommended yearly ophthalmology/optometry visit for glaucoma screening and checkup Recommended yearly dental visit for hygiene and checkup  Vaccinations: Influenza vaccine: Up to date Pneumococcal vaccine: Completed series Tdap vaccine: Pt declines today.  Shingles vaccine: Pt declines today.     Advanced directives: Please bring a copy of your POA (Power of Attorney) and/or Living Will to the office once completed.   Conditions/risks identified: Recommend to increase water intake to 6-8 8 oz glasses a day.   Next appointment: 10/06/19 @ 11:00 AM with Dr Sherrie Mustache    Preventive Care 65 Years and Older, Female Preventive care refers to lifestyle choices and visits with your health care provider that can promote health and wellness. What does preventive care include?  A yearly physical exam. This is also called an annual well check.  Dental exams once or twice a year.  Routine eye exams. Ask your health care provider how often you should have your eyes checked.  Personal lifestyle choices, including:  Daily care of your teeth and gums.  Regular physical activity.  Eating a healthy diet.  Avoiding tobacco and drug use.  Limiting alcohol use.  Practicing safe sex.  Taking low-dose aspirin every day.  Taking vitamin and mineral supplements as recommended by your health care provider. What happens during an annual well check? The services and screenings done by your health care provider during your annual well check will depend on your age, overall health, lifestyle risk factors,  and family history of disease. Counseling  Your health care provider may ask you questions about your:  Alcohol use.  Tobacco use.  Drug use.  Emotional well-being.  Home and relationship well-being.  Sexual activity.  Eating habits.  History of falls.  Memory and ability to understand (cognition).  Work and work Astronomer.  Reproductive health. Screening  You may have the following tests or measurements:  Height, weight, and BMI.  Blood pressure.  Lipid and cholesterol levels. These may be checked every 5 years, or more frequently if you are over 59 years old.  Skin check.  Lung cancer screening. You may have this screening every year starting at age 84 if you have a 30-pack-year history of smoking and currently smoke or have quit within the past 15 years.  Fecal occult blood test (FOBT) of the stool. You may have this test every year starting at age 3.  Flexible sigmoidoscopy or colonoscopy. You may have a sigmoidoscopy every 5 years or a colonoscopy every 10 years starting at age 45.  Hepatitis C blood test.  Hepatitis B blood test.  Sexually transmitted disease (STD) testing.  Diabetes screening. This is done by checking your blood sugar (glucose) after you have not eaten for a while (fasting). You may have this done every 1-3 years.  Bone density scan. This is done to screen for osteoporosis. You may have this done starting at age 33.  Mammogram. This may be done every 1-2 years. Talk to your health care provider about how often you should have regular mammograms. Talk with your health care provider about your test results, treatment options, and if necessary, the  need for more tests. Vaccines  Your health care provider may recommend certain vaccines, such as:  Influenza vaccine. This is recommended every year.  Tetanus, diphtheria, and acellular pertussis (Tdap, Td) vaccine. You may need a Td booster every 10 years.  Zoster vaccine. You may need  this after age 74.  Pneumococcal 13-valent conjugate (PCV13) vaccine. One dose is recommended after age 11.  Pneumococcal polysaccharide (PPSV23) vaccine. One dose is recommended after age 84. Talk to your health care provider about which screenings and vaccines you need and how often you need them. This information is not intended to replace advice given to you by your health care provider. Make sure you discuss any questions you have with your health care provider. Document Released: 06/18/2015 Document Revised: 02/09/2016 Document Reviewed: 03/23/2015 Elsevier Interactive Patient Education  2017 [place] Prevention in the Home Falls can cause injuries. They can happen to people of all ages. There are many things you can do to make your home safe and to help prevent falls. What can I do on the outside of my home?  Regularly fix the edges of walkways and driveways and fix any cracks.  Remove anything that might make you trip as you walk through a door, such as a raised step or threshold.  Trim any bushes or trees on the path to your home.  Use bright outdoor lighting.  Clear any walking paths of anything that might make someone trip, such as rocks or tools.  Regularly check to see if handrails are loose or broken. Make sure that both sides of any steps have handrails.  Any raised decks and porches should have guardrails on the edges.  Have any leaves, snow, or ice cleared regularly.  Use sand or salt on walking paths during winter.  Clean up any spills in your garage right away. This includes oil or grease spills. What can I do in the bathroom?  Use night lights.  Install grab bars by the toilet and in the tub and shower. Do not use towel bars as grab bars.  Use non-skid mats or decals in the tub or shower.  If you need to sit down in the shower, use a plastic, non-slip stool.  Keep the floor dry. Clean up any water that spills on the floor as soon as it  happens.  Remove soap buildup in the tub or shower regularly.  Attach bath mats securely with double-sided non-slip rug tape.  Do not have throw rugs and other things on the floor that can make you trip. What can I do in the bedroom?  Use night lights.  Make sure that you have a light by your bed that is easy to reach.  Do not use any sheets or blankets that are too big for your bed. They should not hang down onto the floor.  Have a firm chair that has side arms. You can use this for support while you get dressed.  Do not have throw rugs and other things on the floor that can make you trip. What can I do in the kitchen?  Clean up any spills right away.  Avoid walking on wet floors.  Keep items that you use a lot in easy-to-reach places.  If you need to reach something above you, use a strong step stool that has a grab bar.  Keep electrical cords out of the way.  Do not use floor polish or wax that makes floors slippery. If you must use wax,  use non-skid floor wax.  Do not have throw rugs and other things on the floor that can make you trip. What can I do with my stairs?  Do not leave any items on the stairs.  Make sure that there are handrails on both sides of the stairs and use them. Fix handrails that are broken or loose. Make sure that handrails are as long as the stairways.  Check any carpeting to make sure that it is firmly attached to the stairs. Fix any carpet that is loose or worn.  Avoid having throw rugs at the top or bottom of the stairs. If you do have throw rugs, attach them to the floor with carpet tape.  Make sure that you have a light switch at the top of the stairs and the bottom of the stairs. If you do not have them, ask someone to add them for you. What else can I do to help prevent falls?  Wear shoes that:  Do not have high heels.  Have rubber bottoms.  Are comfortable and fit you well.  Are closed at the toe. Do not wear sandals.  If you  use a stepladder:  Make sure that it is fully opened. Do not climb a closed stepladder.  Make sure that both sides of the stepladder are locked into place.  Ask someone to hold it for you, if possible.  Clearly mark and make sure that you can see:  Any grab bars or handrails.  First and last steps.  Where the edge of each step is.  Use tools that help you move around (mobility aids) if they are needed. These include:  Canes.  Walkers.  Scooters.  Crutches.  Turn on the lights when you go into a dark area. Replace any light bulbs as soon as they burn out.  Set up your furniture so you have a clear path. Avoid moving your furniture around.  If any of your floors are uneven, fix them.  If there are any pets around you, be aware of where they are.  Review your medicines with your doctor. Some medicines can make you feel dizzy. This can increase your chance of falling. Ask your doctor what other things that you can do to help prevent falls. This information is not intended to replace advice given to you by your health care provider. Make sure you discuss any questions you have with your health care provider. Document Released: 03/18/2009 Document Revised: 10/28/2015 Document Reviewed: 06/26/2014 Elsevier Interactive Patient Education  2017 [employer].

## 2019-10-06 ENCOUNTER — Ambulatory Visit: Payer: Medicare Other | Admitting: Family Medicine

## 2019-10-06 NOTE — Progress Notes (Signed)
Established patient visit   Patient: Tami [name]   DOB: [birth year]   86 y.o. Female  MRN: [id number] Visit Date: 10/07/2019  Today's healthcare provider: Lelon Huh, MD   Chief Complaint  Patient presents with  . Referral for [place] as a scribe for Lelon Huh, MD.,have documented all relevant documentation on the behalf of Lelon Huh, MD,as directed by  Lelon Huh, MD while in the presence of Lelon Huh, MD.  Subjective    HPI Weakness: Patient has difficulty preforming ADLs. Her daughter Tami [name] is no longer physically able to care for her mother in this capacity due to her own health issues.  Patient's daughter C/O patient feeling down and depressed and also concerned about some memory issues. Patient is unable to cook, clean, dress or bath herself. Patient is very forgetful and unable to follow a converstation.      Medications: Outpatient Medications Prior to Visit  Medication Sig  . acetaminophen (TYLENOL) 325 MG tablet Take 1-2 tablets (325-650 mg total) by mouth every 6 (six) hours as needed for mild pain (pain score 1-3 or temp > 100.5).  Marland Kitchen acetaminophen (TYLENOL) 500 MG tablet Take 500 mg by mouth at bedtime.  . benazepril (LOTENSIN) 10 MG tablet TAKE 1 TABLET BY MOUTH EVERY DAY  . Cholecalciferol (VITAMIN D3) 2000 UNITS CHEW Chew 1 tablet by mouth daily.  Marland Kitchen loratadine (CLARITIN) 10 MG tablet Take by mouth daily.   . polyethylene glycol (MIRALAX / GLYCOLAX) packet Take 17 g by mouth daily as needed for mild constipation. (Patient taking differently: Take 17 g by mouth daily. )  . verapamil (CALAN-SR) 240 MG CR tablet TAKE ONE TABLET BY MOUTH DAILY  . XARELTO 15 MG TABS tablet TAKE 1 TABLET BY MOUTH DAILY WITH SUPPER  . [DISCONTINUED] docusate sodium (COLACE) 100 MG capsule Take 1 capsule (100 mg total) by mouth 2 (two) times daily.  . valACYclovir (VALTREX) 1000 MG tablet Take 1 tablet (1,000 mg total) by mouth 2 (two)  times daily. (Patient not taking: Reported on 10/07/2019)  . [DISCONTINUED] erythromycin ophthalmic ointment Place 1 application into the left eye 4 (four) times daily. (Patient not taking: Reported on 09/02/2019)   No facility-administered medications prior to visit.    Review of Systems  Constitutional: Negative.   Respiratory: Negative.   Cardiovascular: Negative.   Musculoskeletal: Negative.   Neurological: Positive for weakness.      Objective    BP 125/68 (BP Location: Right Arm, Patient Position: Sitting, Cuff Size: Normal)   Pulse 87   Temp (!) 97.1 F (36.2 C) (Temporal)   Wt 128 lb 12.8 oz (58.4 kg)   BMI 22.82 kg/m    Physical Exam   General: Appearance:    Well developed, well nourished female in no acute distress  Eyes:    PERRL, conjunctiva/corneas clear, EOM's intact       Lungs:     Clear to auscultation bilaterally, respirations unlabored  Heart:    Normal heart rate. Irregularly irregular rhythm. No murmurs, rubs, or gallops.   MS:   All extremities are intact.   Neurologic:   Awake, alert, oriented x 1. Requires assistance standing and ambulating.       No results found for any visits on 10/07/19.  Assessment & Plan     1. Weakness  - Ambulatory referral to Home Health  2. Moderate episode of recurrent major depressive disorder (HCC) start- FLUoxetine (PROZAC) 10 MG capsule; Take  1 capsule (10 mg total) by mouth daily.  Dispense: 30 capsule; Refill: 2  3. Dementia See how she does with SSRI, discussed option of adding donepezil in the future.  4. Atrial fibrillation, unspecified type (HCC) refill- verapamil (CALAN-SR) 240 MG CR tablet; Take 1 tablet (240 mg total) by mouth daily.  Dispense: 30 tablet; Refill: 11   Future Appointments  Date Time Provider Department Center  12/16/2019 11:00 AM Malva Limes, MD [facility]  09/08/2020  2:20 PM [facility]SE HEALTH ADVISOR [facility]         The entirety of the information documented in the History  of Present Illness, Review of Systems and Physical Exam were personally obtained by me. Portions of this information were initially documented by the CMA and reviewed by me for thoroughness and accuracy.      Mila Merry, MD  [facility] [phone] (phone) [phone] (fax)  [facility]

## 2019-10-07 ENCOUNTER — Other Ambulatory Visit: Payer: Self-pay

## 2019-10-07 ENCOUNTER — Ambulatory Visit (INDEPENDENT_AMBULATORY_CARE_PROVIDER_SITE_OTHER): Payer: Medicare Other | Admitting: Family Medicine

## 2019-10-07 ENCOUNTER — Encounter: Payer: Self-pay | Admitting: Family Medicine

## 2019-10-07 VITALS — BP 125/68 | HR 87 | Temp 97.1°F | Wt 128.8 lb

## 2019-10-07 DIAGNOSIS — F331 Major depressive disorder, recurrent, moderate: Secondary | ICD-10-CM

## 2019-10-07 DIAGNOSIS — F0390 Unspecified dementia without behavioral disturbance: Secondary | ICD-10-CM | POA: Diagnosis not present

## 2019-10-07 DIAGNOSIS — R531 Weakness: Secondary | ICD-10-CM | POA: Diagnosis not present

## 2019-10-07 DIAGNOSIS — I4891 Unspecified atrial fibrillation: Secondary | ICD-10-CM

## 2019-10-07 MED ORDER — VERAPAMIL HCL ER 240 MG PO TBCR: 240.0000 mg | EXTENDED_RELEASE_TABLET | Freq: Every day | ORAL | 11 refills | Status: AC

## 2019-10-07 MED ORDER — FLUOXETINE HCL 10 MG PO CAPS: 10.0000 mg | ORAL_CAPSULE | Freq: Every day | ORAL | 2 refills | Status: DC

## 2019-10-22 ENCOUNTER — Telehealth: Payer: Self-pay

## 2019-10-22 NOTE — Telephone Encounter (Signed)
Can you find a different HH agency, it looks like [facility] refused this one

## 2019-10-22 NOTE — Telephone Encounter (Signed)
Copied from CRM [id number]. Topic: General - Other >> Oct 21, 2019  4:34 PM Angela Nevin wrote:  Allen Norris, with [facility], states that they cannot accept requesting for [facility], as they are limited on staffing at this time.

## 2019-10-23 ENCOUNTER — Telehealth: Payer: Self-pay | Admitting: Family Medicine

## 2019-10-23 NOTE — Telephone Encounter (Signed)
Patient's daughter Okey Dupre advised.

## 2019-10-23 NOTE — Telephone Encounter (Signed)
Patient's daughter rose requesting to speak with clinical staff to discuss patient's use os Prozac. She states she feels this medication is not helping patient and would like patient to stop taking it.

## 2019-10-23 NOTE — Telephone Encounter (Signed)
That's fine, she should wean off by cutting back to 1 tablet every other day for 2 weeks, then should could stop all together.

## 2019-10-23 NOTE — Telephone Encounter (Signed)
Patient's daughter, Okey Dupre, calling to see if they could try [facility] instead.    [phone]

## 2019-10-24 ENCOUNTER — Telehealth: Payer: Self-pay | Admitting: Family Medicine

## 2019-10-24 NOTE — Telephone Encounter (Signed)
I have finally gotten a home health agency that is willing to provide services to pt but will not be able to go out until 10/28/19.They want to make sure you are ok with this

## 2019-10-24 NOTE — Telephone Encounter (Signed)
That's fine.  Thank you.

## 2019-10-27 NOTE — Telephone Encounter (Signed)
[facility] ([facility] to start service on 10/28/19

## 2019-11-18 ENCOUNTER — Telehealth: Payer: Self-pay | Admitting: Family Medicine

## 2019-11-18 DIAGNOSIS — R531 Weakness: Secondary | ICD-10-CM

## 2019-11-18 NOTE — Telephone Encounter (Addendum)
Daughter states [facility] ([facility]) has agreed to come into the home to help the pt.  It has been approved by the insurance. (per daughter) Daughter would like to know if Dr Sherrie Mustache will send an order for home health and the number of days per week to [facility].  [facility] advised daughter who is in their network that will pay for home health aid.  [facility] phone: [phone] Fax  [phone]

## 2019-11-19 NOTE — Telephone Encounter (Signed)
Order place for home health.

## 2019-11-20 NOTE — Telephone Encounter (Signed)
Patient daughter Okey Dupre called to inform Dr Sherrie Mustache that she just got off the phone with [facility] and they have not received the orders that was requested. Please resend and contact [name] with an update Ph# [phone]

## 2019-11-23 DIAGNOSIS — M810 Age-related osteoporosis without current pathological fracture: Secondary | ICD-10-CM | POA: Diagnosis not present

## 2019-11-23 DIAGNOSIS — Z7901 Long term (current) use of anticoagulants: Secondary | ICD-10-CM | POA: Diagnosis not present

## 2019-11-23 DIAGNOSIS — J309 Allergic rhinitis, unspecified: Secondary | ICD-10-CM | POA: Diagnosis not present

## 2019-11-23 DIAGNOSIS — I4891 Unspecified atrial fibrillation: Secondary | ICD-10-CM | POA: Diagnosis not present

## 2019-11-23 DIAGNOSIS — M1990 Unspecified osteoarthritis, unspecified site: Secondary | ICD-10-CM | POA: Diagnosis not present

## 2019-11-23 DIAGNOSIS — E785 Hyperlipidemia, unspecified: Secondary | ICD-10-CM | POA: Diagnosis not present

## 2019-11-23 DIAGNOSIS — I129 Hypertensive chronic kidney disease with stage 1 through stage 4 chronic kidney disease, or unspecified chronic kidney disease: Secondary | ICD-10-CM | POA: Diagnosis not present

## 2019-11-23 DIAGNOSIS — D509 Iron deficiency anemia, unspecified: Secondary | ICD-10-CM | POA: Diagnosis not present

## 2019-11-23 DIAGNOSIS — K7689 Other specified diseases of liver: Secondary | ICD-10-CM | POA: Diagnosis not present

## 2019-11-23 DIAGNOSIS — N183 Chronic kidney disease, stage 3 unspecified: Secondary | ICD-10-CM | POA: Diagnosis not present

## 2019-11-23 DIAGNOSIS — E559 Vitamin D deficiency, unspecified: Secondary | ICD-10-CM | POA: Diagnosis not present

## 2019-11-24 ENCOUNTER — Telehealth: Payer: Self-pay | Admitting: Family Medicine

## 2019-11-24 NOTE — Telephone Encounter (Signed)
That's fine

## 2019-11-24 NOTE — Telephone Encounter (Signed)
Verbal orders given to Donita, RN with [facility].

## 2019-11-24 NOTE — Telephone Encounter (Signed)
Donita, RN with [facility], calling to request VO for skilled nursing for 1w4. Suzan Slick is also requesting orders for medical social work to see if there is anything that would benefit patient's daughter.    CB#[phone]

## 2019-11-26 ENCOUNTER — Telehealth: Payer: Self-pay

## 2019-11-26 DIAGNOSIS — K7689 Other specified diseases of liver: Secondary | ICD-10-CM | POA: Diagnosis not present

## 2019-11-26 DIAGNOSIS — M1990 Unspecified osteoarthritis, unspecified site: Secondary | ICD-10-CM | POA: Diagnosis not present

## 2019-11-26 DIAGNOSIS — E785 Hyperlipidemia, unspecified: Secondary | ICD-10-CM | POA: Diagnosis not present

## 2019-11-26 DIAGNOSIS — J309 Allergic rhinitis, unspecified: Secondary | ICD-10-CM | POA: Diagnosis not present

## 2019-11-26 DIAGNOSIS — M810 Age-related osteoporosis without current pathological fracture: Secondary | ICD-10-CM | POA: Diagnosis not present

## 2019-11-26 DIAGNOSIS — I129 Hypertensive chronic kidney disease with stage 1 through stage 4 chronic kidney disease, or unspecified chronic kidney disease: Secondary | ICD-10-CM | POA: Diagnosis not present

## 2019-11-26 DIAGNOSIS — D509 Iron deficiency anemia, unspecified: Secondary | ICD-10-CM | POA: Diagnosis not present

## 2019-11-26 DIAGNOSIS — E559 Vitamin D deficiency, unspecified: Secondary | ICD-10-CM | POA: Diagnosis not present

## 2019-11-26 DIAGNOSIS — I4891 Unspecified atrial fibrillation: Secondary | ICD-10-CM | POA: Diagnosis not present

## 2019-11-26 DIAGNOSIS — N183 Chronic kidney disease, stage 3 unspecified: Secondary | ICD-10-CM | POA: Diagnosis not present

## 2019-11-26 DIAGNOSIS — Z7901 Long term (current) use of anticoagulants: Secondary | ICD-10-CM | POA: Diagnosis not present

## 2019-11-26 NOTE — Telephone Encounter (Signed)
Copied from CRM [id number]. Topic: General - Other >> Nov 26, 2019  2:50 PM Geronimo Boot wrote: Home Health called for verbal orders for PT 1W1 2W3. Best contact [phone]

## 2019-11-27 NOTE — Telephone Encounter (Signed)
Left a confidential voicemail for [place] with [facility] giving her verbal orders.

## 2019-11-27 NOTE — Telephone Encounter (Signed)
That's fine

## 2019-11-28 DIAGNOSIS — Z7901 Long term (current) use of anticoagulants: Secondary | ICD-10-CM | POA: Diagnosis not present

## 2019-11-28 DIAGNOSIS — M810 Age-related osteoporosis without current pathological fracture: Secondary | ICD-10-CM | POA: Diagnosis not present

## 2019-11-28 DIAGNOSIS — D509 Iron deficiency anemia, unspecified: Secondary | ICD-10-CM | POA: Diagnosis not present

## 2019-11-28 DIAGNOSIS — M1990 Unspecified osteoarthritis, unspecified site: Secondary | ICD-10-CM | POA: Diagnosis not present

## 2019-11-28 DIAGNOSIS — I129 Hypertensive chronic kidney disease with stage 1 through stage 4 chronic kidney disease, or unspecified chronic kidney disease: Secondary | ICD-10-CM | POA: Diagnosis not present

## 2019-11-28 DIAGNOSIS — E559 Vitamin D deficiency, unspecified: Secondary | ICD-10-CM | POA: Diagnosis not present

## 2019-11-28 DIAGNOSIS — K7689 Other specified diseases of liver: Secondary | ICD-10-CM | POA: Diagnosis not present

## 2019-11-28 DIAGNOSIS — N183 Chronic kidney disease, stage 3 unspecified: Secondary | ICD-10-CM | POA: Diagnosis not present

## 2019-11-28 DIAGNOSIS — E785 Hyperlipidemia, unspecified: Secondary | ICD-10-CM | POA: Diagnosis not present

## 2019-11-28 DIAGNOSIS — I4891 Unspecified atrial fibrillation: Secondary | ICD-10-CM | POA: Diagnosis not present

## 2019-11-28 DIAGNOSIS — J309 Allergic rhinitis, unspecified: Secondary | ICD-10-CM | POA: Diagnosis not present

## 2019-12-01 DIAGNOSIS — I4891 Unspecified atrial fibrillation: Secondary | ICD-10-CM | POA: Diagnosis not present

## 2019-12-01 DIAGNOSIS — D509 Iron deficiency anemia, unspecified: Secondary | ICD-10-CM | POA: Diagnosis not present

## 2019-12-01 DIAGNOSIS — M810 Age-related osteoporosis without current pathological fracture: Secondary | ICD-10-CM | POA: Diagnosis not present

## 2019-12-01 DIAGNOSIS — Z7901 Long term (current) use of anticoagulants: Secondary | ICD-10-CM | POA: Diagnosis not present

## 2019-12-01 DIAGNOSIS — J309 Allergic rhinitis, unspecified: Secondary | ICD-10-CM | POA: Diagnosis not present

## 2019-12-01 DIAGNOSIS — K7689 Other specified diseases of liver: Secondary | ICD-10-CM | POA: Diagnosis not present

## 2019-12-01 DIAGNOSIS — N183 Chronic kidney disease, stage 3 unspecified: Secondary | ICD-10-CM | POA: Diagnosis not present

## 2019-12-01 DIAGNOSIS — I129 Hypertensive chronic kidney disease with stage 1 through stage 4 chronic kidney disease, or unspecified chronic kidney disease: Secondary | ICD-10-CM | POA: Diagnosis not present

## 2019-12-01 DIAGNOSIS — E559 Vitamin D deficiency, unspecified: Secondary | ICD-10-CM | POA: Diagnosis not present

## 2019-12-01 DIAGNOSIS — M1990 Unspecified osteoarthritis, unspecified site: Secondary | ICD-10-CM | POA: Diagnosis not present

## 2019-12-01 DIAGNOSIS — E785 Hyperlipidemia, unspecified: Secondary | ICD-10-CM | POA: Diagnosis not present

## 2019-12-02 DIAGNOSIS — J309 Allergic rhinitis, unspecified: Secondary | ICD-10-CM | POA: Diagnosis not present

## 2019-12-02 DIAGNOSIS — Z7901 Long term (current) use of anticoagulants: Secondary | ICD-10-CM | POA: Diagnosis not present

## 2019-12-02 DIAGNOSIS — D509 Iron deficiency anemia, unspecified: Secondary | ICD-10-CM | POA: Diagnosis not present

## 2019-12-02 DIAGNOSIS — I4891 Unspecified atrial fibrillation: Secondary | ICD-10-CM | POA: Diagnosis not present

## 2019-12-02 DIAGNOSIS — M1990 Unspecified osteoarthritis, unspecified site: Secondary | ICD-10-CM | POA: Diagnosis not present

## 2019-12-02 DIAGNOSIS — M810 Age-related osteoporosis without current pathological fracture: Secondary | ICD-10-CM | POA: Diagnosis not present

## 2019-12-02 DIAGNOSIS — I129 Hypertensive chronic kidney disease with stage 1 through stage 4 chronic kidney disease, or unspecified chronic kidney disease: Secondary | ICD-10-CM | POA: Diagnosis not present

## 2019-12-02 DIAGNOSIS — E559 Vitamin D deficiency, unspecified: Secondary | ICD-10-CM | POA: Diagnosis not present

## 2019-12-02 DIAGNOSIS — K7689 Other specified diseases of liver: Secondary | ICD-10-CM | POA: Diagnosis not present

## 2019-12-02 DIAGNOSIS — N183 Chronic kidney disease, stage 3 unspecified: Secondary | ICD-10-CM | POA: Diagnosis not present

## 2019-12-02 DIAGNOSIS — E785 Hyperlipidemia, unspecified: Secondary | ICD-10-CM | POA: Diagnosis not present

## 2019-12-04 DIAGNOSIS — E559 Vitamin D deficiency, unspecified: Secondary | ICD-10-CM | POA: Diagnosis not present

## 2019-12-04 DIAGNOSIS — M810 Age-related osteoporosis without current pathological fracture: Secondary | ICD-10-CM | POA: Diagnosis not present

## 2019-12-04 DIAGNOSIS — Z7901 Long term (current) use of anticoagulants: Secondary | ICD-10-CM | POA: Diagnosis not present

## 2019-12-04 DIAGNOSIS — K7689 Other specified diseases of liver: Secondary | ICD-10-CM | POA: Diagnosis not present

## 2019-12-04 DIAGNOSIS — I4891 Unspecified atrial fibrillation: Secondary | ICD-10-CM | POA: Diagnosis not present

## 2019-12-04 DIAGNOSIS — E785 Hyperlipidemia, unspecified: Secondary | ICD-10-CM | POA: Diagnosis not present

## 2019-12-04 DIAGNOSIS — D509 Iron deficiency anemia, unspecified: Secondary | ICD-10-CM | POA: Diagnosis not present

## 2019-12-04 DIAGNOSIS — J309 Allergic rhinitis, unspecified: Secondary | ICD-10-CM | POA: Diagnosis not present

## 2019-12-04 DIAGNOSIS — M1990 Unspecified osteoarthritis, unspecified site: Secondary | ICD-10-CM | POA: Diagnosis not present

## 2019-12-04 DIAGNOSIS — N183 Chronic kidney disease, stage 3 unspecified: Secondary | ICD-10-CM | POA: Diagnosis not present

## 2019-12-04 DIAGNOSIS — I129 Hypertensive chronic kidney disease with stage 1 through stage 4 chronic kidney disease, or unspecified chronic kidney disease: Secondary | ICD-10-CM | POA: Diagnosis not present

## 2019-12-05 DIAGNOSIS — J309 Allergic rhinitis, unspecified: Secondary | ICD-10-CM | POA: Diagnosis not present

## 2019-12-05 DIAGNOSIS — M810 Age-related osteoporosis without current pathological fracture: Secondary | ICD-10-CM | POA: Diagnosis not present

## 2019-12-05 DIAGNOSIS — D509 Iron deficiency anemia, unspecified: Secondary | ICD-10-CM | POA: Diagnosis not present

## 2019-12-05 DIAGNOSIS — N183 Chronic kidney disease, stage 3 unspecified: Secondary | ICD-10-CM | POA: Diagnosis not present

## 2019-12-05 DIAGNOSIS — E559 Vitamin D deficiency, unspecified: Secondary | ICD-10-CM | POA: Diagnosis not present

## 2019-12-05 DIAGNOSIS — I4891 Unspecified atrial fibrillation: Secondary | ICD-10-CM | POA: Diagnosis not present

## 2019-12-05 DIAGNOSIS — Z7901 Long term (current) use of anticoagulants: Secondary | ICD-10-CM | POA: Diagnosis not present

## 2019-12-05 DIAGNOSIS — M1990 Unspecified osteoarthritis, unspecified site: Secondary | ICD-10-CM | POA: Diagnosis not present

## 2019-12-05 DIAGNOSIS — I129 Hypertensive chronic kidney disease with stage 1 through stage 4 chronic kidney disease, or unspecified chronic kidney disease: Secondary | ICD-10-CM | POA: Diagnosis not present

## 2019-12-05 DIAGNOSIS — K7689 Other specified diseases of liver: Secondary | ICD-10-CM | POA: Diagnosis not present

## 2019-12-05 DIAGNOSIS — E785 Hyperlipidemia, unspecified: Secondary | ICD-10-CM | POA: Diagnosis not present

## 2019-12-09 DIAGNOSIS — N183 Chronic kidney disease, stage 3 unspecified: Secondary | ICD-10-CM | POA: Diagnosis not present

## 2019-12-09 DIAGNOSIS — K7689 Other specified diseases of liver: Secondary | ICD-10-CM | POA: Diagnosis not present

## 2019-12-09 DIAGNOSIS — I129 Hypertensive chronic kidney disease with stage 1 through stage 4 chronic kidney disease, or unspecified chronic kidney disease: Secondary | ICD-10-CM | POA: Diagnosis not present

## 2019-12-09 DIAGNOSIS — E559 Vitamin D deficiency, unspecified: Secondary | ICD-10-CM | POA: Diagnosis not present

## 2019-12-09 DIAGNOSIS — I4891 Unspecified atrial fibrillation: Secondary | ICD-10-CM | POA: Diagnosis not present

## 2019-12-09 DIAGNOSIS — M1990 Unspecified osteoarthritis, unspecified site: Secondary | ICD-10-CM | POA: Diagnosis not present

## 2019-12-09 DIAGNOSIS — M810 Age-related osteoporosis without current pathological fracture: Secondary | ICD-10-CM | POA: Diagnosis not present

## 2019-12-09 DIAGNOSIS — E785 Hyperlipidemia, unspecified: Secondary | ICD-10-CM | POA: Diagnosis not present

## 2019-12-09 DIAGNOSIS — Z7901 Long term (current) use of anticoagulants: Secondary | ICD-10-CM | POA: Diagnosis not present

## 2019-12-09 DIAGNOSIS — D509 Iron deficiency anemia, unspecified: Secondary | ICD-10-CM | POA: Diagnosis not present

## 2019-12-09 DIAGNOSIS — J309 Allergic rhinitis, unspecified: Secondary | ICD-10-CM | POA: Diagnosis not present

## 2019-12-12 DIAGNOSIS — E559 Vitamin D deficiency, unspecified: Secondary | ICD-10-CM | POA: Diagnosis not present

## 2019-12-12 DIAGNOSIS — J309 Allergic rhinitis, unspecified: Secondary | ICD-10-CM | POA: Diagnosis not present

## 2019-12-12 DIAGNOSIS — E785 Hyperlipidemia, unspecified: Secondary | ICD-10-CM | POA: Diagnosis not present

## 2019-12-12 DIAGNOSIS — D509 Iron deficiency anemia, unspecified: Secondary | ICD-10-CM | POA: Diagnosis not present

## 2019-12-12 DIAGNOSIS — M1990 Unspecified osteoarthritis, unspecified site: Secondary | ICD-10-CM | POA: Diagnosis not present

## 2019-12-12 DIAGNOSIS — I129 Hypertensive chronic kidney disease with stage 1 through stage 4 chronic kidney disease, or unspecified chronic kidney disease: Secondary | ICD-10-CM | POA: Diagnosis not present

## 2019-12-12 DIAGNOSIS — K7689 Other specified diseases of liver: Secondary | ICD-10-CM | POA: Diagnosis not present

## 2019-12-12 DIAGNOSIS — I4891 Unspecified atrial fibrillation: Secondary | ICD-10-CM | POA: Diagnosis not present

## 2019-12-12 DIAGNOSIS — M810 Age-related osteoporosis without current pathological fracture: Secondary | ICD-10-CM | POA: Diagnosis not present

## 2019-12-12 DIAGNOSIS — Z7901 Long term (current) use of anticoagulants: Secondary | ICD-10-CM | POA: Diagnosis not present

## 2019-12-12 DIAGNOSIS — N183 Chronic kidney disease, stage 3 unspecified: Secondary | ICD-10-CM | POA: Diagnosis not present

## 2019-12-16 ENCOUNTER — Other Ambulatory Visit: Payer: Self-pay

## 2019-12-16 ENCOUNTER — Encounter: Payer: Self-pay | Admitting: Family Medicine

## 2019-12-16 ENCOUNTER — Ambulatory Visit (INDEPENDENT_AMBULATORY_CARE_PROVIDER_SITE_OTHER): Payer: Medicare Other | Admitting: Family Medicine

## 2019-12-16 VITALS — BP 152/71 | HR 82 | Temp 97.3°F | Ht 63.0 in | Wt 132.0 lb

## 2019-12-16 DIAGNOSIS — J309 Allergic rhinitis, unspecified: Secondary | ICD-10-CM | POA: Diagnosis not present

## 2019-12-16 DIAGNOSIS — F0390 Unspecified dementia without behavioral disturbance: Secondary | ICD-10-CM | POA: Diagnosis not present

## 2019-12-16 DIAGNOSIS — I129 Hypertensive chronic kidney disease with stage 1 through stage 4 chronic kidney disease, or unspecified chronic kidney disease: Secondary | ICD-10-CM | POA: Diagnosis not present

## 2019-12-16 DIAGNOSIS — K7689 Other specified diseases of liver: Secondary | ICD-10-CM | POA: Diagnosis not present

## 2019-12-16 DIAGNOSIS — F331 Major depressive disorder, recurrent, moderate: Secondary | ICD-10-CM | POA: Diagnosis not present

## 2019-12-16 DIAGNOSIS — E785 Hyperlipidemia, unspecified: Secondary | ICD-10-CM | POA: Diagnosis not present

## 2019-12-16 DIAGNOSIS — M810 Age-related osteoporosis without current pathological fracture: Secondary | ICD-10-CM | POA: Diagnosis not present

## 2019-12-16 DIAGNOSIS — N183 Chronic kidney disease, stage 3 unspecified: Secondary | ICD-10-CM | POA: Diagnosis not present

## 2019-12-16 DIAGNOSIS — E559 Vitamin D deficiency, unspecified: Secondary | ICD-10-CM | POA: Diagnosis not present

## 2019-12-16 DIAGNOSIS — M1990 Unspecified osteoarthritis, unspecified site: Secondary | ICD-10-CM | POA: Diagnosis not present

## 2019-12-16 DIAGNOSIS — D509 Iron deficiency anemia, unspecified: Secondary | ICD-10-CM | POA: Diagnosis not present

## 2019-12-16 DIAGNOSIS — I4891 Unspecified atrial fibrillation: Secondary | ICD-10-CM | POA: Diagnosis not present

## 2019-12-16 DIAGNOSIS — Z7901 Long term (current) use of anticoagulants: Secondary | ICD-10-CM | POA: Diagnosis not present

## 2019-12-16 NOTE — Progress Notes (Signed)
Established patient visit   Patient: Tami [name]   DOB: 1924/04/23   86 y.o. Female  MRN: [id number] Visit Date: 12/16/2019  Today's healthcare provider: Mila Merry, MD   Chief Complaint  Patient presents with  . Depression  . Dementia   I,Latasha Walston,acting as a scribe for Mila Merry, MD.,have documented all relevant documentation on the behalf of Mila Merry, MD,as directed by  Mila Merry, MD while in the presence of Mila Merry, MD.  Subjective    HPI  Follow up for Depression:  The patient was last seen for this 2 months ago. Changes made at last visit include started Prozac 10 mg daily.  She reports fair compliance with treatment. Patient took medication for a little while and had to discontinue due to increase confusion.  She feels that condition is Unchanged. She is not having side effects.   Her daughter states she sleeps well and appetite is good.  ----------------------------------------------------------------------------------------- Follow up for Dementia:  The patient was last seen for this 2 months ago. Changes made at last visit include no changes. Will see how patient does on SSRI may add Donepezil..  She reports good compliance with treatment. She feels that condition is Unchanged. She is not having side effects.   ----------------------------------------------------------------------------------------- Since her last visit she has started on home PT for leg strengthening, gait and balance training and feels she is doing well. Patient and her daughter have been very happy with PT and feels she is getting around better and more active.      Medications: Outpatient Medications Prior to Visit  Medication Sig  . acetaminophen (TYLENOL) 325 MG tablet Take 1-2 tablets (325-650 mg total) by mouth every 6 (six) hours as needed for mild pain (pain score 1-3 or temp > 100.5).  Marland Kitchen acetaminophen (TYLENOL) 500 MG tablet Take 500 mg by  mouth at bedtime.  . benazepril (LOTENSIN) 10 MG tablet TAKE 1 TABLET BY MOUTH EVERY DAY  . Cholecalciferol (VITAMIN D3) 2000 UNITS CHEW Chew 1 tablet by mouth daily.  Marland Kitchen loratadine (CLARITIN) 10 MG tablet Take by mouth daily.   . polyethylene glycol (MIRALAX / GLYCOLAX) packet Take 17 g by mouth daily as needed for mild constipation. (Patient taking differently: Take 17 g by mouth daily. )  . verapamil (CALAN-SR) 240 MG CR tablet Take 1 tablet (240 mg total) by mouth daily.  Carlena Hurl 15 MG TABS tablet TAKE 1 TABLET BY MOUTH DAILY WITH SUPPER  . [DISCONTINUED] FLUoxetine (PROZAC) 10 MG capsule Take 1 capsule (10 mg total) by mouth daily.  . [DISCONTINUED] valACYclovir (VALTREX) 1000 MG tablet Take 1 tablet (1,000 mg total) by mouth 2 (two) times daily. (Patient not taking: Reported on 10/07/2019)   No facility-administered medications prior to visit.    Review of Systems  Constitutional: Negative.   Respiratory: Negative.   Cardiovascular: Negative.   Musculoskeletal: Negative.   Psychiatric/Behavioral: Positive for confusion and dysphoric mood.      Objective    BP (!) 152/71 (BP Location: Right Arm, Patient Position: Sitting, Cuff Size: Normal)   Pulse 82   Temp (!) 97.3 F (36.3 C) (Temporal)   Ht 5\' 3"  (1.6 m)   Wt 132 lb (59.9 kg)   BMI 23.38 kg/m    Physical Exam   General: Appearance:    Well developed, well nourished female in no acute distress  Eyes:    PERRL, conjunctiva/corneas clear, EOM's intact       Lungs:  Clear to auscultation bilaterally, respirations unlabored  Heart:    Normal heart rate. Irregularly irregular rhythm. No murmurs, rubs, or gallops.   MS:   All extremities are intact.   Neurologic:   Awake, alert, oriented x 2. No apparent focal neurological           defect.        No results found for any visits on 12/16/19.  Assessment & Plan     1. Moderate episode of recurrent major depressive disorder (HCC) Stopped trial of fluoxetine due to  worseneing confusion. However she has been more active and in better spirits since starting physical therapy. She and her daughter due not feel any new medications are needed at this point. She will need to follow up for a-fib ang BP in the fall and will re-assess benefit versus risk of other antidepressants such as an SNRI at that point.   2. Dementia without behavioral disturbance, unspecified dementia type (HCC) Stable. Off fluoxetine as above. Reassess at follow up in about 3 months.    No follow-ups on file.         Mila Merry, MD  [facility] [phone] (phone) [phone] (fax)  [facility]

## 2019-12-17 DIAGNOSIS — D509 Iron deficiency anemia, unspecified: Secondary | ICD-10-CM | POA: Diagnosis not present

## 2019-12-17 DIAGNOSIS — N183 Chronic kidney disease, stage 3 unspecified: Secondary | ICD-10-CM | POA: Diagnosis not present

## 2019-12-17 DIAGNOSIS — J309 Allergic rhinitis, unspecified: Secondary | ICD-10-CM | POA: Diagnosis not present

## 2019-12-17 DIAGNOSIS — M810 Age-related osteoporosis without current pathological fracture: Secondary | ICD-10-CM | POA: Diagnosis not present

## 2019-12-17 DIAGNOSIS — K7689 Other specified diseases of liver: Secondary | ICD-10-CM | POA: Diagnosis not present

## 2019-12-17 DIAGNOSIS — M1990 Unspecified osteoarthritis, unspecified site: Secondary | ICD-10-CM | POA: Diagnosis not present

## 2019-12-17 DIAGNOSIS — I129 Hypertensive chronic kidney disease with stage 1 through stage 4 chronic kidney disease, or unspecified chronic kidney disease: Secondary | ICD-10-CM | POA: Diagnosis not present

## 2019-12-17 DIAGNOSIS — Z7901 Long term (current) use of anticoagulants: Secondary | ICD-10-CM | POA: Diagnosis not present

## 2019-12-17 DIAGNOSIS — I4891 Unspecified atrial fibrillation: Secondary | ICD-10-CM | POA: Diagnosis not present

## 2019-12-17 DIAGNOSIS — E559 Vitamin D deficiency, unspecified: Secondary | ICD-10-CM | POA: Diagnosis not present

## 2019-12-17 DIAGNOSIS — E785 Hyperlipidemia, unspecified: Secondary | ICD-10-CM | POA: Diagnosis not present

## 2019-12-19 DIAGNOSIS — E559 Vitamin D deficiency, unspecified: Secondary | ICD-10-CM | POA: Diagnosis not present

## 2019-12-19 DIAGNOSIS — Z7901 Long term (current) use of anticoagulants: Secondary | ICD-10-CM | POA: Diagnosis not present

## 2019-12-19 DIAGNOSIS — K7689 Other specified diseases of liver: Secondary | ICD-10-CM | POA: Diagnosis not present

## 2019-12-19 DIAGNOSIS — I129 Hypertensive chronic kidney disease with stage 1 through stage 4 chronic kidney disease, or unspecified chronic kidney disease: Secondary | ICD-10-CM | POA: Diagnosis not present

## 2019-12-19 DIAGNOSIS — E785 Hyperlipidemia, unspecified: Secondary | ICD-10-CM | POA: Diagnosis not present

## 2019-12-19 DIAGNOSIS — I4891 Unspecified atrial fibrillation: Secondary | ICD-10-CM | POA: Diagnosis not present

## 2019-12-19 DIAGNOSIS — M1990 Unspecified osteoarthritis, unspecified site: Secondary | ICD-10-CM | POA: Diagnosis not present

## 2019-12-19 DIAGNOSIS — D509 Iron deficiency anemia, unspecified: Secondary | ICD-10-CM | POA: Diagnosis not present

## 2019-12-19 DIAGNOSIS — J309 Allergic rhinitis, unspecified: Secondary | ICD-10-CM | POA: Diagnosis not present

## 2019-12-19 DIAGNOSIS — N183 Chronic kidney disease, stage 3 unspecified: Secondary | ICD-10-CM | POA: Diagnosis not present

## 2019-12-19 DIAGNOSIS — M810 Age-related osteoporosis without current pathological fracture: Secondary | ICD-10-CM | POA: Diagnosis not present

## 2019-12-23 ENCOUNTER — Telehealth: Payer: Self-pay

## 2019-12-23 NOTE — Telephone Encounter (Signed)
That's fine

## 2019-12-23 NOTE — Telephone Encounter (Signed)
Tami [name] with [facility] is requesting verbal orders for in home physical therapy to continue as follows:  1W4  Please advise. Thanks TNP

## 2019-12-23 NOTE — Telephone Encounter (Signed)
Left detailed message for Tami [name].

## 2019-12-24 ENCOUNTER — Other Ambulatory Visit: Payer: Self-pay | Admitting: Family Medicine

## 2019-12-24 NOTE — Telephone Encounter (Signed)
Requested medications are due for refill today?  Yes   Requested medications are on active medication list?  Yes   Last Refill:  07/27/2019  # 90 with one refill     Future visit scheduled?  Yes in two months.    Notes to Clinic:  Medication failed RX refill protocol due to no labs within the past 180 / 360 days.  Last labs were performed on 12/16/2018.

## 2019-12-25 DIAGNOSIS — I129 Hypertensive chronic kidney disease with stage 1 through stage 4 chronic kidney disease, or unspecified chronic kidney disease: Secondary | ICD-10-CM | POA: Diagnosis not present

## 2019-12-25 DIAGNOSIS — K7689 Other specified diseases of liver: Secondary | ICD-10-CM | POA: Diagnosis not present

## 2019-12-25 DIAGNOSIS — M810 Age-related osteoporosis without current pathological fracture: Secondary | ICD-10-CM | POA: Diagnosis not present

## 2019-12-25 DIAGNOSIS — D509 Iron deficiency anemia, unspecified: Secondary | ICD-10-CM | POA: Diagnosis not present

## 2019-12-25 DIAGNOSIS — E559 Vitamin D deficiency, unspecified: Secondary | ICD-10-CM | POA: Diagnosis not present

## 2019-12-25 DIAGNOSIS — Z7901 Long term (current) use of anticoagulants: Secondary | ICD-10-CM | POA: Diagnosis not present

## 2019-12-25 DIAGNOSIS — E785 Hyperlipidemia, unspecified: Secondary | ICD-10-CM | POA: Diagnosis not present

## 2019-12-25 DIAGNOSIS — N183 Chronic kidney disease, stage 3 unspecified: Secondary | ICD-10-CM | POA: Diagnosis not present

## 2019-12-25 DIAGNOSIS — M1990 Unspecified osteoarthritis, unspecified site: Secondary | ICD-10-CM | POA: Diagnosis not present

## 2019-12-25 DIAGNOSIS — I4891 Unspecified atrial fibrillation: Secondary | ICD-10-CM | POA: Diagnosis not present

## 2019-12-25 DIAGNOSIS — J309 Allergic rhinitis, unspecified: Secondary | ICD-10-CM | POA: Diagnosis not present

## 2020-01-01 DIAGNOSIS — M1990 Unspecified osteoarthritis, unspecified site: Secondary | ICD-10-CM | POA: Diagnosis not present

## 2020-01-01 DIAGNOSIS — D509 Iron deficiency anemia, unspecified: Secondary | ICD-10-CM | POA: Diagnosis not present

## 2020-01-01 DIAGNOSIS — K7689 Other specified diseases of liver: Secondary | ICD-10-CM | POA: Diagnosis not present

## 2020-01-01 DIAGNOSIS — J309 Allergic rhinitis, unspecified: Secondary | ICD-10-CM | POA: Diagnosis not present

## 2020-01-01 DIAGNOSIS — E559 Vitamin D deficiency, unspecified: Secondary | ICD-10-CM | POA: Diagnosis not present

## 2020-01-01 DIAGNOSIS — Z7901 Long term (current) use of anticoagulants: Secondary | ICD-10-CM | POA: Diagnosis not present

## 2020-01-01 DIAGNOSIS — I4891 Unspecified atrial fibrillation: Secondary | ICD-10-CM | POA: Diagnosis not present

## 2020-01-01 DIAGNOSIS — I129 Hypertensive chronic kidney disease with stage 1 through stage 4 chronic kidney disease, or unspecified chronic kidney disease: Secondary | ICD-10-CM | POA: Diagnosis not present

## 2020-01-01 DIAGNOSIS — M810 Age-related osteoporosis without current pathological fracture: Secondary | ICD-10-CM | POA: Diagnosis not present

## 2020-01-01 DIAGNOSIS — N183 Chronic kidney disease, stage 3 unspecified: Secondary | ICD-10-CM | POA: Diagnosis not present

## 2020-01-01 DIAGNOSIS — E785 Hyperlipidemia, unspecified: Secondary | ICD-10-CM | POA: Diagnosis not present

## 2020-01-05 ENCOUNTER — Other Ambulatory Visit: Payer: Self-pay | Admitting: Family Medicine

## 2020-01-07 ENCOUNTER — Other Ambulatory Visit: Payer: Self-pay | Admitting: Family Medicine

## 2020-01-07 DIAGNOSIS — M810 Age-related osteoporosis without current pathological fracture: Secondary | ICD-10-CM | POA: Diagnosis not present

## 2020-01-07 DIAGNOSIS — E785 Hyperlipidemia, unspecified: Secondary | ICD-10-CM | POA: Diagnosis not present

## 2020-01-07 DIAGNOSIS — D509 Iron deficiency anemia, unspecified: Secondary | ICD-10-CM | POA: Diagnosis not present

## 2020-01-07 DIAGNOSIS — M1990 Unspecified osteoarthritis, unspecified site: Secondary | ICD-10-CM | POA: Diagnosis not present

## 2020-01-07 DIAGNOSIS — I4891 Unspecified atrial fibrillation: Secondary | ICD-10-CM | POA: Diagnosis not present

## 2020-01-07 DIAGNOSIS — K7689 Other specified diseases of liver: Secondary | ICD-10-CM | POA: Diagnosis not present

## 2020-01-07 DIAGNOSIS — N183 Chronic kidney disease, stage 3 unspecified: Secondary | ICD-10-CM | POA: Diagnosis not present

## 2020-01-07 DIAGNOSIS — Z7901 Long term (current) use of anticoagulants: Secondary | ICD-10-CM | POA: Diagnosis not present

## 2020-01-07 DIAGNOSIS — E559 Vitamin D deficiency, unspecified: Secondary | ICD-10-CM | POA: Diagnosis not present

## 2020-01-07 DIAGNOSIS — J309 Allergic rhinitis, unspecified: Secondary | ICD-10-CM | POA: Diagnosis not present

## 2020-01-07 DIAGNOSIS — I129 Hypertensive chronic kidney disease with stage 1 through stage 4 chronic kidney disease, or unspecified chronic kidney disease: Secondary | ICD-10-CM | POA: Diagnosis not present

## 2020-01-07 NOTE — Telephone Encounter (Signed)
Pt daughter rose is  Calling  and her mother last refill was 10/10/2019 #90 with no refills. Pt needs a refill on xarelto. [facility] [address] in [place]

## 2020-01-09 ENCOUNTER — Telehealth: Payer: Self-pay

## 2020-01-09 NOTE — Telephone Encounter (Signed)
Pts daughter called stating that the pharmacys phones have been messed up. They are requesting to have the request faxed again. Please advise.

## 2020-01-09 NOTE — Telephone Encounter (Signed)
I called pharmacy and was advised that they did not receive the prescription that was sent in on 12/24/2019. I verbally called prescription in today while on the phone with pharmacist. Patients daughter Okey Dupre advised.

## 2020-01-09 NOTE — Telephone Encounter (Signed)
Copied from CRM [id number]. Topic: General - Other >> Jan 08, 2020  3:00 PM Daphine Deutscher D wrote: Reason for CRM: Pt's daughter Okey Dupre called saying she has had a problem with getting her mom's xalreto.  She can't get in touch with the pharmacy but it does say that the medication has been discontinued by Dr. Sherrie Mustache   CB#  [phone]

## 2020-01-12 DIAGNOSIS — E559 Vitamin D deficiency, unspecified: Secondary | ICD-10-CM | POA: Diagnosis not present

## 2020-01-12 DIAGNOSIS — M810 Age-related osteoporosis without current pathological fracture: Secondary | ICD-10-CM | POA: Diagnosis not present

## 2020-01-12 DIAGNOSIS — K7689 Other specified diseases of liver: Secondary | ICD-10-CM | POA: Diagnosis not present

## 2020-01-12 DIAGNOSIS — M1990 Unspecified osteoarthritis, unspecified site: Secondary | ICD-10-CM | POA: Diagnosis not present

## 2020-01-12 DIAGNOSIS — D509 Iron deficiency anemia, unspecified: Secondary | ICD-10-CM | POA: Diagnosis not present

## 2020-01-12 DIAGNOSIS — I4891 Unspecified atrial fibrillation: Secondary | ICD-10-CM | POA: Diagnosis not present

## 2020-01-12 DIAGNOSIS — N183 Chronic kidney disease, stage 3 unspecified: Secondary | ICD-10-CM | POA: Diagnosis not present

## 2020-01-12 DIAGNOSIS — E785 Hyperlipidemia, unspecified: Secondary | ICD-10-CM | POA: Diagnosis not present

## 2020-01-12 DIAGNOSIS — I129 Hypertensive chronic kidney disease with stage 1 through stage 4 chronic kidney disease, or unspecified chronic kidney disease: Secondary | ICD-10-CM | POA: Diagnosis not present

## 2020-01-12 DIAGNOSIS — J309 Allergic rhinitis, unspecified: Secondary | ICD-10-CM | POA: Diagnosis not present

## 2020-01-12 DIAGNOSIS — Z7901 Long term (current) use of anticoagulants: Secondary | ICD-10-CM | POA: Diagnosis not present

## 2020-01-12 NOTE — Telephone Encounter (Addendum)
Caller name: Okey Dupre  Relation to pt: daughter  Call back number: [phone]  Pharmacy  [id number] - [place], [place] - [address] AT [facility] [address] Phone:  [phone]  Fax:  [phone]        Reason for call:  Okey Dupre (patient daughter) states 90 day supply of  XARELTO 15 MG TABS tablet cost $400. Daughter would like to know if PCP has samples and would like a 30 day supply called in due to supply costing $100 plus. Caller would like a follow up, please advise

## 2020-01-12 NOTE — Telephone Encounter (Signed)
She can have a months samples if we have them.

## 2020-01-12 NOTE — Telephone Encounter (Signed)
Have 2 samples available. Put at the front desk for pick up. Patient's daughter Okey Dupre advised.

## 2020-01-26 NOTE — Telephone Encounter (Signed)
[name] May called to say that patient started on her second bottle of Xarelto 15 MG  and they were informed to call back and check for samples. Please advise can be reached at Ph# [phone]

## 2020-01-27 NOTE — Telephone Encounter (Signed)
Advised patient's daughter that we do not have any samples of Xarelto 15mg  at this time.   She also mentions that the patient has had cough and chest congestion X 3 days. She has taken Robitussin and Mucinex with no relief. Denies fevers. She is requesting an abx to be sent in to help "break the mucus" in her chest. Please advise. Thanks!

## 2020-01-28 MED ORDER — AZITHROMYCIN 250 MG PO TABS: ORAL_TABLET | ORAL | 0 refills | Status: DC

## 2020-01-28 NOTE — Addendum Note (Signed)
Addended by: Malva Limes on: 01/28/2020 01:26 PM   Modules accepted: Orders

## 2020-01-28 NOTE — Telephone Encounter (Signed)
Patient's daughter Okey Dupre May advised.

## 2020-01-28 NOTE — Telephone Encounter (Signed)
Azithromycin prescription sent to her pharmacy

## 2020-02-01 ENCOUNTER — Other Ambulatory Visit: Payer: Self-pay

## 2020-02-01 ENCOUNTER — Inpatient Hospital Stay
Admission: EM | Admit: 2020-02-01 | Discharge: 2020-02-05 | DRG: 177 | Disposition: A | Discharge status: Home Health | Payer: Medicare Other | Attending: Internal Medicine | Admitting: Internal Medicine

## 2020-02-01 ENCOUNTER — Emergency Department: Payer: Medicare Other

## 2020-02-01 DIAGNOSIS — I4891 Unspecified atrial fibrillation: Secondary | ICD-10-CM | POA: Diagnosis present

## 2020-02-01 DIAGNOSIS — M1990 Unspecified osteoarthritis, unspecified site: Secondary | ICD-10-CM | POA: Diagnosis present

## 2020-02-01 DIAGNOSIS — Z79899 Other long term (current) drug therapy: Secondary | ICD-10-CM

## 2020-02-01 DIAGNOSIS — I482 Chronic atrial fibrillation, unspecified: Secondary | ICD-10-CM | POA: Diagnosis not present

## 2020-02-01 DIAGNOSIS — H547 Unspecified visual loss: Secondary | ICD-10-CM | POA: Diagnosis present

## 2020-02-01 DIAGNOSIS — Z888 Allergy status to other drugs, medicaments and biological substances status: Secondary | ICD-10-CM | POA: Diagnosis not present

## 2020-02-01 DIAGNOSIS — M810 Age-related osteoporosis without current pathological fracture: Secondary | ICD-10-CM | POA: Diagnosis present

## 2020-02-01 DIAGNOSIS — E871 Hypo-osmolality and hyponatremia: Secondary | ICD-10-CM | POA: Diagnosis not present

## 2020-02-01 DIAGNOSIS — D696 Thrombocytopenia, unspecified: Secondary | ICD-10-CM | POA: Diagnosis present

## 2020-02-01 DIAGNOSIS — I4821 Permanent atrial fibrillation: Secondary | ICD-10-CM | POA: Diagnosis present

## 2020-02-01 DIAGNOSIS — Z8249 Family history of ischemic heart disease and other diseases of the circulatory system: Secondary | ICD-10-CM | POA: Diagnosis not present

## 2020-02-01 DIAGNOSIS — J1282 Pneumonia due to coronavirus disease 2019: Secondary | ICD-10-CM | POA: Diagnosis not present

## 2020-02-01 DIAGNOSIS — H9190 Unspecified hearing loss, unspecified ear: Secondary | ICD-10-CM | POA: Diagnosis present

## 2020-02-01 DIAGNOSIS — E785 Hyperlipidemia, unspecified: Secondary | ICD-10-CM | POA: Diagnosis present

## 2020-02-01 DIAGNOSIS — J9601 Acute respiratory failure with hypoxia: Secondary | ICD-10-CM

## 2020-02-01 DIAGNOSIS — D6869 Other thrombophilia: Secondary | ICD-10-CM | POA: Diagnosis not present

## 2020-02-01 DIAGNOSIS — I10 Essential (primary) hypertension: Secondary | ICD-10-CM

## 2020-02-01 DIAGNOSIS — D539 Nutritional anemia, unspecified: Secondary | ICD-10-CM | POA: Diagnosis present

## 2020-02-01 DIAGNOSIS — N1831 Chronic kidney disease, stage 3a: Secondary | ICD-10-CM | POA: Diagnosis present

## 2020-02-01 DIAGNOSIS — E559 Vitamin D deficiency, unspecified: Secondary | ICD-10-CM | POA: Diagnosis present

## 2020-02-01 DIAGNOSIS — N183 Chronic kidney disease, stage 3 unspecified: Secondary | ICD-10-CM | POA: Diagnosis present

## 2020-02-01 DIAGNOSIS — Z882 Allergy status to sulfonamides status: Secondary | ICD-10-CM

## 2020-02-01 DIAGNOSIS — R531 Weakness: Secondary | ICD-10-CM

## 2020-02-01 DIAGNOSIS — J9600 Acute respiratory failure, unspecified whether with hypoxia or hypercapnia: Secondary | ICD-10-CM | POA: Diagnosis not present

## 2020-02-01 DIAGNOSIS — Z7901 Long term (current) use of anticoagulants: Secondary | ICD-10-CM

## 2020-02-01 DIAGNOSIS — I517 Cardiomegaly: Secondary | ICD-10-CM | POA: Diagnosis not present

## 2020-02-01 DIAGNOSIS — R05 Cough: Secondary | ICD-10-CM | POA: Diagnosis not present

## 2020-02-01 DIAGNOSIS — U071 COVID-19: Secondary | ICD-10-CM | POA: Diagnosis not present

## 2020-02-01 DIAGNOSIS — Z66 Do not resuscitate: Secondary | ICD-10-CM | POA: Diagnosis present

## 2020-02-01 DIAGNOSIS — I129 Hypertensive chronic kidney disease with stage 1 through stage 4 chronic kidney disease, or unspecified chronic kidney disease: Secondary | ICD-10-CM | POA: Diagnosis present

## 2020-02-01 DIAGNOSIS — E870 Hyperosmolality and hypernatremia: Secondary | ICD-10-CM | POA: Diagnosis not present

## 2020-02-01 LAB — BASIC METABOLIC PANEL
Anion gap: 12 (ref 5–15)
BUN: 25 mg/dL — ABNORMAL HIGH (ref 8–23)
CO2: 23 mmol/L (ref 22–32)
Calcium: 8.2 mg/dL — ABNORMAL LOW (ref 8.9–10.3)
Chloride: 92 mmol/L — ABNORMAL LOW (ref 98–111)
Creatinine, Ser: 1.09 mg/dL — ABNORMAL HIGH (ref 0.44–1.00)
GFR calc Af Amer: 50 mL/min — ABNORMAL LOW (ref >60)
GFR calc non Af Amer: 43 mL/min — ABNORMAL LOW (ref >60)
Glucose, Bld: 115 mg/dL — ABNORMAL HIGH (ref 70–99)
Potassium: 3.7 mmol/L (ref 3.5–5.1)
Sodium: 127 mmol/L — ABNORMAL LOW (ref 135–145)

## 2020-02-01 LAB — CBC WITH DIFFERENTIAL/PLATELET
Abs Immature Granulocytes: 0.02 10*3/uL (ref 0.00–0.07)
Basophils Absolute: 0 10*3/uL (ref 0.0–0.1)
Basophils Relative: 0 %
Eosinophils Absolute: 0 10*3/uL (ref 0.0–0.5)
Eosinophils Relative: 0 %
HCT: 32.3 % — ABNORMAL LOW (ref 36.0–46.0)
Hemoglobin: 11 g/dL — ABNORMAL LOW (ref 12.0–15.0)
Immature Granulocytes: 1 %
Lymphocytes Relative: 18 %
Lymphs Abs: 0.8 10*3/uL (ref 0.7–4.0)
MCH: 34.5 pg — ABNORMAL HIGH (ref 26.0–34.0)
MCHC: 34.1 g/dL (ref 30.0–36.0)
MCV: 101.3 fL — ABNORMAL HIGH (ref 80.0–100.0)
Monocytes Absolute: 0.6 10*3/uL (ref 0.1–1.0)
Monocytes Relative: 13 %
Neutro Abs: 3 10*3/uL (ref 1.7–7.7)
Neutrophils Relative %: 68 %
Platelets: 98 10*3/uL — ABNORMAL LOW (ref 150–400)
RBC: 3.19 MIL/uL — ABNORMAL LOW (ref 3.87–5.11)
RDW: 12 % (ref 11.5–15.5)
WBC: 4.4 10*3/uL (ref 4.0–10.5)
nRBC: 0 % (ref 0.0–0.2)

## 2020-02-01 LAB — URINALYSIS, COMPLETE (UACMP) WITH MICROSCOPIC
Bilirubin Urine: NEGATIVE
Glucose, UA: NEGATIVE mg/dL
Ketones, ur: 5 mg/dL — AB
Nitrite: NEGATIVE
Protein, ur: NEGATIVE mg/dL
Specific Gravity, Urine: 1.01 (ref 1.005–1.030)
pH: 5 (ref 5.0–8.0)

## 2020-02-01 LAB — ABO/RH: ABO/RH(D): B POS

## 2020-02-01 LAB — FIBRIN DERIVATIVES D-DIMER (ARMC ONLY): Fibrin derivatives D-dimer (ARMC): 771.65 ng/mL (FEU) — ABNORMAL HIGH (ref 0.00–499.00)

## 2020-02-01 LAB — SARS CORONAVIRUS 2 BY RT PCR (HOSPITAL ORDER, PERFORMED IN ~~LOC~~ HOSPITAL LAB): SARS Coronavirus 2: POSITIVE — AB

## 2020-02-01 MED ORDER — ASCORBIC ACID 500 MG PO TABS
500.0000 mg | ORAL_TABLET | Freq: Every day | ORAL | Status: DC
  Administered 2020-02-01 – 2020-02-05 (×5): 500 mg via ORAL
  Filled 2020-02-01 (×5): qty 1

## 2020-02-01 MED ORDER — ZINC SULFATE 220 (50 ZN) MG PO CAPS
220.0000 mg | ORAL_CAPSULE | Freq: Every day | ORAL | Status: DC
  Administered 2020-02-01 – 2020-02-05 (×5): 220 mg via ORAL
  Filled 2020-02-01 (×5): qty 1

## 2020-02-01 MED ORDER — BENZONATATE 100 MG PO CAPS
200.0000 mg | ORAL_CAPSULE | Freq: Once | ORAL | Status: AC
  Administered 2020-02-01: 200 mg via ORAL
  Filled 2020-02-01: qty 2

## 2020-02-01 MED ORDER — GUAIFENESIN-DM 100-10 MG/5ML PO SYRP
10.0000 mL | ORAL_SOLUTION | ORAL | Status: DC | PRN
  Administered 2020-02-01 – 2020-02-04 (×2): 10 mL via ORAL
  Filled 2020-02-01 (×3): qty 10

## 2020-02-01 MED ORDER — METHYLPREDNISOLONE SODIUM SUCC 40 MG IJ SOLR
0.5000 mg/kg | Freq: Two times a day (BID) | INTRAMUSCULAR | Status: DC
  Administered 2020-02-01 – 2020-02-03 (×4): 27.2 mg via INTRAVENOUS
  Filled 2020-02-01 (×4): qty 1

## 2020-02-01 MED ORDER — SODIUM CHLORIDE 0.9 % IV BOLUS
1000.0000 mL | Freq: Once | INTRAVENOUS | Status: AC
  Administered 2020-02-01: 1000 mL via INTRAVENOUS

## 2020-02-01 MED ORDER — HYDROCOD POLST-CPM POLST ER 10-8 MG/5ML PO SUER: 5.0000 mL | Freq: Two times a day (BID) | ORAL | Status: DC | PRN

## 2020-02-01 MED ORDER — SODIUM CHLORIDE 0.9 % IV SOLN
100.0000 mg | Freq: Every day | INTRAVENOUS | Status: AC
  Administered 2020-02-02 – 2020-02-05 (×4): 100 mg via INTRAVENOUS
  Filled 2020-02-01 (×4): qty 20

## 2020-02-01 MED ORDER — ONDANSETRON HCL 4 MG PO TABS: 4.0000 mg | ORAL_TABLET | Freq: Four times a day (QID) | ORAL | Status: DC | PRN

## 2020-02-01 MED ORDER — ONDANSETRON HCL 4 MG/2ML IJ SOLN
4.0000 mg | Freq: Four times a day (QID) | INTRAMUSCULAR | Status: DC | PRN
  Administered 2020-02-02: 18:00:00 4 mg via INTRAVENOUS
  Filled 2020-02-01: qty 2

## 2020-02-01 MED ORDER — SODIUM CHLORIDE 0.9 % IV SOLN
200.0000 mg | Freq: Once | INTRAVENOUS | Status: AC
  Administered 2020-02-01: 200 mg via INTRAVENOUS
  Filled 2020-02-01: qty 200

## 2020-02-01 NOTE — ED Notes (Signed)
Resumed care of patient at this time. Pt states she has no pain, other than cough at this time.

## 2020-02-01 NOTE — Progress Notes (Signed)
Remdesivir - Pharmacy Brief Note   O:  ALT:  CXR:  SpO2: 95 % on RA   A/P:  Remdesivir 200 mg IVPB once followed by 100 mg IVPB daily x 4 days.   Nakina Spatz D 02/01/2020 8:47 PM

## 2020-02-01 NOTE — ED Triage Notes (Signed)
Pt grandson states that pt's daughter tested positive for covid is hospitalized with covid pneumonia- pt has cough and sore throat- pt appears stable at this time- pt has difficulty hearing and seeing

## 2020-02-01 NOTE — H&P (Signed)
History and Physical    Tami [name] YNW:[id number] DOB: May 09, 1924 DOA: 02/01/2020  I have briefly reviewed the patient's prior medical records in [facility]  PCP: Malva Limes, MD  Patient coming from: home  Chief Complaint: sore throat, shortness of breath   HPI: Tami [name] is a 86 y.o. female with medical history significant of permanent A fib, HTN, HLD, CKD 3a presents to the hospital with complaints of "feeling sick". Patient is very HOH and communication is somewhat difficult but she tells me that she has been having a sore throat which is the main issue for her, also has been complaining of a cough with thick mucus production that makes her short of breath at times.  She complains of weakness.  She denies any chest pain, denies any abdominal pain, nausea vomiting or diarrhea.  She denies any palpitations.  Of note, patient's daughter is also hospitalized with COVID-19.  ED Course: In the ED she is afebrile, her heart rate is in the 70s, EKG shows rate controlled A. fib, she is normotensive and satting well on room air but desatted into the mid 80s with ambulation in the room.  Blood work shows a sodium of 127, creatinine 1.09.  Her platelets are 98, hemoglobin is 11.0.  Chest x-ray shows changes concerning for bibasilar opacities.  SARS-CoV-2 is positive.  We are asked to admit for Covid pneumonia.  Review of Systems: All systems reviewed, and apart from HPI, all negative  Past Medical History:  Diagnosis Date  . Allergy   . Chicken pox   . Chronic kidney disease   . Closed left hip fracture (HCC) 12/26/2017  . Hyperlipidemia   . Hypertension   . Measles   . Mumps   . Osteoarthritis   . Osteoporosis   . Periorbital cellulitis of left eye 01/04/2015  . Vitamin D deficiency     Past Surgical History:  Procedure Laterality Date  . ABDOMINAL HYSTERECTOMY    . APPENDECTOMY    . Excision of inferior parathyroid gland Left 05/25/2004   Dr. Lemar Livings  . Fracture of  Fibula/Tibula Left   . HIP SURGERY    . INTRAMEDULLARY (IM) NAIL INTERTROCHANTERIC Left 12/27/2017   Procedure: INTRAMEDULLARY (IM) NAIL INTERTROCHANTRIC- AFFIXUS;  Surgeon: Kennedy Bucker, MD;  Location: [facility] ORS;  Service: Orthopedics;  Laterality: Left;  . ORIF WRIST FRACTURE Left 12/27/2017   Procedure: OPEN REDUCTION INTERNAL FIXATION (ORIF) WRIST FRACTURE;  Surgeon: Kennedy Bucker, MD;  Location: [facility] ORS;  Service: Orthopedics;  Laterality: Left;  . OVARIAN CYST REMOVAL       reports that she has never smoked. She has never used smokeless tobacco. She reports that she does not drink alcohol and does not use drugs.  Allergies  Allergen Reactions  . Fluoxetine     confusion  . Maxzide  [Hydrochlorothiazide W-Triamterene]   . Sulfa Antibiotics     Family History  Problem Relation Age of Onset  . Heart disease Mother   . Hypertension Mother   . Hypertension Father   . Lung cancer Daughter     Prior to Admission medications   Medication Sig Start Date End Date Taking? Authorizing Provider  acetaminophen (TYLENOL) 325 MG tablet Take 1-2 tablets (325-650 mg total) by mouth every 6 (six) hours as needed for mild pain (pain score 1-3 or temp > 100.5). 12/31/17   Ramonita Lab, MD  acetaminophen (TYLENOL) 500 MG tablet Take 500 mg by mouth at bedtime.    [provider]  azithromycin (ZITHROMAX) 250 MG tablet 2 by mouth today, then 1 daily for 4 days 01/28/20 02/02/20  Malva Limes, MD  benazepril (LOTENSIN) 10 MG tablet TAKE 1 TABLET BY MOUTH EVERY DAY 03/15/19   Malva Limes, MD  Cholecalciferol (VITAMIN D3) 2000 UNITS CHEW Chew 1 tablet by mouth daily. 01/20/15   Malva Limes, MD  loratadine (CLARITIN) 10 MG tablet Take by mouth daily.     [provider]  polyethylene glycol (MIRALAX / GLYCOLAX) packet Take 17 g by mouth daily as needed for mild constipation. Patient taking differently: Take 17 g by mouth daily.  12/31/17   Ramonita Lab, MD  verapamil  (CALAN-SR) 240 MG CR tablet Take 1 tablet (240 mg total) by mouth daily. 10/07/19   Malva Limes, MD  XARELTO 15 MG TABS tablet TAKE 1 TABLET BY MOUTH DAILY WITH SUPPER 12/24/19   Malva Limes, MD    Physical Exam: Vitals:   02/01/20 1538 02/01/20 1757 02/01/20 1800 02/01/20 2002  BP:  (!) 154/77 (!) 155/81   Pulse:  82 73 84  Resp:  18    Temp:      TempSrc:      SpO2:  95% 96% 95%  Weight: 54.4 kg     Height: 4\' 11"  (1.499 m)       Constitutional: NAD, calm, comfortable Eyes: PERRL, lids and conjunctivae normal ENMT: Mucous membranes are moist. Neck: normal, supple Respiratory: Faint bibasilar rhonchi, no wheezing, no crackles, normal respiratory effort Cardiovascular: Irregularly irregular, trace edema Abdomen: no tenderness, no masses palpated. Bowel sounds positive.  Musculoskeletal: no clubbing / cyanosis. Normal muscle tone.  Skin: no rashes Neurologic: CN 2-12 grossly intact. Strength 5/5 in all 4.   Labs on Admission: I have personally reviewed following labs and imaging studies  CBC: Recent Labs  Lab 02/01/20 1633  WBC 4.4  NEUTROABS 3.0  HGB 11.0*  HCT 32.3*  MCV 101.3*  PLT 98*   Basic Metabolic Panel: Recent Labs  Lab 02/01/20 1633  NA 127*  K 3.7  CL 92*  CO2 23  GLUCOSE 115*  BUN 25*  CREATININE 1.09*  CALCIUM 8.2*   Liver Function Tests: No results for input(s): AST, ALT, ALKPHOS, BILITOT, PROT, ALBUMIN in the last 168 hours. Coagulation Profile: No results for input(s): INR, PROTIME in the last 168 hours. BNP (last 3 results) No results for input(s): PROBNP in the last 8760 hours. CBG: No results for input(s): GLUCAP in the last 168 hours. Thyroid Function Tests: No results for input(s): TSH, T4TOTAL, FREET4, T3FREE, THYROIDAB in the last 72 hours. Urine analysis:    Component Value Date/Time   COLORURINE YELLOW (A) 02/01/2020 1818   APPEARANCEUR CLEAR (A) 02/01/2020 1818   LABSPEC 1.010 02/01/2020 1818   PHURINE 5.0  02/01/2020 1818   GLUCOSEU NEGATIVE 02/01/2020 1818   HGBUR SMALL (A) 02/01/2020 1818   BILIRUBINUR NEGATIVE 02/01/2020 1818   KETONESUR 5 (A) 02/01/2020 1818   PROTEINUR NEGATIVE 02/01/2020 1818   NITRITE NEGATIVE 02/01/2020 1818   LEUKOCYTESUR SMALL (A) 02/01/2020 1818     Radiological Exams on Admission: DG Chest 2 View  Result Date: 02/01/2020 CLINICAL DATA:  Cough.  Daughter is COVID positive. EXAM: CHEST - 2 VIEW COMPARISON:  12/26/2017 FINDINGS: Lower lung volumes from prior exam. Cardiomegaly. Atherosclerosis of the thoracic aorta. Unchanged mediastinal contours. Vague bibasilar opacities. No pulmonary edema, large pleural effusion or pneumothorax. The bones are under mineralized. No acute osseous abnormality. IMPRESSION: 1.  Lower lung volumes with vague bibasilar opacities, atelectasis versus pneumonia. 2. Cardiomegaly with aortic atherosclerosis. Aortic Atherosclerosis (ICD10-I70.0). Electronically Signed   By: Narda Rutherford M.D.   On: 02/01/2020 17:02    EKG: Independently reviewed.  Rate controlled A. fib  Assessment/Plan  Principal Problem Acute hypoxic respiratory failure due to COVID-19 pneumonia-patient will be admitted to the hospital, she will be placed on remdesivir along with steroids.  There is no role for baricitinib at this point given minimal desaturation with ambulation but currently on room air.  Maintain O2 sats above 88%.  Incentive spirometry, flutter valve, proning if able.  Active Problems Rate controlled A. fib-verapamil and Xarelto confirmed with grandson, resume.  Monitor on telemetry and if no events this can be discontinued within 1 to 2 days  Chronic kidney disease stage IIIa-creatinine currently appears at baseline  Hyponatremia-possibly due to mild dehydration given poor p.o. intake, received fluids in the ED, will avoid further fluids overnight, repeat sodium in the morning  Thrombocytopenia-chronic, dating back several years.  Monitor.  No  bleeding  Essential hypertension-continue verapamil, monitor  Macrocytic anemia-likely of chronic disease, she had a borderline B12 in 2020 at 320, recheck anemia panel in the morning.   DVT prophylaxis: on Xarelto  Code Status: Full code (per grandson)  Family Communication: d/w grandson over the phone Bed Type: medsurg with cardial monitoring Consults called: none  Obs/Inp: inpatien t  At the time of admission, it appears that the appropriate admission status for this patient is INPATIENT as it is expected that patient will require hospital care > 2 midnights. This is judged to be reasonable and necessary in order to provide the required intensity of service to ensure the patient's safety given: presenting symptoms, initial radiographic and laboratory data and in the context of their chronic comorbidities. Together, these circumstances are felt to place patient at high at high risk for further clinical deterioration threatening life, limb, or organ.  Pamella Pert, MD, PhD [facility]  Contact via [email or web address]  02/01/2020, 8:23 PM

## 2020-02-01 NOTE — ED Provider Notes (Signed)
[facility] Emergency Department Provider Note ____________________________________________  Time seen: 1605  I have reviewed the triage vital signs and the nursing notes.  HISTORY  Chief Complaint  Cough and Sore Throat  HPI Tami [name] is a 86 y.o. female presents to the ED accompanied by her grandson, for evaluation of symptoms concerning for possible Covid exposure.  Patient's daughter recently tested positive for Covid is currently being hospitalized for Covid symptoms.  Patient has reports of a cough and a sore throat at this time.  She denies any other symptoms including chest pain, shortness of breath nausea, vomiting, or dizziness.  She has a history consistent with hypertension, hyperlipidemia, chronic kidney disease, and she is hard of hearing and has low vision.   Past Medical History:  Diagnosis Date  . Allergy   . Chicken pox   . Chronic kidney disease   . Closed left hip fracture (HCC) 12/26/2017  . Hyperlipidemia   . Hypertension   . Measles   . Mumps   . Osteoarthritis   . Osteoporosis   . Periorbital cellulitis of left eye 01/04/2015  . Vitamin D deficiency     Patient Active Problem List   Diagnosis Date Noted  . Acute hypoxemic respiratory failure due to COVID-19 (HCC) 02/01/2020  . Macrocytic anemia 01/06/2015  . Atrial fibrillation (HCC) 01/06/2015  . Allergic rhinitis 01/04/2015  . Chronic kidney disease, stage 3 01/04/2015  . Cyst of eye 01/04/2015  . Hyperlipidemia 01/04/2015  . Liver cyst 01/04/2015  . Hypertension 01/04/2015  . Osteoarthritis 01/04/2015  . Osteoporosis 02/17/2014  . Vitamin D deficiency 02/01/2011    Past Surgical History:  Procedure Laterality Date  . ABDOMINAL HYSTERECTOMY    . APPENDECTOMY    . Excision of inferior parathyroid gland Left 05/25/2004   Dr. Lemar Livings  . Fracture of Fibula/Tibula Left   . HIP SURGERY    . INTRAMEDULLARY (IM) NAIL INTERTROCHANTERIC Left 12/27/2017   Procedure:  INTRAMEDULLARY (IM) NAIL INTERTROCHANTRIC- AFFIXUS;  Surgeon: Kennedy Bucker, MD;  Location: [facility] ORS;  Service: Orthopedics;  Laterality: Left;  . ORIF WRIST FRACTURE Left 12/27/2017   Procedure: OPEN REDUCTION INTERNAL FIXATION (ORIF) WRIST FRACTURE;  Surgeon: Kennedy Bucker, MD;  Location: [facility] ORS;  Service: Orthopedics;  Laterality: Left;  . OVARIAN CYST REMOVAL      Prior to Admission medications   Medication Sig Start Date End Date Taking? Authorizing Provider  acetaminophen (TYLENOL) 325 MG tablet Take 1-2 tablets (325-650 mg total) by mouth every 6 (six) hours as needed for mild pain (pain score 1-3 or temp > 100.5). 12/31/17   Ramonita Lab, MD  acetaminophen (TYLENOL) 500 MG tablet Take 500 mg by mouth at bedtime.    [provider]  azithromycin (ZITHROMAX) 250 MG tablet 2 by mouth today, then 1 daily for 4 days 01/28/20 02/02/20  Malva Limes, MD  benazepril (LOTENSIN) 10 MG tablet TAKE 1 TABLET BY MOUTH EVERY DAY 03/15/19   Malva Limes, MD  Cholecalciferol (VITAMIN D3) 2000 UNITS CHEW Chew 1 tablet by mouth daily. 01/20/15   Malva Limes, MD  loratadine (CLARITIN) 10 MG tablet Take by mouth daily.     [provider]  polyethylene glycol (MIRALAX / GLYCOLAX) packet Take 17 g by mouth daily as needed for mild constipation. Patient taking differently: Take 17 g by mouth daily.  12/31/17   Ramonita Lab, MD  verapamil (CALAN-SR) 240 MG CR tablet Take 1 tablet (240 mg total) by mouth daily. 10/07/19  Malva Limes, MD  XARELTO 15 MG TABS tablet TAKE 1 TABLET BY MOUTH DAILY WITH SUPPER 12/24/19   Malva Limes, MD    Allergies Fluoxetine, Maxzide  [hydrochlorothiazide w-triamterene], and Sulfa antibiotics  Family History  Problem Relation Age of Onset  . Heart disease Mother   . Hypertension Mother   . Hypertension Father   . Lung cancer Daughter     Social History Social History   Tobacco Use  . Smoking status: Never Smoker  . Smokeless tobacco:  Never Used  Vaping Use  . Vaping Use: Never used  Substance Use Topics  . Alcohol use: No    Alcohol/week: 0.0 standard drinks  . Drug use: No    Review of Systems  Constitutional: Negative for fever. Reports decreased appetitie Eyes: Negative for visual changes. ENT: Positive for sore throat. Cardiovascular: Negative for chest pain. Respiratory: Positive for shortness of breath. Reports cough Gastrointestinal: Negative for abdominal pain, vomiting and diarrhea. Genitourinary: Negative for dysuria. Musculoskeletal: Negative for back pain. Skin: Negative for rash. Neurological: Negative for headaches, focal weakness or numbness. ____________________________________________  PHYSICAL EXAM:  VITAL SIGNS: ED Triage Vitals  Enc Vitals Group     BP 02/01/20 1537 (!) 142/52     Pulse Rate 02/01/20 1537 (!) 56     Resp 02/01/20 1537 20     Temp 02/01/20 1537 99.1 F (37.3 C)     Temp Source 02/01/20 1537 Oral     SpO2 02/01/20 1537 97 %     Weight 02/01/20 1538 120 lb (54.4 kg)     Height 02/01/20 1538 4\' 11"  (1.499 m)     Head Circumference --      Peak Flow --      Pain Score --      Pain Loc --      Pain Edu? --      Excl. in GC? --     Constitutional: Alert and oriented. Well appearing and in no distress. Head: Normocephalic and atraumatic. Eyes: Conjunctivae are normal. PERRL. Normal extraocular movements Cardiovascular: Normal rate, regular rhythm. Normal distal pulses. Respiratory: Normal respiratory effort. No wheezes/rales/rhonchi. Gastrointestinal: Soft and nontender. No distention. Musculoskeletal: Nontender with normal range of motion in all extremities.  Neurologic:  No gross focal neurologic deficits are appreciated. ____________________________________________   LABS (pertinent positives/negatives)  Labs Reviewed  SARS CORONAVIRUS 2 BY RT PCR (HOSPITAL ORDER, PERFORMED IN [facility]) - Abnormal; Notable for the following components:       Result Value   SARS Coronavirus 2 POSITIVE (*)    All other components within normal limits  URINALYSIS, COMPLETE (UACMP) WITH MICROSCOPIC - Abnormal; Notable for the following components:   Color, Urine YELLOW (*)    APPearance CLEAR (*)    Hgb urine dipstick SMALL (*)    Ketones, ur 5 (*)    Leukocytes,Ua SMALL (*)    Bacteria, UA RARE (*)    All other components within normal limits  CBC WITH DIFFERENTIAL/PLATELET - Abnormal; Notable for the following components:   RBC 3.19 (*)    Hemoglobin 11.0 (*)    HCT 32.3 (*)    MCV 101.3 (*)    MCH 34.5 (*)    Platelets 98 (*)    All other components within normal limits  BASIC METABOLIC PANEL - Abnormal; Notable for the following components:   Sodium 127 (*)    Chloride 92 (*)    Glucose, Bld 115 (*)    BUN 25 (*)  Creatinine, Ser 1.09 (*)    Calcium 8.2 (*)    GFR calc non Af Amer 43 (*)    GFR calc Af Amer 50 (*)    All other components within normal limits  CBC WITH DIFFERENTIAL/PLATELET  COMPREHENSIVE METABOLIC PANEL  C-REACTIVE PROTEIN  FIBRIN DERIVATIVES D-DIMER ([facility] ONLY)  ABO/RH  ____________________________________________   RADIOLOGY  CXR  IMPRESSION: 1. Lower lung volumes with vague bibasilar opacities, atelectasis versus pneumonia. 2. Cardiomegaly with aortic atherosclerosis. Aortic Atherosclerosis (ICD10-I70.0). ____________________________________________  PROCEDURES  Benzonatate 200 mg PO NS 1000 ml   Procedures ____________________________________________  INITIAL IMPRESSION / ASSESSMENT AND PLAN / ED COURSE  Geriatric patient with ED evaluation of symptoms following exposure to COVID from her daughter. She reported cough, sore throat, and decreased appetite. She was found to have a overall benign exam and stable without hypoxia, sepsis, or dehydration. She was found to be hyponatremic at 127. Her CXR  reveals bibasilar pneumonia vs atelectasis. She will be admitted for treatment by the  hospitalist.   ----------------------------------------- 8:02 PM on 02/01/2020 ----------------------------------------- S/W Dr. Elvera Lennox (Hospitalist): he will see the patient in the ED for admission.    Tami [name] was evaluated in Emergency Department on 02/01/2020 for the symptoms described in the history of present illness. She was evaluated in the context of the global COVID-19 pandemic, which necessitated consideration that the patient might be at risk for infection with the SARS-CoV-2 virus that causes COVID-19. Institutional protocols and algorithms that pertain to the evaluation of patients at risk for COVID-19 are in a state of rapid change based on information released by regulatory bodies including the CDC and federal and state organizations. These policies and algorithms were followed during the patient's care in the ED. ____________________________________________  FINAL CLINICAL IMPRESSION(S) / ED DIAGNOSES  Final diagnoses:  Hyponatremia  COVID-19      Lissa Hoard, PA-C 02/01/20 2031    Dionne Bucy, MD 02/01/20 2132

## 2020-02-01 NOTE — ED Notes (Signed)
Pt assisted to wheelchair, then to toilet, back to wheelchair, and back to bed. Pt becomes dyspneic with activity, SpO2 decreased to 86% RA when ambulating. Upon returning to bed, SpO2 93% after a minute of rest

## 2020-02-01 NOTE — ED Notes (Addendum)
Pt grandson states symptoms began 2-3 days ago  Pt is not on o2 at home. Pt states she has been drinking a lot of water, but has decreased appetite and energy. Reports "I feel bad." Pt gets mildly short of breath when speaking/ambulating  Denies receiving covid vaccine  Pt grandson reports fall last week, which she was not checked out for. Pt unsure why she fell. Pt denies complaints from fall

## 2020-02-02 LAB — COMPREHENSIVE METABOLIC PANEL
ALT: 24 U/L (ref 0–44)
AST: 29 U/L (ref 15–41)
Albumin: 3.4 g/dL — ABNORMAL LOW (ref 3.5–5.0)
Alkaline Phosphatase: 60 U/L (ref 38–126)
Anion gap: 8 (ref 5–15)
BUN: 19 mg/dL (ref 8–23)
CO2: 24 mmol/L (ref 22–32)
Calcium: 7.7 mg/dL — ABNORMAL LOW (ref 8.9–10.3)
Chloride: 101 mmol/L (ref 98–111)
Creatinine, Ser: 0.74 mg/dL (ref 0.44–1.00)
GFR calc Af Amer: >60 mL/min (ref >60)
GFR calc non Af Amer: >60 mL/min (ref >60)
Glucose, Bld: 144 mg/dL — ABNORMAL HIGH (ref 70–99)
Potassium: 3.4 mmol/L — ABNORMAL LOW (ref 3.5–5.1)
Sodium: 133 mmol/L — ABNORMAL LOW (ref 135–145)
Total Bilirubin: 0.9 mg/dL (ref 0.3–1.2)
Total Protein: 6.5 g/dL (ref 6.5–8.1)

## 2020-02-02 LAB — CBC WITH DIFFERENTIAL/PLATELET
Abs Immature Granulocytes: 0.02 10*3/uL (ref 0.00–0.07)
Basophils Absolute: 0 10*3/uL (ref 0.0–0.1)
Basophils Relative: 0 %
Eosinophils Absolute: 0 10*3/uL (ref 0.0–0.5)
Eosinophils Relative: 0 %
HCT: 32.2 % — ABNORMAL LOW (ref 36.0–46.0)
Hemoglobin: 10.9 g/dL — ABNORMAL LOW (ref 12.0–15.0)
Immature Granulocytes: 1 %
Lymphocytes Relative: 14 %
Lymphs Abs: 0.5 10*3/uL — ABNORMAL LOW (ref 0.7–4.0)
MCH: 35 pg — ABNORMAL HIGH (ref 26.0–34.0)
MCHC: 33.9 g/dL (ref 30.0–36.0)
MCV: 103.5 fL — ABNORMAL HIGH (ref 80.0–100.0)
Monocytes Absolute: 0.1 10*3/uL (ref 0.1–1.0)
Monocytes Relative: 4 %
Neutro Abs: 2.7 10*3/uL (ref 1.7–7.7)
Neutrophils Relative %: 81 %
Platelets: 91 10*3/uL — ABNORMAL LOW (ref 150–400)
RBC: 3.11 MIL/uL — ABNORMAL LOW (ref 3.87–5.11)
RDW: 11.9 % (ref 11.5–15.5)
WBC: 3.4 10*3/uL — ABNORMAL LOW (ref 4.0–10.5)
nRBC: 0 % (ref 0.0–0.2)

## 2020-02-02 LAB — RETICULOCYTES
Immature Retic Fract: 11.6 % (ref 2.3–15.9)
RBC.: 3.12 MIL/uL — ABNORMAL LOW (ref 3.87–5.11)
Retic Count, Absolute: 27.5 10*3/uL (ref 19.0–186.0)
Retic Ct Pct: 0.9 % (ref 0.4–3.1)

## 2020-02-02 LAB — FIBRIN DERIVATIVES D-DIMER (ARMC ONLY): Fibrin derivatives D-dimer (ARMC): 824.24 ng/mL (FEU) — ABNORMAL HIGH (ref 0.00–499.00)

## 2020-02-02 LAB — IRON AND TIBC
Iron: 18 ug/dL — ABNORMAL LOW (ref 28–170)
Saturation Ratios: 7 % — ABNORMAL LOW (ref 10.4–31.8)
TIBC: 258 ug/dL (ref 250–450)
UIBC: 240 ug/dL

## 2020-02-02 LAB — FOLATE: Folate: 18.2 ng/mL (ref >5.9)

## 2020-02-02 LAB — VITAMIN B12: Vitamin B-12: 980 pg/mL — ABNORMAL HIGH (ref 180–914)

## 2020-02-02 LAB — FERRITIN: Ferritin: 164 ng/mL (ref 11–307)

## 2020-02-02 LAB — C-REACTIVE PROTEIN: CRP: 3.9 mg/dL — ABNORMAL HIGH (ref <1.0)

## 2020-02-02 MED ORDER — POTASSIUM CHLORIDE CRYS ER 20 MEQ PO TBCR
40.0000 meq | EXTENDED_RELEASE_TABLET | Freq: Once | ORAL | Status: AC
  Administered 2020-02-02: 40 meq via ORAL
  Filled 2020-02-02: qty 2

## 2020-02-02 MED ORDER — TRAZODONE HCL 50 MG PO TABS
25.0000 mg | ORAL_TABLET | Freq: Every evening | ORAL | Status: DC | PRN
  Filled 2020-02-02: qty 1

## 2020-02-02 MED ORDER — SODIUM CHLORIDE 0.9 % IV SOLN
INTRAVENOUS | Status: DC | PRN
  Administered 2020-02-02: 11:00:00 250 mL via INTRAVENOUS

## 2020-02-02 MED ORDER — FERROUS SULFATE 325 (65 FE) MG PO TABS
325.0000 mg | ORAL_TABLET | Freq: Two times a day (BID) | ORAL | Status: DC
  Administered 2020-02-02 – 2020-02-05 (×6): 325 mg via ORAL
  Filled 2020-02-02 (×6): qty 1

## 2020-02-02 MED ORDER — VERAPAMIL HCL ER 240 MG PO TBCR
240.0000 mg | EXTENDED_RELEASE_TABLET | Freq: Every day | ORAL | Status: DC
  Administered 2020-02-02 – 2020-02-05 (×4): 240 mg via ORAL
  Filled 2020-02-02 (×5): qty 1

## 2020-02-02 MED ORDER — LOPERAMIDE HCL 2 MG PO CAPS
2.0000 mg | ORAL_CAPSULE | ORAL | Status: DC | PRN
  Administered 2020-02-02: 2 mg via ORAL
  Filled 2020-02-02: qty 1

## 2020-02-02 MED ORDER — ACETAMINOPHEN 325 MG PO TABS
325.0000 mg | ORAL_TABLET | Freq: Four times a day (QID) | ORAL | Status: DC | PRN
  Administered 2020-02-02 – 2020-02-04 (×3): 650 mg via ORAL
  Filled 2020-02-02 (×3): qty 2

## 2020-02-02 MED ORDER — RIVAROXABAN 15 MG PO TABS: 15.0000 mg | ORAL_TABLET | Freq: Every day | ORAL | Status: DC

## 2020-02-02 NOTE — Progress Notes (Signed)
Patient with another massive loose stool. MD made aware. Will monitor through the night. Dr. Elvera Lennox stated Covid can cause diarrhea. Will continue to monitor.

## 2020-02-02 NOTE — ED Notes (Signed)
Pt to bedside, urine and diarrhea

## 2020-02-02 NOTE — Progress Notes (Signed)
[facility] Progress Note  Patient: Tami [name]    IRJ:[id number]  DOA: 02/01/2020     Date of Service: the patient was seen and examined on 02/02/2020  Brief hospital course: [name] is a 86 y.o. female with medical history significant of permanent A fib, HTN, HLD, CKD 3a presents to the hospital with complaints of "feeling sick". Patient is very HOH and communication is somewhat difficult but she tells me that she has been having a sore throat which is the main issue for her, also has been complaining of a cough with thick mucus production that makes her short of breath at times.  She complains of weakness.  She denies any chest pain, denies any abdominal pain, nausea vomiting or diarrhea.  She denies any palpitations.  Of note, patient's daughter is also hospitalized with COVID-19.  Currently plan is current care for COVID-19.  Assessment and Plan: Acute hypoxic respiratory failure due to COVID-19 pneumonia-patient will be admitted to the hospital, she will be placed on remdesivir along with steroids.  There is no role for baricitinib at this point given minimal desaturation with ambulation but currently on room air.  Maintain O2 sats above 88%.  Incentive spirometry, flutter valve, proning if able.  Active Problems Rate controlled A. fib-verapamil and Xarelto confirmed with grandson, resume.  Monitor on telemetry and if no events this can be discontinued within 1 to 2 days  Chronic kidney disease stage IIIa-creatinine currently appears at baseline  Hyponatremia-possibly due to mild dehydration given poor p.o. intake, received fluids in the ED, will avoid further fluids overnight, repeat sodium in the morning  Thrombocytopenia-chronic, dating back several years.  Monitor.  No bleeding currently holding Xarelto  Essential hypertension-continue verapamil, monitor  Macrocytic anemia-likely of chronic disease, she had a borderline B12 in 2020 at 320, recheck anemia panel  in the morning.  Diet: Regular diet DVT Prophylaxis:   Place and maintain sequential compression device Start: 02/02/20 1623    Advance goals of care discussion: DNR  Family Communication: family was present at bedside, at the time of interview.  The pt provided permission to discuss medical plan with the family. Opportunity was given to ask question and all questions were answered satisfactorily.   Disposition:  Status is: Inpatient  Remains inpatient appropriate because:Inpatient level of care appropriate due to severity of illness   Dispo: The patient is from: Home              Anticipated d/c is to: Home  Pace               Anticipated d/c date is: 2 days              Patient currently is not medically stable to d/c.  Subjective: Hard of hearing. Denies any complaint.  Has some shortness of breath.  Physical Exam:  General: Appear in mild distress, no Rash; Oral Mucosa Clear, moist. no Abnormal Neck Mass Or lumps, Conjunctiva normal  Cardiovascular: S1 and S2 Present, no Murmur, Respiratory: good respiratory effort, Bilateral Air entry present and bilateral  Crackles, no wheezes Abdomen: Bowel Sound present, Soft and no tenderness Extremities: no Pedal edema, no calf tenderness Neurology: alert and oriented to time, place, and person affect appropriate. no new focal deficit Gait not checked due to patient safety concerns  Vitals:   02/02/20 1606 02/02/20 1625 02/02/20 1640 02/02/20 1957  BP:  (!) 146/97  (!) 91/54  Pulse: (!) 36 73 79 (!) 51  Resp:  16   17  Temp: 98.2 F (36.8 C)   98 F (36.7 C)  TempSrc: Oral   Oral  SpO2: 100% 96% 95% 96%  Weight:      Height:        Intake/Output Summary (Last 24 hours) at 02/02/2020 1958 Last data filed at 02/02/2020 1524 Gross per 24 hour  Intake 390.44 ml  Output --  Net 390.44 ml   Filed Weights   02/01/20 1538 02/02/20 0000  Weight: 54.4 kg 57.8 kg    Data Reviewed: I have personally reviewed and interpreted  daily labs, tele strips, imagings as discussed above. I reviewed all nursing notes, pharmacy notes, vitals, pertinent old records I have discussed plan of care as described above with RN and patient/family.  CBC: Recent Labs  Lab 02/01/20 1633 02/02/20 0437  WBC 4.4 3.4*  NEUTROABS 3.0 2.7  HGB 11.0* 10.9*  HCT 32.3* 32.2*  MCV 101.3* 103.5*  PLT 98* 91*   Basic Metabolic Panel: Recent Labs  Lab 02/01/20 1633 02/02/20 0437  NA 127* 133*  K 3.7 3.4*  CL 92* 101  CO2 23 24  GLUCOSE 115* 144*  BUN 25* 19  CREATININE 1.09* 0.74  CALCIUM 8.2* 7.7*    Studies: No results found.  Scheduled Meds: . vitamin C  500 mg Oral Daily  . ferrous sulfate  325 mg Oral BID WC  . methylPREDNISolone (SOLU-MEDROL) injection  0.5 mg/kg Intravenous Q12H  . verapamil  240 mg Oral Daily  . zinc sulfate  220 mg Oral Daily   Continuous Infusions: . sodium chloride Stopped (02/02/20 1117)  . remdesivir 100 mg in NS 100 mL Stopped (02/02/20 1147)   PRN Meds: sodium chloride, acetaminophen, chlorpheniramine-HYDROcodone, guaiFENesin-dextromethorphan, loperamide, ondansetron **OR** ondansetron (ZOFRAN) IV, traZODone  Time spent: 35 minutes  Author: Lynden Oxford, MD [facility] 02/02/2020 7:58 PM  To reach On-call, see care teams to locate the attending and reach out via [email or web address]. Between 7PM-7AM, please contact night-coverage If you still have difficulty reaching the attending provider, please page the [facility] (Director on Call) for [facility] on amion for assistance.

## 2020-02-02 NOTE — TOC Initial Note (Signed)
Transition of Care [facility]) - Initial/Assessment Note    Patient Details  Name: Tami [name] MRN: [id number] Date of Birth: August 24, 1923  Transition of Care [facility]) CM/SW Contact:    Tami Butcher, RN Phone Number: 02/02/2020, 3:00 PM  Clinical Narrative:                 Patient admitted to the hospital with COVID.  Patient is blind and deaf, RNCM was able to speak with the patient's daughter who is also admitted to the hospital and in the same room.  Tami [name], the daughter, is the patient's caregiver.  Patient is able to wash off and dress but needs help with everything else.  Patient can walk with a walker.  [facility] but [facility] have not started yet. Daughter was supposed to sign paperwork for [facility] on 9/1 but will likely still be in the hospital.  RNCM will cont to follow patient and assess for discharge needs.   Expected Discharge Plan: Home w Home Health Services Barriers to Discharge: Continued Medical Work up   Patient Goals and CMS Choice   CMS Medicare.gov Compare Post Acute Care list provided to:: Patient Represenative (must comment) Choice offered to / list presented to : Adult Children  Expected Discharge Plan and Services Expected Discharge Plan: Home w Home Health Services   Discharge Planning Services: CM Consult Post Acute Care Choice: Home Health Living arrangements for the past 2 months: Single Family Home                                      Prior Living Arrangements/Services Living arrangements for the past 2 months: Single Family Home Lives with:: Adult Children Patient language and need for interpreter reviewed:: Yes Do you feel safe going back to the place where you live?: Yes      Need for Family Participation in Patient Care: Yes (Comment) (COVID- deaf, blind) Care giver support system in place?: Yes (comment) Current home services: DME (walker) Criminal Activity/Legal Involvement Pertinent to Current  Situation/Hospitalization: No - Comment as needed  Activities of Daily Living      Permission Sought/Granted Permission sought to share information with : Case Manager, Family Supports Permission granted to share information with : Yes, Verbal Permission Granted  Share Information with NAME: Tami [name]     Permission granted to share info w Relationship: daughter     Emotional Assessment       Orientation: : Oriented to Self, Oriented to Place Alcohol / Substance Use: Not Applicable Psych Involvement: No (comment)  Admission diagnosis:  Hyponatremia [E87.1] Acute hypoxemic respiratory failure due to COVID-19 (HCC) [U07.1, J96.01] COVID-19 [U07.1] Patient Active Problem List   Diagnosis Date Noted  . Acute hypoxemic respiratory failure due to COVID-19 (HCC) 02/01/2020  . Macrocytic anemia 01/06/2015  . Atrial fibrillation (HCC) 01/06/2015  . Allergic rhinitis 01/04/2015  . Chronic kidney disease, stage 3 01/04/2015  . Cyst of eye 01/04/2015  . Hyperlipidemia 01/04/2015  . Liver cyst 01/04/2015  . Hypertension 01/04/2015  . Osteoarthritis 01/04/2015  . Osteoporosis 02/17/2014  . Vitamin D deficiency 02/01/2011   PCP:  Tami Limes, MD Pharmacy:   [facility] [phone] [place], [place] - [address] AT [facility] OF [address] [place] [address] Phone: [phone] Fax: [phone]     Social Determinants of Health (SDOH)  Interventions    Readmission Risk Interventions No flowsheet data found.

## 2020-02-02 NOTE — ED Notes (Addendum)
Bed alarm responded to: Pt sitting at edge of bed, pt assisted to bedside commode and cleaned, pt has appearing liquid brown stool, bed linen soiled changed and new chux applied  Pt now appears incontinent of diarrhea

## 2020-02-02 NOTE — ED Notes (Signed)
Warm blanket given

## 2020-02-03 LAB — COMPREHENSIVE METABOLIC PANEL
ALT: 22 U/L (ref 0–44)
AST: 23 U/L (ref 15–41)
Albumin: 3.3 g/dL — ABNORMAL LOW (ref 3.5–5.0)
Alkaline Phosphatase: 57 U/L (ref 38–126)
Anion gap: 9 (ref 5–15)
BUN: 36 mg/dL — ABNORMAL HIGH (ref 8–23)
CO2: 24 mmol/L (ref 22–32)
Calcium: 7.9 mg/dL — ABNORMAL LOW (ref 8.9–10.3)
Chloride: 100 mmol/L (ref 98–111)
Creatinine, Ser: 1.16 mg/dL — ABNORMAL HIGH (ref 0.44–1.00)
GFR calc Af Amer: 47 mL/min — ABNORMAL LOW (ref >60)
GFR calc non Af Amer: 40 mL/min — ABNORMAL LOW (ref >60)
Glucose, Bld: 158 mg/dL — ABNORMAL HIGH (ref 70–99)
Potassium: 4.5 mmol/L (ref 3.5–5.1)
Sodium: 133 mmol/L — ABNORMAL LOW (ref 135–145)
Total Bilirubin: 0.7 mg/dL (ref 0.3–1.2)
Total Protein: 6.4 g/dL — ABNORMAL LOW (ref 6.5–8.1)

## 2020-02-03 LAB — CBC WITH DIFFERENTIAL/PLATELET
Abs Immature Granulocytes: 0.01 10*3/uL (ref 0.00–0.07)
Basophils Absolute: 0 10*3/uL (ref 0.0–0.1)
Basophils Relative: 0 %
Eosinophils Absolute: 0 10*3/uL (ref 0.0–0.5)
Eosinophils Relative: 0 %
HCT: 35.2 % — ABNORMAL LOW (ref 36.0–46.0)
Hemoglobin: 12.2 g/dL (ref 12.0–15.0)
Immature Granulocytes: 0 %
Lymphocytes Relative: 21 %
Lymphs Abs: 0.7 10*3/uL (ref 0.7–4.0)
MCH: 34 pg (ref 26.0–34.0)
MCHC: 34.7 g/dL (ref 30.0–36.0)
MCV: 98.1 fL (ref 80.0–100.0)
Monocytes Absolute: 0.3 10*3/uL (ref 0.1–1.0)
Monocytes Relative: 9 %
Neutro Abs: 2.4 10*3/uL (ref 1.7–7.7)
Neutrophils Relative %: 70 %
Platelets: 126 10*3/uL — ABNORMAL LOW (ref 150–400)
RBC: 3.59 MIL/uL — ABNORMAL LOW (ref 3.87–5.11)
RDW: 11.9 % (ref 11.5–15.5)
WBC: 3.4 10*3/uL — ABNORMAL LOW (ref 4.0–10.5)
nRBC: 0 % (ref 0.0–0.2)

## 2020-02-03 LAB — FIBRIN DERIVATIVES D-DIMER (ARMC ONLY): Fibrin derivatives D-dimer (ARMC): 1370.06 ng/mL (FEU) — ABNORMAL HIGH (ref 0.00–499.00)

## 2020-02-03 LAB — C-REACTIVE PROTEIN: CRP: 4 mg/dL — ABNORMAL HIGH (ref <1.0)

## 2020-02-03 MED ORDER — DEXAMETHASONE SODIUM PHOSPHATE 10 MG/ML IJ SOLN
6.0000 mg | Freq: Every day | INTRAMUSCULAR | Status: DC
  Administered 2020-02-03 – 2020-02-05 (×3): 6 mg via INTRAVENOUS
  Filled 2020-02-03 (×3): qty 1

## 2020-02-03 MED ORDER — RIVAROXABAN 15 MG PO TABS
15.0000 mg | ORAL_TABLET | Freq: Every day | ORAL | Status: DC
  Administered 2020-02-03 – 2020-02-05 (×3): 15 mg via ORAL
  Filled 2020-02-03 (×3): qty 1

## 2020-02-03 NOTE — Progress Notes (Signed)
[facility] Progress Note  Patient: Tami [name]    YBF:[id number]  DOA: 02/01/2020     Date of Service: the patient was seen and examined on 02/03/2020  Brief hospital course: Tami [name] is a 86 y.o. female with medical history significant of permanent A fib, HTN, HLD, CKD 3a presents to the hospital with complaints of "feeling sick". Patient is very HOH and communication is somewhat difficult but she tells me that she has been having a sore throat which is the main issue for her, also has been complaining of a cough with thick mucus production that makes her short of breath at times.  She complains of weakness.  She denies any chest pain, denies any abdominal pain, nausea vomiting or diarrhea.  She denies any palpitations.  Of note, patient's daughter is also hospitalized with COVID-19.  Currently plan is current care for COVID-19.  Assessment and Plan: Acute hypoxic respiratory failure due to COVID-19 pneumonia Acute COVID-19 Viral Pneumonia CXR: hazy bilateral peripheral opacities Oxygen requirement: on room air CRP: 4.0 Remdesivir: Started on 02/01/2020.  Last day February 05, 2020. Steroids: On Decadron Baricitinib/Actemra(off-label use): Not indicated. Antibiotics: Not indicated Vitamin C and Zinc: Continue DVT Prophylaxis: Place and maintain sequential compression device Start: 02/02/20 1623 Rivaroxaban (XARELTO) tablet 15 mg    Prone positioning and incentive spirometer use recommended.  Overall plan: Continue to complete on remdesivir.  Patient is not hypoxic but unsafe discharge given the only caregiver is still currently in the hospital.  Not safe to discharge at SNF given agitation in unfamiliar environment and dependency.  The treatment plan and use of medications and known side effects were discussed with patient/family. It was clearly explained that Complete risks and long-term side effects are unknown, however in the best clinical judgment they seem to be of  some clinical benefit rather than medical risks. Patient/family agree with the treatment plan and want to receive these treatments as indicated.   Rate controlled chronic A. Fib On verapamil and Xarelto Xarelto was initially held, currently receiving.  Chronic kidney disease stage IIIa- creatinine currently appears at baseline  Hyponatremia possibly due to mild dehydration given poor p.o. intake, received fluid Currently stable.  Thrombocytopenia-chronic, dating back several years.  Monitor.  No bleeding Xarelto was on hold. Currently resuming  Essential hypertension- continue verapamil, monitor  Macrocytic anemia Iron deficiency anemia. likely of chronic disease, she had a borderline B12 in 2020 at 320,  B12 980. Iron level is low.  No active bleeding.  Replace  Agitation. Occasional only. Continue to monitor apparently delirium in the hospital. No treatment indicated  Diet: Regular diet DVT Prophylaxis:  Place and maintain sequential compression device Start: 02/02/20 1623 Rivaroxaban (XARELTO) tablet 15 mg    Advance goals of care discussion: DNR  Family Communication: family was present at bedside, at the time of interview.  The pt provided permission to discuss medical plan with the family. Opportunity was given to ask question and all questions were answered satisfactorily.   Disposition:  Status is: Inpatient  Remains inpatient appropriate because:Inpatient level of care appropriate due to severity of illness   Dispo: The patient is from: Home              Anticipated d/c is to: Home  [facility]               Anticipated d/c date is: 2 days              Patient currently is not  medically stable to d/c.  Subjective: Hard of hearing. Denies any complaint.    Physical Exam:  General: Appear in mild distress, no Rash; Oral Mucosa Clear, moist. no Abnormal Neck Mass Or lumps, Conjunctiva normal  Cardiovascular: S1 and S2 Present, no Murmur, Respiratory: good  respiratory effort, Bilateral Air entry present and bilateral  Crackles, no wheezes Abdomen: Bowel Sound present, Soft and no tenderness Extremities: no Pedal edema, no calf tenderness Neurology: alert and oriented to time, place, and person affect appropriate. no new focal deficit Gait not checked due to patient safety concerns  Vitals:   02/03/20 0502 02/03/20 0700 02/03/20 1119 02/03/20 1658  BP: (!) 153/73 122/76 (!) 150/76 (!) 118/46  Pulse: 76 89 63 (!) 56  Resp: 18 18 17 16   Temp: 97.6 F (36.4 C) 97.8 F (36.6 C) 98.1 F (36.7 C) 98 F (36.7 C)  TempSrc: Oral Oral Oral Oral  SpO2: 94% 93% 94% 94%  Weight:      Height:       No intake or output data in the 24 hours ending 02/03/20 1848 Filed Weights   02/01/20 1538 02/02/20 0000  Weight: 54.4 kg 57.8 kg    Data Reviewed: I have personally reviewed and interpreted daily labs, tele strips, imagings as discussed above. I reviewed all nursing notes, pharmacy notes, vitals, pertinent old records I have discussed plan of care as described above with RN and patient/family.  CBC: Recent Labs  Lab 02/01/20 1633 02/02/20 0437 02/03/20 0445  WBC 4.4 3.4* 3.4*  NEUTROABS 3.0 2.7 2.4  HGB 11.0* 10.9* 12.2  HCT 32.3* 32.2* 35.2*  MCV 101.3* 103.5* 98.1  PLT 98* 91* 126*   Basic Metabolic Panel: Recent Labs  Lab 02/01/20 1633 02/02/20 0437 02/03/20 0445  NA 127* 133* 133*  K 3.7 3.4* 4.5  CL 92* 101 100  CO2 23 24 24   GLUCOSE 115* 144* 158*  BUN 25* 19 36*  CREATININE 1.09* 0.74 1.16*  CALCIUM 8.2* 7.7* 7.9*    Studies: No results found.  Scheduled Meds:  vitamin C  500 mg Oral Daily   dexamethasone (DECADRON) injection  6 mg Intravenous Daily   ferrous sulfate  325 mg Oral BID WC   rivaroxaban  15 mg Oral Daily   verapamil  240 mg Oral Daily   zinc sulfate  220 mg Oral Daily   Continuous Infusions:  sodium chloride Stopped (02/02/20 1117)   remdesivir 100 mg in NS 100 mL 100 mg (02/03/20  1046)   PRN Meds: sodium chloride, acetaminophen, chlorpheniramine-HYDROcodone, guaiFENesin-dextromethorphan, loperamide, ondansetron **OR** ondansetron (ZOFRAN) IV, traZODone  Time spent: 35 minutes  Author: 02/04/20, MD [facility] 02/03/2020 6:48 PM  To reach On-call, see care teams to locate the attending and reach out via [email or web address]. Between 7PM-7AM, please contact night-coverage If you still have difficulty reaching the attending provider, please page the [facility] (Director on Call) for [facility] on amion for assistance.

## 2020-02-04 DIAGNOSIS — D6869 Other thrombophilia: Secondary | ICD-10-CM

## 2020-02-04 DIAGNOSIS — J1282 Pneumonia due to coronavirus disease 2019: Secondary | ICD-10-CM

## 2020-02-04 DIAGNOSIS — R531 Weakness: Secondary | ICD-10-CM

## 2020-02-04 LAB — COMPREHENSIVE METABOLIC PANEL
ALT: 21 U/L (ref 0–44)
AST: 20 U/L (ref 15–41)
Albumin: 3.3 g/dL — ABNORMAL LOW (ref 3.5–5.0)
Alkaline Phosphatase: 59 U/L (ref 38–126)
Anion gap: 9 (ref 5–15)
BUN: 42 mg/dL — ABNORMAL HIGH (ref 8–23)
CO2: 23 mmol/L (ref 22–32)
Calcium: 8.2 mg/dL — ABNORMAL LOW (ref 8.9–10.3)
Chloride: 97 mmol/L — ABNORMAL LOW (ref 98–111)
Creatinine, Ser: 1.08 mg/dL — ABNORMAL HIGH (ref 0.44–1.00)
GFR calc Af Amer: 51 mL/min — ABNORMAL LOW (ref >60)
GFR calc non Af Amer: 44 mL/min — ABNORMAL LOW (ref >60)
Glucose, Bld: 157 mg/dL — ABNORMAL HIGH (ref 70–99)
Potassium: 4.3 mmol/L (ref 3.5–5.1)
Sodium: 129 mmol/L — ABNORMAL LOW (ref 135–145)
Total Bilirubin: 1.1 mg/dL (ref 0.3–1.2)
Total Protein: 6.4 g/dL — ABNORMAL LOW (ref 6.5–8.1)

## 2020-02-04 LAB — CBC WITH DIFFERENTIAL/PLATELET
Abs Immature Granulocytes: 0.02 10*3/uL (ref 0.00–0.07)
Basophils Absolute: 0 10*3/uL (ref 0.0–0.1)
Basophils Relative: 0 %
Eosinophils Absolute: 0 10*3/uL (ref 0.0–0.5)
Eosinophils Relative: 0 %
HCT: 37 % (ref 36.0–46.0)
Hemoglobin: 13.1 g/dL (ref 12.0–15.0)
Immature Granulocytes: 0 %
Lymphocytes Relative: 12 %
Lymphs Abs: 0.7 10*3/uL (ref 0.7–4.0)
MCH: 35.1 pg — ABNORMAL HIGH (ref 26.0–34.0)
MCHC: 35.4 g/dL (ref 30.0–36.0)
MCV: 99.2 fL (ref 80.0–100.0)
Monocytes Absolute: 0.6 10*3/uL (ref 0.1–1.0)
Monocytes Relative: 9 %
Neutro Abs: 4.8 10*3/uL (ref 1.7–7.7)
Neutrophils Relative %: 79 %
Platelets: 136 10*3/uL — ABNORMAL LOW (ref 150–400)
RBC: 3.73 MIL/uL — ABNORMAL LOW (ref 3.87–5.11)
RDW: 11.8 % (ref 11.5–15.5)
WBC: 6.1 10*3/uL (ref 4.0–10.5)
nRBC: 0 % (ref 0.0–0.2)

## 2020-02-04 LAB — FIBRIN DERIVATIVES D-DIMER (ARMC ONLY): Fibrin derivatives D-dimer (ARMC): 1059.61 ng/mL (FEU) — ABNORMAL HIGH (ref 0.00–499.00)

## 2020-02-04 LAB — C-REACTIVE PROTEIN: CRP: 1.7 mg/dL — ABNORMAL HIGH (ref <1.0)

## 2020-02-04 MED ORDER — POLYETHYLENE GLYCOL 3350 17 G PO PACK
17.0000 g | PACK | Freq: Every day | ORAL | Status: DC
  Administered 2020-02-04 – 2020-02-05 (×2): 17 g via ORAL
  Filled 2020-02-04 (×2): qty 1

## 2020-02-04 NOTE — Progress Notes (Signed)
Patient ID: Tami [name], female   DOB: Jun 10, 1923, 86 y.o.   MRN: [id number] [facility] PROGRESS NOTE  Tami [name] YQI:[id number] DOB: [birth year] DOA: 02/01/2020 PCP: Malva Limes, MD  HPI/Subjective: Patient was admitted with sore throat and shortness of breath and found to have COVID-19 pneumonia.  Patient was observed coughing.  Not much shortness of breath.  Patient with difficulty hearing and with vision.  Her daughter who takes care of her is also admitted to the hospital.  Objective: Vitals:   02/04/20 1156 02/04/20 1556  BP: (!) 137/53 (!) 125/51  Pulse: (!) 58 (!) 52  Resp: 18 19  Temp: 97.8 F (36.6 C) 98 F (36.7 C)  SpO2: 97% 97%    Intake/Output Summary (Last 24 hours) at 02/04/2020 1604 Last data filed at 02/04/2020 1209 Gross per 24 hour  Intake --  Output 500 ml  Net -500 ml   Filed Weights   02/01/20 1538 02/02/20 0000  Weight: 54.4 kg 57.8 kg    ROS: Review of Systems  Respiratory: Positive for cough. Negative for shortness of breath.   Cardiovascular: Negative for chest pain.  Gastrointestinal: Negative for abdominal pain, nausea and vomiting.   Exam: Physical Exam HENT:     Nose: No mucosal edema.     Mouth/Throat:     Pharynx: No uvula swelling.  Eyes:     General: Lids are normal.     Conjunctiva/sclera: Conjunctivae normal.  Cardiovascular:     Rate and Rhythm: Normal rate and regular rhythm.     Heart sounds: Normal heart sounds, S1 normal and S2 normal.  Pulmonary:     Breath sounds: Examination of the right-lower field reveals decreased breath sounds and rhonchi. Examination of the left-lower field reveals decreased breath sounds and rhonchi. Decreased breath sounds and rhonchi present. No wheezing or rales.  Abdominal:     General: Abdomen is flat.     Palpations: Abdomen is soft.     Tenderness: There is no abdominal tenderness.  Musculoskeletal:     Right lower leg: No swelling.     Left lower leg: No swelling.   Skin:    General: Skin is warm.     Findings: No rash.  Neurological:     Mental Status: She is alert.     Comments: Answers questions appropriately.       Data Reviewed: Basic Metabolic Panel: Recent Labs  Lab 02/01/20 1633 02/02/20 0437 02/03/20 0445 02/04/20 0638  NA 127* 133* 133* 129*  K 3.7 3.4* 4.5 4.3  CL 92* 101 100 97*  CO2 23 24 24 23   GLUCOSE 115* 144* 158* 157*  BUN 25* 19 36* 42*  CREATININE 1.09* 0.74 1.16* 1.08*  CALCIUM 8.2* 7.7* 7.9* 8.2*   Liver Function Tests: Recent Labs  Lab 02/02/20 0437 02/03/20 0445 02/04/20 0638  AST 29 23 20   ALT 24 22 21   ALKPHOS 60 57 59  BILITOT 0.9 0.7 1.1  PROT 6.5 6.4* 6.4*  ALBUMIN 3.4* 3.3* 3.3*   CBC: Recent Labs  Lab 02/01/20 1633 02/02/20 0437 02/03/20 0445 02/04/20 0638  WBC 4.4 3.4* 3.4* 6.1  NEUTROABS 3.0 2.7 2.4 4.8  HGB 11.0* 10.9* 12.2 13.1  HCT 32.3* 32.2* 35.2* 37.0  MCV 101.3* 103.5* 98.1 99.2  PLT 98* 91* 126* 136*     Recent Results (from the past 240 hour(s))  SARS Coronavirus 2 by RT PCR (hospital order, performed in [facility] hospital lab) Nasopharyngeal Nasopharyngeal Swab  Status: Abnormal   Collection Time: 02/01/20  4:33 PM   Specimen: Nasopharyngeal Swab  Result Value Ref Range Status   SARS Coronavirus 2 POSITIVE (A) NEGATIVE Final    Comment: RESULT CALLED TO, READ BACK BY AND VERIFIED WITH: NOAH GRIFFITH 02/01/20 AT 1937 HS (NOTE) SARS-CoV-2 target nucleic acids are DETECTED  SARS-CoV-2 RNA is generally detectable in upper respiratory specimens  during the acute phase of infection.  Positive results are indicative  of the presence of the identified virus, but do not rule out bacterial infection or co-infection with other pathogens not detected by the test.  Clinical correlation with patient history and  other diagnostic information is necessary to determine patient infection status.  The expected result is negative.  Fact Sheet for Patients:    BoilerBrush.com.cy   Fact Sheet for Healthcare Providers:   https://pope.com/    This test is not yet approved or cleared by the [place] FDA and  has been authorized for detection and/or diagnosis of SARS-CoV-2 by FDA under an Emergency Use Authorization (EUA).  This EUA will remain in effect (meaning this test  can be used) for the duration of  the COVID-19 declaration under Section 564(b)(1) of the Act, 21 U.S.C. section 360-bbb-3(b)(1), unless the authorization is terminated or revoked sooner.  Performed at [facility], [address] [place], [place] [address]      Scheduled Meds: . vitamin C  500 mg Oral Daily  . dexamethasone (DECADRON) injection  6 mg Intravenous Daily  . ferrous sulfate  325 mg Oral BID WC  . rivaroxaban  15 mg Oral Daily  . verapamil  240 mg Oral Daily  . zinc sulfate  220 mg Oral Daily   Continuous Infusions: . sodium chloride Stopped (02/02/20 1117)  . remdesivir 100 mg in NS 100 mL 100 mg (02/04/20 0844)    Assessment/Plan:  1. Acute hypoxic respiratory failure secondary to COVID-19 pneumonia.  Patient now off oxygen.  Continue remdesivir day 4 today.  Continue Decadron. 2. Chronic atrial fibrillation rate controlled with verapamil and anticoagulated with Xarelto. 3. Acquired thrombophilia secondary to atrial fibrillation on Xarelto to reduce stroke risk. 4. Essential hypertension on verapamil 5. Weakness.  Physical therapy evaluation      Code Status:     Code Status Orders  (From admission, onward)         Start     Ordered   02/02/20 1426  Do not attempt resuscitation (DNR)  Continuous       Question Answer Comment  In the event of cardiac or respiratory ARREST Do not call a "code blue"   In the event of cardiac or respiratory ARREST Do not perform Intubation, CPR, defibrillation or ACLS   In the event of cardiac or respiratory ARREST Use medication by any route,  position, wound care, and other measures to relive pain and suffering. May use oxygen, suction and manual treatment of airway obstruction as needed for comfort.      02/02/20 1425        Code Status History    Date Active Date Inactive Code Status Order ID Comments User Context   02/01/2020 2025 02/02/2020 1425 Full Code [id number]  Leatha Gilding, MD ED   12/26/2017 2007 12/31/2017 1900 Full Code [id number]  Enedina Finner, MD Inpatient   Advance Care Planning Activity     Family Communication: Spoke with daughter in the next bed Disposition Plan: Status is: Inpatient  Dispo: The patient is from: Home  Anticipated d/c is to: Home with home health              Anticipated d/c date is: Potentially 02/05/2020 depending on whether her daughter is also stable for disposition or not.              Patient currently being treated for COVID-19 pneumonia  Time spent: 27 minutes  Tammy [employer]

## 2020-02-04 NOTE — Evaluation (Signed)
Physical Therapy Evaluation Patient Details Name: Tami [name] MRN: [id number] DOB: 15-Apr-1924 Today's Date: 02/04/2020   History of Present Illness  presented to ER secondary to sore throat, progressive weakness and "feeling bad"; admitted for management of acute hypoxic respiratory failure related to COVID-19 PNA.  Clinical Impression  Upon evaluation, patient alert and oriented to self only; follows simple commands, though exceptionally HOH (compounded by noise in room).  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  Generally weak and deconditioned throughout, but able to mobilize all extremities against gravity.  No focal weakness, pain or gross asymmetry noted.  Able to complete bed mobility with min assist; sit/stand, basic transfers and gait (25') with RW, min assist.  Requires hand-over-hand guidance for walker position/advancement and pathway negotiation (due to baseline visual deficits).  Short, choppy steps, but no buckling or LOB.  Endorses "feeling weak" after gait distance; vitals stable and WFL on RA throughout (95%) Would benefit from skilled PT to address above deficits and promote optimal return to PLOF.; Recommend transition to HHPT upon discharge from acute hospitalization.     Follow Up Recommendations Home health PT    Equipment Recommendations   (has RW and BSC)    Recommendations for Other Services       Precautions / Restrictions Precautions Precautions: Fall Restrictions Weight Bearing Restrictions: No      Mobility  Bed Mobility Overal bed mobility: Needs Assistance Bed Mobility: Supine to Sit;Sit to Supine     Supine to sit: Min assist Sit to supine: Min assist      Transfers Overall transfer level: Needs assistance Equipment used: Rolling walker (2 wheeled) Transfers: Sit to/from Stand Sit to Stand: Min assist         General transfer comment: assist for lift off, standing balance and walker position/management (due to  baseline visual deficits)  Ambulation/Gait Ambulation/Gait assistance: Min assist Gait Distance (Feet): 25 Feet Assistive device: Rolling walker (2 wheeled)       General Gait Details: hand-over-hand guidance for walker position/advancement and pathway negotiation (due to baseline visual deficits).  Short, choppy steps, but no buckling or LOB.  Endorses "feeling weak" after gait distance; vitals stable and WFL on RA throughout (95%)  Stairs            Wheelchair Mobility    Modified Rankin (Stroke Patients Only)       Balance Overall balance assessment: Needs assistance Sitting-balance support: No upper extremity supported;Feet supported Sitting balance-Leahy Scale: Good     Standing balance support: Bilateral upper extremity supported Standing balance-Leahy Scale: Fair                               Pertinent Vitals/Pain Pain Assessment: No/denies pain    Home Living Family/patient expects to be discharged to:: Private residence Living Arrangements: Children Available Help at Discharge: Family;Available 24 hours/day Type of Home: House Home Access: Stairs to enter Entrance Stairs-Rails: Doctor, general practice of Steps: 5 Home Layout: One level Home Equipment: Walker - 2 wheels;Cane - quad      Prior Function Level of Independence: Needs assistance         Comments: Sup with RW for transfers and household-distance gait; set up/mod indep for sponge bath and dressing activities.  Daughter assists with household chores as needed.  No home O2.  Denies fall history within previous 12 months.     Hand Dominance  Extremity/Trunk Assessment   Upper Extremity Assessment Upper Extremity Assessment: Generalized weakness (grossly 4-/5 throughout)    Lower Extremity Assessment Lower Extremity Assessment: Generalized weakness (grossly 4-/5 throughout)       Communication   Communication: HOH (Hears best from L ear)  Cognition  Arousal/Alertness: Awake/alert Behavior During Therapy: WFL for tasks assessed/performed Overall Cognitive Status: History of cognitive impairments - at baseline                                 General Comments: Per daughter, baseline dementia noted; feels confusion, mental status worsened since hospitalization      General Comments      Exercises Other Exercises Other Exercises: Educated in role of PT and progressive mobility in acute setting; educated in safety with transfers and gait, pursed lip breathing and benefits of OOB activities as able (purewick removed; CNA/RN encouraged for transfer to [facility])   Assessment/Plan    PT Assessment Patient needs continued PT services  PT Problem List Decreased strength;Decreased range of motion;Decreased activity tolerance;Decreased balance;Decreased mobility;Decreased coordination;Decreased knowledge of use of DME;Decreased safety awareness;Decreased knowledge of precautions;Decreased cognition;Cardiopulmonary status limiting activity       PT Treatment Interventions DME instruction;Gait training;Stair training;Functional mobility training;Therapeutic activities;Therapeutic exercise;Balance training;Patient/family education    PT Goals (Current goals can be found in the Care Plan section)  Acute Rehab PT Goals Patient Stated Goal: per daughter, to return home with help PT Goal Formulation: With patient/family Time For Goal Achievement: 02/18/20 Potential to Achieve Goals: Good    Frequency Min 2X/week   Barriers to discharge Decreased caregiver support daughter (primary caregiver) hospitalized with COVID-19    Co-evaluation               AM-PAC PT "6 Clicks" Mobility  Outcome Measure Help needed turning from your back to your side while in a flat bed without using bedrails?: None Help needed moving from lying on your back to sitting on the side of a flat bed without using bedrails?: A Little Help needed moving to  and from a bed to a chair (including a wheelchair)?: A Little Help needed standing up from a chair using your arms (e.g., wheelchair or bedside chair)?: A Little Help needed to walk in hospital room?: A Little Help needed climbing 3-5 steps with a railing? : A Little 6 Click Score: 19    End of Session Equipment Utilized During Treatment: Gait belt Activity Tolerance: Patient tolerated treatment well Patient left: in bed;with call bell/phone within reach;with bed alarm set Nurse Communication: Mobility status PT Visit Diagnosis: Muscle weakness (generalized) (M62.81);Difficulty in walking, not elsewhere classified (R26.2)    Time: 1610-9604 PT Time Calculation (min) (ACUTE ONLY): 27 min   Charges:   PT Evaluation $PT Eval Moderate Complexity: 1 Mod PT Treatments $Therapeutic Activity: 8-22 mins       Elijio Staples H. Manson Passey, PT, DPT, NCS 02/04/20, 6:16 PM [phone]

## 2020-02-05 DIAGNOSIS — J9600 Acute respiratory failure, unspecified whether with hypoxia or hypercapnia: Secondary | ICD-10-CM

## 2020-02-05 LAB — CBC WITH DIFFERENTIAL/PLATELET
Abs Immature Granulocytes: 0.03 10*3/uL (ref 0.00–0.07)
Basophils Absolute: 0 10*3/uL (ref 0.0–0.1)
Basophils Relative: 0 %
Eosinophils Absolute: 0 10*3/uL (ref 0.0–0.5)
Eosinophils Relative: 0 %
HCT: 37.9 % (ref 36.0–46.0)
Hemoglobin: 13.5 g/dL (ref 12.0–15.0)
Immature Granulocytes: 0 %
Lymphocytes Relative: 10 %
Lymphs Abs: 0.8 10*3/uL (ref 0.7–4.0)
MCH: 35 pg — ABNORMAL HIGH (ref 26.0–34.0)
MCHC: 35.6 g/dL (ref 30.0–36.0)
MCV: 98.2 fL (ref 80.0–100.0)
Monocytes Absolute: 0.8 10*3/uL (ref 0.1–1.0)
Monocytes Relative: 10 %
Neutro Abs: 6.3 10*3/uL (ref 1.7–7.7)
Neutrophils Relative %: 80 %
Platelets: 168 10*3/uL (ref 150–400)
RBC: 3.86 MIL/uL — ABNORMAL LOW (ref 3.87–5.11)
RDW: 11.7 % (ref 11.5–15.5)
WBC: 7.9 10*3/uL (ref 4.0–10.5)
nRBC: 0 % (ref 0.0–0.2)

## 2020-02-05 LAB — COMPREHENSIVE METABOLIC PANEL
ALT: 24 U/L (ref 0–44)
AST: 21 U/L (ref 15–41)
Albumin: 3 g/dL — ABNORMAL LOW (ref 3.5–5.0)
Alkaline Phosphatase: 62 U/L (ref 38–126)
Anion gap: 8 (ref 5–15)
BUN: 35 mg/dL — ABNORMAL HIGH (ref 8–23)
CO2: 24 mmol/L (ref 22–32)
Calcium: 8 mg/dL — ABNORMAL LOW (ref 8.9–10.3)
Chloride: 97 mmol/L — ABNORMAL LOW (ref 98–111)
Creatinine, Ser: 0.87 mg/dL (ref 0.44–1.00)
GFR calc Af Amer: >60 mL/min (ref >60)
GFR calc non Af Amer: 57 mL/min — ABNORMAL LOW (ref >60)
Glucose, Bld: 141 mg/dL — ABNORMAL HIGH (ref 70–99)
Potassium: 4.1 mmol/L (ref 3.5–5.1)
Sodium: 129 mmol/L — ABNORMAL LOW (ref 135–145)
Total Bilirubin: 1.1 mg/dL (ref 0.3–1.2)
Total Protein: 6.1 g/dL — ABNORMAL LOW (ref 6.5–8.1)

## 2020-02-05 LAB — FIBRIN DERIVATIVES D-DIMER (ARMC ONLY): Fibrin derivatives D-dimer (ARMC): 783.71 ng/mL (FEU) — ABNORMAL HIGH (ref 0.00–499.00)

## 2020-02-05 LAB — C-REACTIVE PROTEIN: CRP: 0.9 mg/dL (ref <1.0)

## 2020-02-05 MED ORDER — ZINC SULFATE 220 (50 ZN) MG PO CAPS: 220.0000 mg | ORAL_CAPSULE | Freq: Every day | ORAL | 0 refills | Status: AC

## 2020-02-05 MED ORDER — FERROUS SULFATE 325 (65 FE) MG PO TABS: 325.0000 mg | ORAL_TABLET | Freq: Every day | ORAL | 0 refills | Status: AC

## 2020-02-05 MED ORDER — POLYETHYLENE GLYCOL 3350 17 G PO PACK: 17.0000 g | PACK | Freq: Every day | ORAL | Status: AC

## 2020-02-05 MED ORDER — DEXAMETHASONE 6 MG PO TABS: 6.0000 mg | ORAL_TABLET | Freq: Every day | ORAL | 0 refills | Status: AC

## 2020-02-05 MED ORDER — ASCORBIC ACID 500 MG PO TABS: 500.0000 mg | ORAL_TABLET | Freq: Every day | ORAL | 0 refills | Status: AC

## 2020-02-05 NOTE — Care Management Important Message (Signed)
Important Message  Patient Details  Name: Tami [name] MRN: [id number] Date of Birth: 1923/09/17   Medicare Important Message Given:  Yes  Reviewed with patient's daughter.     Johnell Comings 02/05/2020, 2:03 PM

## 2020-02-05 NOTE — Discharge Instructions (Signed)
COVID-19 Frequently Asked Questions °COVID-19 (coronavirus disease) is an infection that is caused by a large family of viruses. Some viruses cause illness in people and others cause illness in animals like camels, cats, and bats. In some cases, the viruses that cause illness in animals can spread to humans. °Where did the coronavirus come from? °In December 2019, [place] told the World Health Organization (WHO) of several cases of lung disease (human respiratory illness). These cases were linked to an open seafood and livestock market in the city of [place]. The link to the seafood and livestock market suggests that the virus may have spread from animals to humans. However, since that first outbreak in December, the virus has also been shown to spread from person to person. °What is the name of the disease and the virus? °Disease name °Early on, this disease was called novel coronavirus. This is because scientists determined that the disease was caused by a new (novel) respiratory virus. The World Health Organization (WHO) has now named the disease COVID-19, or coronavirus disease. °Virus name °The virus that causes the disease is called severe acute respiratory syndrome coronavirus 2 (SARS-CoV-2). °More information on disease and virus naming °World Health Organization (WHO): www.who.int/emergencies/diseases/novel-coronavirus-2019/technical-guidance/naming-the-coronavirus-disease-(covid-2019)-and-the-virus-that-causes-it °Who is at risk for complications from coronavirus disease? °Some people may be at higher risk for complications from coronavirus disease. This includes older adults and people who have chronic diseases, such as heart disease, diabetes, and lung disease. °If you are at higher risk for complications, take these extra precautions: °· Stay home as much as possible. °· Avoid social gatherings and travel. °· Avoid close contact with others. Stay at least 6 ft (2 m) away from others, if possible. °· Wash  your hands often with soap and water for at least 20 seconds. °· Avoid touching your face, mouth, nose, or eyes. °· Keep supplies on hand at home, such as food, medicine, and cleaning supplies. °· If you must go out in public, wear a cloth face covering or face mask. Make sure your mask covers your nose and mouth. °How does coronavirus disease spread? °The virus that causes coronavirus disease spreads easily from person to person (is contagious). You may catch the virus by: °· Breathing in droplets from an infected person. Droplets can be spread by a person breathing, speaking, singing, coughing, or sneezing. °· Touching something, like a table or a doorknob, that was exposed to the virus (contaminated) and then touching your mouth, nose, or eyes. °Can I get the virus from touching surfaces or objects? °There is still a lot that we do not know about the virus that causes coronavirus disease. Scientists are basing a lot of information on what they know about similar viruses, such as: °· Viruses cannot generally survive on surfaces for long. They need a human body (host) to survive. °· It is more likely that the virus is spread by close contact with people who are sick (direct contact), such as through: °? Shaking hands or hugging. °? Breathing in respiratory droplets that travel through the air. Droplets can be spread by a person breathing, speaking, singing, coughing, or sneezing. °· It is less likely that the virus is spread when a person touches a surface or object that has the virus on it (indirect contact). The virus may be able to enter the body if the person touches a surface or object and then touches his or her face, eyes, nose, or mouth. °Can a person spread the virus without having symptoms of the disease? °  It may be possible for the virus to spread before a person has symptoms of the disease, but this is most likely not the main way the virus is spreading. It is more likely for the virus to spread by  being in close contact with people who are sick and breathing in the respiratory droplets spread by a person breathing, speaking, singing, coughing, or sneezing. °What are the symptoms of coronavirus disease? °Symptoms vary from person to person and can range from mild to severe. Symptoms may include: °· Fever or chills. °· Cough. °· Difficulty breathing or feeling short of breath. °· Headaches, body aches, or muscle aches. °· Runny or stuffy (congested) nose. °· Sore throat. °· New loss of taste or smell. °· Nausea, vomiting, or diarrhea. °These symptoms can appear anywhere from 2 to 14 days after you have been exposed to the virus. Some people may not have any symptoms. If you develop symptoms, call your health care provider. People with severe symptoms may need hospital care. °Should I be tested for this virus? °Your health care provider will decide whether to test you based on your symptoms, history of exposure, and your risk factors. °How does a health care provider test for this virus? °Health care providers will collect samples to send for testing. Samples may include: °· Taking a swab of fluid from the back of your nose and throat, your nose, or your throat. °· Taking fluid from the lungs by having you cough up mucus (sputum) into a sterile cup. °· Taking a blood sample. °Is there a treatment or vaccine for this virus? °Currently, there is no vaccine to prevent coronavirus disease. Also, there are no medicines like antibiotics or antivirals to treat the virus. A person who becomes sick is given supportive care, which means rest and fluids. A person may also relieve his or her symptoms by using over-the-counter medicines that treat sneezing, coughing, and runny nose. These are the same medicines that a person takes for the common cold. °If you develop symptoms, call your health care provider. People with severe symptoms may need hospital care. °What can I do to protect myself and my family from this  virus? ° °  ° °You can protect yourself and your family by taking the same actions that you would take to prevent the spread of other viruses. Take the following actions: °· Wash your hands often with soap and water for at least 20 seconds. If soap and water are not available, use alcohol-based hand sanitizer. °· Avoid touching your face, mouth, nose, or eyes. °· Cough or sneeze into a tissue, sleeve, or elbow. Do not cough or sneeze into your hand or the air. °? If you cough or sneeze into a tissue, throw it away immediately and wash your hands. °· Disinfect objects and surfaces that you frequently touch every day. °· Stay away from people who are sick. °· Avoid going out in public, follow guidance from your state and local health authorities. °· Avoid crowded indoor spaces. Stay at least 6 ft (2 m) away from others. °· If you must go out in public, wear a cloth face covering or face mask. Make sure your mask covers your nose and mouth. °· Stay home if you are sick, except to get medical care. Call your health care provider before you get medical care. Your health care provider will tell you how long to stay home. °· Make sure your vaccines are up to date. Ask your health care provider what   vaccines you need. °What should I do if I need to travel? °Follow travel recommendations from your local health authority, the CDC, and WHO. °Travel information and advice °· Centers for Disease Control and Prevention (CDC): www.cdc.gov/coronavirus/2019-ncov/travelers/index.html °· World Health Organization (WHO): www.who.int/emergencies/diseases/novel-coronavirus-2019/travel-advice °Know the risks and take action to protect your health °· You are at higher risk of getting coronavirus disease if you are traveling to areas with an outbreak or if you are exposed to travelers from areas with an outbreak. °· Wash your hands often and practice good hygiene to lower the risk of catching or spreading the virus. °What should I do if I  am sick? °General instructions to stop the spread of infection °· Wash your hands often with soap and water for at least 20 seconds. If soap and water are not available, use alcohol-based hand sanitizer. °· Cough or sneeze into a tissue, sleeve, or elbow. Do not cough or sneeze into your hand or the air. °· If you cough or sneeze into a tissue, throw it away immediately and wash your hands. °· Stay home unless you must get medical care. Call your health care provider or local health authority before you get medical care. °· Avoid public areas. Do not take public transportation, if possible. °· If you can, wear a mask if you must go out of the house or if you are in close contact with someone who is not sick. Make sure your mask covers your nose and mouth. °Keep your home clean °· Disinfect objects and surfaces that are frequently touched every day. This may include: °? Counters and tables. °? Doorknobs and light switches. °? Sinks and faucets. °? Electronics such as phones, remote controls, keyboards, computers, and tablets. °· Wash dishes in hot, soapy water or use a dishwasher. Air-dry your dishes. °· Wash laundry in hot water. °Prevent infecting other household members °· Let healthy household members care for children and pets, if possible. If you have to care for children or pets, wash your hands often and wear a mask. °· Sleep in a different bedroom or bed, if possible. °· Do not share personal items, such as razors, toothbrushes, deodorant, combs, brushes, towels, and washcloths. °Where to find more information °Centers for Disease Control and Prevention (CDC) °· Information and news updates: www.cdc.gov/coronavirus/2019-ncov °World Health Organization (WHO) °· Information and news updates: www.who.int/emergencies/diseases/novel-coronavirus-2019 °· Coronavirus health topic: www.who.int/health-topics/coronavirus °· Questions and answers on COVID-19: www.who.int/news-room/q-a-detail/q-a-coronaviruses °· Global  tracker: who.sprinklr.com °American Academy of Pediatrics (AAP) °· Information for families: www.healthychildren.org/English/health-issues/conditions/chest-lungs/Pages/2019-Novel-Coronavirus.aspx °The coronavirus situation is changing rapidly. Check your local health authority website or the CDC and WHO websites for updates and news. °When should I contact a health care provider? °· Contact your health care provider if you have symptoms of an infection, such as fever or cough, and you: °? Have been near anyone who is known to have coronavirus disease. °? Have come into contact with a person who is suspected to have coronavirus disease. °? Have traveled to an area where there is an outbreak of COVID-19. °When should I get emergency medical care? °· Get help right away by calling your local emergency services (911 in the [place] if you have: °? Trouble breathing. °? Pain or pressure in your chest. °? Confusion. °? Blue-tinged lips and fingernails. °? Difficulty waking from sleep. °? Symptoms that get worse. °Let the emergency medical personnel know if you think you have coronavirus disease. °Summary °· A new respiratory virus is spreading from person to person and causing   COVID-19 (coronavirus disease). °· The virus that causes COVID-19 appears to spread easily. It spreads from one person to another through droplets from breathing, speaking, singing, coughing, or sneezing. °· Older adults and those with chronic diseases are at higher risk of disease. If you are at higher risk for complications, take extra precautions. °· There is currently no vaccine to prevent coronavirus disease. There are no medicines, such as antibiotics or antivirals, to treat the virus. °· You can protect yourself and your family by washing your hands often, avoiding touching your face, and covering your coughs and sneezes. °This information is not intended to replace advice given to you by your health care provider. Make sure you discuss any  questions you have with your health care provider. °Document Revised: 03/21/2019 Document Reviewed: 09/17/2018 °Elsevier Patient Education © 2020 [employer] ° °COVID-19: How to Protect Yourself and Others °Know how it spreads °· There is currently no vaccine to prevent coronavirus disease 2019 (COVID-19). °· The best way to prevent illness is to avoid being exposed to this virus. °· The virus is thought to spread mainly from person-to-person. °? Between people who are in close contact with one another (within about 6 feet). °? Through respiratory droplets produced when an infected person coughs, sneezes or talks. °? These droplets can land in the mouths or noses of people who are nearby or possibly be inhaled into the lungs. °? COVID-19 may be spread by people who are not showing symptoms. °Everyone should °Clean your hands often °· Wash your hands often with soap and water for at least 20 seconds especially after you have been in a public place, or after blowing your nose, coughing, or sneezing. °· If soap and water are not readily available, use a hand sanitizer that contains at least 60% alcohol. Cover all surfaces of your hands and rub them together until they feel dry. °· Avoid touching your eyes, nose, and mouth with unwashed hands. °Avoid close contact °· Limit contact with others as much as possible. °· Avoid close contact with people who are sick. °· Put distance between yourself and other people. °? Remember that some people without symptoms may be able to spread virus. °? This is especially important for people who are at higher risk of getting very sick.www.cdc.gov/coronavirus/2019-ncov/need-extra-precautions/people-at-higher-risk.html °Cover your mouth and nose with a mask when around others °· You could spread COVID-19 to others even if you do not feel sick. °· Everyone should wear a mask in public settings and when around people not living in their household, especially when social distancing is  difficult to maintain. °? Masks should not be placed on young children under age 2, anyone who has trouble breathing, or is unconscious, incapacitated or otherwise unable to remove the mask without assistance. °· The mask is meant to protect other people in case you are infected. °· Do NOT use a facemask meant for a healthcare worker. °· Continue to keep about 6 feet between yourself and others. The mask is not a substitute for social distancing. °Cover coughs and sneezes °· Always cover your mouth and nose with a tissue when you cough or sneeze or use the inside of your elbow. °· Throw used tissues in the trash. °· Immediately wash your hands with soap and water for at least 20 seconds. If soap and water are not readily available, clean your hands with a hand sanitizer that contains at least 60% alcohol. °Clean and disinfect °· Clean AND disinfect frequently touched surfaces daily. This   includes tables, doorknobs, light switches, countertops, handles, desks, phones, keyboards, toilets, faucets, and sinks. www.cdc.gov/coronavirus/2019-ncov/prevent-getting-sick/disinfecting-your-home.html °· If surfaces are dirty, clean them: Use detergent or soap and water prior to disinfection. °· Then, use a household disinfectant. You can see a list of EPA-registered household disinfectants here. °cdc.gov/coronavirus °02/05/2019 °This information is not intended to replace advice given to you by your health care provider. Make sure you discuss any questions you have with your health care provider. °Document Revised: 02/13/2019 Document Reviewed: 12/12/2018 °Elsevier Patient Education © 2020 [employer] Inc. ° °

## 2020-02-05 NOTE — TOC Transition Note (Signed)
Transition of Care [facility]) - CM/SW Discharge Note   Patient Details  Name: ARYEL EDELEN MRN: [id number] Date of Birth: [birth year]  [facility] CM/SW Contact:  Allayne Butcher, RN Phone Number: 02/05/2020, 11:18 AM   Clinical Narrative:    Patient has been medically cleared for discharge home with home health services and her daughter.  [place] with [facility] is aware of discharge today and patient is set up for RN, PT, OT, aide and SW.  Patient's daughter is also discharging today and patient's grandson will be picking them both up to take them home.    Final next level of care: Home w Home Health Services Barriers to Discharge: No Barriers Identified   Patient Goals and CMS Choice Patient states their goals for this hospitalization and ongoing recovery are:: to get home CMS Medicare.gov Compare Post Acute Care list provided to:: Patient Represenative (must comment) Choice offered to / list presented to : Adult Children  Discharge Placement                       Discharge Plan and Services   Discharge Planning Services: CM Consult Post Acute Care Choice: Home Health                    HH Arranged: RN, PT, OT, Nurse's Aide, Social Work [facility] Agency: [facility] Date HH Agency Contacted: 02/05/20 Time HH Agency Contacted: [phone] Representative spoke with at [facility] Agency: Christy Gentles  Social Determinants of Health (SDOH) Interventions     Readmission Risk Interventions No flowsheet data found.

## 2020-02-05 NOTE — Discharge Summary (Addendum)
[facility] [place] [facility]   PATIENT NAME: Tami [name]    MR#:  [id number]  DATE OF BIRTH:  Sep 13, 1923  DATE OF ADMISSION:  02/01/2020 ADMITTING PHYSICIAN: Leatha Gildingostin M Gherghe, MD  DATE OF DISCHARGE: 02/05/2020  PRIMARY CARE PHYSICIAN: Malva LimesFisher, Donald E, MD    ADMISSION DIAGNOSIS:  Hyponatremia [E87.1] Acute hypoxemic respiratory failure due to COVID-19 (HCC) [U07.1, J96.01] COVID-19 [U07.1]  DISCHARGE DIAGNOSIS:  Principal Problem:   Pneumonia due to COVID-19 virus Active Problems:   Chronic kidney disease, stage 3   Hypertension   Macrocytic anemia   Atrial fibrillation (HCC)   Acquired thrombophilia (HCC)   Weakness   SECONDARY DIAGNOSIS:   Past Medical History:  Diagnosis Date  . Allergy   . Chicken pox   . Chronic kidney disease   . Closed left hip fracture (HCC) 12/26/2017  . Hyperlipidemia   . Hypertension   . Measles   . Mumps   . Osteoarthritis   . Osteoporosis   . Periorbital cellulitis of left eye 01/04/2015  . Vitamin D deficiency     HOSPITAL COURSE:   1.  Acute hypoxic respiratory failure secondary to COVID-19 pneumonia.  Patient was able to taper off oxygen. 2.  COVID-19 pneumonia.  Patient finished 5 days of remdesivir here in the hospital.  She has been on Decadron and will do 5 more days of Decadron upon getting out of the hospital.  Zinc and vitamin C prescribed.  I spoke with the patient's daughter and if she has trouble sleeping at home can get rid of the Decadron.  Isolation for 14 days from diagnosis on 02/01/2020. 3.  Chronic atrial fibrillation.  Rate controlled with verapamil and anticoagulated with Xarelto. 4.  Acquired thrombophilia secondary to atrial fibrillation.  On Xarelto to reduce stroke risk. 5.  Essential hypertension on verapamil 6.  Weakness.  Physical therapy recommended home health which was set up. 7.  Patient kept her daughter up all night last night.  She will likely do better in her home  environment. 8.  Chronic kidney disease stage IIIa secondary to age.  Creatinine 0.87 upon disposition. 9.  Hyponatremia.  Patient sodium slightly low at 129.  Can be less restrictive with sodium.  DISCHARGE CONDITIONS:   Satisfactory  CONSULTS OBTAINED:  None  DRUG ALLERGIES:   Allergies  Allergen Reactions  . Fluoxetine     confusion  . Maxzide  [Hydrochlorothiazide W-Triamterene]   . Sulfa Antibiotics     DISCHARGE MEDICATIONS:   Allergies as of 02/05/2020      Reactions   Fluoxetine    confusion   Maxzide  [hydrochlorothiazide W-triamterene]    Sulfa Antibiotics       Medication List    STOP taking these medications   azithromycin 250 MG tablet Commonly known as: ZITHROMAX   benazepril 10 MG tablet Commonly known as: LOTENSIN     TAKE these medications   acetaminophen 325 MG tablet Commonly known as: TYLENOL Take 1-2 tablets (325-650 mg total) by mouth every 6 (six) hours as needed for mild pain (pain score 1-3 or temp > 100.5).   ascorbic acid 500 MG tablet Commonly known as: VITAMIN C Take 1 tablet (500 mg total) by mouth daily.   dexamethasone 6 MG tablet Commonly known as: DECADRON Take 1 tablet (6 mg total) by mouth daily. Start taking on: February 06, 2020   ferrous sulfate 325 (65 FE) MG tablet Take 1 tablet (325 mg total) by mouth daily with  breakfast.   loratadine 10 MG tablet Commonly known as: CLARITIN Take by mouth daily.   polyethylene glycol 17 g packet Commonly known as: MIRALAX / GLYCOLAX Take 17 g by mouth daily.   verapamil 240 MG CR tablet Commonly known as: CALAN-SR Take 1 tablet (240 mg total) by mouth daily.   Vitamin D3 50 MCG (2000 UT) Chew Chew 1 tablet by mouth daily.   Xarelto 15 MG Tabs tablet Generic drug: Rivaroxaban TAKE 1 TABLET BY MOUTH DAILY WITH SUPPER   zinc sulfate 220 (50 Zn) MG capsule Take 1 capsule (220 mg total) by mouth daily.        DISCHARGE INSTRUCTIONS:   Follow-up PMD 2 weeks  If  you experience worsening of your admission symptoms, develop shortness of breath, life threatening emergency, suicidal or homicidal thoughts you must seek medical attention immediately by calling 911 or calling your MD immediately  if symptoms less severe.  You Must read complete instructions/literature along with all the possible adverse reactions/side effects for all the Medicines you take and that have been prescribed to you. Take any new Medicines after you have completely understood and accept all the possible adverse reactions/side effects.   Please note  You were cared for by a hospitalist during your hospital stay. If you have any questions about your discharge medications or the care you received while you were in the hospital after you are discharged, you can call the unit and asked to speak with the hospitalist on call if the hospitalist that took care of you is not available. Once you are discharged, your primary care physician will handle any further medical issues. Please note that NO REFILLS for any discharge medications will be authorized once you are discharged, as it is imperative that you return to your primary care physician (or establish a relationship with a primary care physician if you do not have one) for your aftercare needs so that they can reassess your need for medications and monitor your lab values.    Today   CHIEF COMPLAINT:   Chief Complaint  Patient presents with  . Cough  . Sore Throat    HISTORY OF PRESENT ILLNESS:  Tami [name]  is a 86 y.o. female came in with cough and sore throat and found to have COVID-19 pneumonia   VITAL SIGNS:  Blood pressure (!) 168/88, pulse 89, temperature 97.7 F (36.5 C), temperature source Oral, resp. rate (!) 22, height 4\' 11"  (1.499 m), weight 57.8 kg, SpO2 99 %.  I/O:    Intake/Output Summary (Last 24 hours) at 02/05/2020 1628 Last data filed at 02/05/2020 0500 Gross per 24 hour  Intake --  Output 950 ml  Net -950  ml     PHYSICAL EXAMINATION:  GENERAL:  86 y.o.-year-old patient lying in the bed with no acute distress.  EYES: Pupils equal, round, reactive to light and accommodation. No scleral icterus. Extraocular muscles intact.  HEENT: Head atraumatic, normocephalic. Oropharynx and nasopharynx clear.  NECK:  Supple, no jugular venous distention. No thyroid enlargement, no tenderness.  LUNGS: Decreased breath sounds bilateral bases, no wheezing, rales,rhonchi or crepitation. No use of accessory muscles of respiration.  CARDIOVASCULAR: S1, S2 irregular regular. No murmurs, rubs, or gallops.  ABDOMEN: Soft, non-tender, non-distended. EXTREMITIES: No pedal edema.  NEUROLOGIC: Cranial nerves II through XII are intact. Muscle strength 5/5 in all extremities. Sensation intact. Gait not checked.  PSYCHIATRIC: The patient is alert and answers questions appropriately.  SKIN: No obvious rash, lesion, or ulcer.  DATA REVIEW:   CBC Recent Labs  Lab 02/05/20 0520  WBC 7.9  HGB 13.5  HCT 37.9  PLT 168    Chemistries  Recent Labs  Lab 02/05/20 0520  NA 129*  K 4.1  CL 97*  CO2 24  GLUCOSE 141*  BUN 35*  CREATININE 0.87  CALCIUM 8.0*  AST 21  ALT 24  ALKPHOS 62  BILITOT 1.1    Microbiology Results  Results for orders placed or performed during the hospital encounter of 02/01/20  SARS Coronavirus 2 by RT PCR (hospital order, performed in [facility] hospital lab) Nasopharyngeal Nasopharyngeal Swab     Status: Abnormal   Collection Time: 02/01/20  4:33 PM   Specimen: Nasopharyngeal Swab  Result Value Ref Range Status   SARS Coronavirus 2 POSITIVE (A) NEGATIVE Final    Comment: RESULT CALLED TO, READ BACK BY AND VERIFIED WITH: NOAH GRIFFITH 02/01/20 AT 1937 HS (NOTE) SARS-CoV-2 target nucleic acids are DETECTED  SARS-CoV-2 RNA is generally detectable in upper respiratory specimens  during the acute phase of infection.  Positive results are indicative  of the presence of the identified  virus, but do not rule out bacterial infection or co-infection with other pathogens not detected by the test.  Clinical correlation with patient history and  other diagnostic information is necessary to determine patient infection status.  The expected result is negative.  Fact Sheet for Patients:   BoilerBrush.com.cy   Fact Sheet for Healthcare Providers:   https://pope.com/    This test is not yet approved or cleared by the [place] FDA and  has been authorized for detection and/or diagnosis of SARS-CoV-2 by FDA under an Emergency Use Authorization (EUA).  This EUA will remain in effect (meaning this test  can be used) for the duration of  the COVID-19 declaration under Section 564(b)(1) of the Act, 21 U.S.C. section 360-bbb-3(b)(1), unless the authorization is terminated or revoked sooner.  Performed at [facility], [address] [place], [place] [address]      Management plans discussed with the patient, family and they are in agreement.  CODE STATUS:     Code Status Orders  (From admission, onward)         Start     Ordered   02/02/20 1426  Do not attempt resuscitation (DNR)  Continuous       Question Answer Comment  In the event of cardiac or respiratory ARREST Do not call a "code blue"   In the event of cardiac or respiratory ARREST Do not perform Intubation, CPR, defibrillation or ACLS   In the event of cardiac or respiratory ARREST Use medication by any route, position, wound care, and other measures to relive pain and suffering. May use oxygen, suction and manual treatment of airway obstruction as needed for comfort.      02/02/20 1425        Code Status History    Date Active Date Inactive Code Status Order ID Comments User Context   02/01/2020 2025 02/02/2020 1425 Full Code [id number]  Leatha Gilding, MD ED   12/26/2017 2007 12/31/2017 1900 Full Code [id number]  Enedina Finner, MD Inpatient    Advance Care Planning Activity      TOTAL TIME TAKING CARE OF THIS PATIENT: 32 minutes.    Alford Highland M.D on 02/05/2020 at 4:28 PM  Between 7am to 6pm - Pager - [phone]  After 6pm go to [email or web address] - password [email or web address]  [facility]  CC: Primary  care physician; Malva Limes, MD

## 2020-02-06 ENCOUNTER — Telehealth: Payer: Self-pay | Admitting: Family Medicine

## 2020-02-06 DIAGNOSIS — M810 Age-related osteoporosis without current pathological fracture: Secondary | ICD-10-CM | POA: Diagnosis not present

## 2020-02-06 DIAGNOSIS — Z9181 History of falling: Secondary | ICD-10-CM | POA: Diagnosis not present

## 2020-02-06 DIAGNOSIS — U071 COVID-19: Secondary | ICD-10-CM | POA: Diagnosis not present

## 2020-02-06 DIAGNOSIS — N1831 Chronic kidney disease, stage 3a: Secondary | ICD-10-CM | POA: Diagnosis not present

## 2020-02-06 DIAGNOSIS — Z7901 Long term (current) use of anticoagulants: Secondary | ICD-10-CM | POA: Diagnosis not present

## 2020-02-06 DIAGNOSIS — I129 Hypertensive chronic kidney disease with stage 1 through stage 4 chronic kidney disease, or unspecified chronic kidney disease: Secondary | ICD-10-CM | POA: Diagnosis not present

## 2020-02-06 DIAGNOSIS — H548 Legal blindness, as defined in USA: Secondary | ICD-10-CM | POA: Diagnosis not present

## 2020-02-06 DIAGNOSIS — M1990 Unspecified osteoarthritis, unspecified site: Secondary | ICD-10-CM | POA: Diagnosis not present

## 2020-02-06 DIAGNOSIS — Z8781 Personal history of (healed) traumatic fracture: Secondary | ICD-10-CM | POA: Diagnosis not present

## 2020-02-06 DIAGNOSIS — J309 Allergic rhinitis, unspecified: Secondary | ICD-10-CM | POA: Diagnosis not present

## 2020-02-06 DIAGNOSIS — G309 Alzheimer's disease, unspecified: Secondary | ICD-10-CM | POA: Diagnosis not present

## 2020-02-06 DIAGNOSIS — E559 Vitamin D deficiency, unspecified: Secondary | ICD-10-CM | POA: Diagnosis not present

## 2020-02-06 DIAGNOSIS — D6869 Other thrombophilia: Secondary | ICD-10-CM | POA: Diagnosis not present

## 2020-02-06 DIAGNOSIS — E785 Hyperlipidemia, unspecified: Secondary | ICD-10-CM | POA: Diagnosis not present

## 2020-02-06 DIAGNOSIS — H9190 Unspecified hearing loss, unspecified ear: Secondary | ICD-10-CM | POA: Diagnosis not present

## 2020-02-06 DIAGNOSIS — I4891 Unspecified atrial fibrillation: Secondary | ICD-10-CM | POA: Diagnosis not present

## 2020-02-06 DIAGNOSIS — Q446 Cystic disease of liver: Secondary | ICD-10-CM | POA: Diagnosis not present

## 2020-02-06 DIAGNOSIS — J1282 Pneumonia due to coronavirus disease 2019: Secondary | ICD-10-CM | POA: Diagnosis not present

## 2020-02-06 NOTE — Telephone Encounter (Signed)
Left message on daughter [name]'s phone to call back to confirm the change for 02/23/20 at 3:40 p.m.

## 2020-02-06 NOTE — Telephone Encounter (Signed)
Vira Blanco D (daughter) was advised to inform PCP, patient is currently hospitalized and benazepril was d/c.

## 2020-02-06 NOTE — Telephone Encounter (Signed)
Ok for home health nursing.

## 2020-02-06 NOTE — Telephone Encounter (Signed)
Tami [name] Care Guide is calling to request orders for the patient that is recovering from COVID. Patient's primary caregiver Tami [name] is also recovering from COVID & double pneumonia. And Tami [name] is unable to care for the patient. Both were discharged from hospital today.  Tami [name] is requesting an order for [facility]  For the patient please advise CB- [phone]

## 2020-02-06 NOTE — Telephone Encounter (Addendum)
This patient was discharged yesterday, but her hospital follow up was scheduled for 03-03-2020. She needs to be seen sooner. Can you see if she can come in the afternoon on Monday the 20th. She can have the 3:40 slot. Thanks.

## 2020-02-10 ENCOUNTER — Telehealth: Payer: Self-pay | Admitting: Family Medicine

## 2020-02-10 DIAGNOSIS — U071 COVID-19: Secondary | ICD-10-CM | POA: Diagnosis not present

## 2020-02-10 DIAGNOSIS — E559 Vitamin D deficiency, unspecified: Secondary | ICD-10-CM | POA: Diagnosis not present

## 2020-02-10 DIAGNOSIS — H9190 Unspecified hearing loss, unspecified ear: Secondary | ICD-10-CM | POA: Diagnosis not present

## 2020-02-10 DIAGNOSIS — J309 Allergic rhinitis, unspecified: Secondary | ICD-10-CM | POA: Diagnosis not present

## 2020-02-10 DIAGNOSIS — N1831 Chronic kidney disease, stage 3a: Secondary | ICD-10-CM | POA: Diagnosis not present

## 2020-02-10 DIAGNOSIS — H548 Legal blindness, as defined in USA: Secondary | ICD-10-CM | POA: Diagnosis not present

## 2020-02-10 DIAGNOSIS — I129 Hypertensive chronic kidney disease with stage 1 through stage 4 chronic kidney disease, or unspecified chronic kidney disease: Secondary | ICD-10-CM | POA: Diagnosis not present

## 2020-02-10 DIAGNOSIS — Q446 Cystic disease of liver: Secondary | ICD-10-CM | POA: Diagnosis not present

## 2020-02-10 DIAGNOSIS — G309 Alzheimer's disease, unspecified: Secondary | ICD-10-CM | POA: Diagnosis not present

## 2020-02-10 DIAGNOSIS — D6869 Other thrombophilia: Secondary | ICD-10-CM | POA: Diagnosis not present

## 2020-02-10 DIAGNOSIS — Z9181 History of falling: Secondary | ICD-10-CM | POA: Diagnosis not present

## 2020-02-10 DIAGNOSIS — M1990 Unspecified osteoarthritis, unspecified site: Secondary | ICD-10-CM | POA: Diagnosis not present

## 2020-02-10 DIAGNOSIS — I4891 Unspecified atrial fibrillation: Secondary | ICD-10-CM | POA: Diagnosis not present

## 2020-02-10 DIAGNOSIS — J1282 Pneumonia due to coronavirus disease 2019: Secondary | ICD-10-CM | POA: Diagnosis not present

## 2020-02-10 DIAGNOSIS — Z8781 Personal history of (healed) traumatic fracture: Secondary | ICD-10-CM | POA: Diagnosis not present

## 2020-02-10 DIAGNOSIS — Z7901 Long term (current) use of anticoagulants: Secondary | ICD-10-CM | POA: Diagnosis not present

## 2020-02-10 DIAGNOSIS — E785 Hyperlipidemia, unspecified: Secondary | ICD-10-CM | POA: Diagnosis not present

## 2020-02-10 DIAGNOSIS — M810 Age-related osteoporosis without current pathological fracture: Secondary | ICD-10-CM | POA: Diagnosis not present

## 2020-02-10 NOTE — Telephone Encounter (Signed)
Tami [name] 02/10/2020 Called home health agency regarding community resource referral. Left message for referral coordinator of the [facility] agency to call me back, my info is [phone] please see ref notes for more details.  Tami [name] Care Guide, Embedded Care Coordination [facility], Care Management

## 2020-02-10 NOTE — Telephone Encounter (Signed)
Order was placed and Amil Amen was advised.

## 2020-02-11 DIAGNOSIS — I129 Hypertensive chronic kidney disease with stage 1 through stage 4 chronic kidney disease, or unspecified chronic kidney disease: Secondary | ICD-10-CM | POA: Diagnosis not present

## 2020-02-11 DIAGNOSIS — J1282 Pneumonia due to coronavirus disease 2019: Secondary | ICD-10-CM | POA: Diagnosis not present

## 2020-02-11 DIAGNOSIS — I4891 Unspecified atrial fibrillation: Secondary | ICD-10-CM | POA: Diagnosis not present

## 2020-02-11 DIAGNOSIS — D6869 Other thrombophilia: Secondary | ICD-10-CM | POA: Diagnosis not present

## 2020-02-11 DIAGNOSIS — H9190 Unspecified hearing loss, unspecified ear: Secondary | ICD-10-CM | POA: Diagnosis not present

## 2020-02-11 DIAGNOSIS — M810 Age-related osteoporosis without current pathological fracture: Secondary | ICD-10-CM | POA: Diagnosis not present

## 2020-02-11 DIAGNOSIS — U071 COVID-19: Secondary | ICD-10-CM | POA: Diagnosis not present

## 2020-02-11 DIAGNOSIS — H548 Legal blindness, as defined in USA: Secondary | ICD-10-CM | POA: Diagnosis not present

## 2020-02-11 DIAGNOSIS — E785 Hyperlipidemia, unspecified: Secondary | ICD-10-CM | POA: Diagnosis not present

## 2020-02-11 DIAGNOSIS — Z9181 History of falling: Secondary | ICD-10-CM | POA: Diagnosis not present

## 2020-02-11 DIAGNOSIS — Z8781 Personal history of (healed) traumatic fracture: Secondary | ICD-10-CM | POA: Diagnosis not present

## 2020-02-11 DIAGNOSIS — G309 Alzheimer's disease, unspecified: Secondary | ICD-10-CM | POA: Diagnosis not present

## 2020-02-11 DIAGNOSIS — E559 Vitamin D deficiency, unspecified: Secondary | ICD-10-CM | POA: Diagnosis not present

## 2020-02-11 DIAGNOSIS — N1831 Chronic kidney disease, stage 3a: Secondary | ICD-10-CM | POA: Diagnosis not present

## 2020-02-11 DIAGNOSIS — M1990 Unspecified osteoarthritis, unspecified site: Secondary | ICD-10-CM | POA: Diagnosis not present

## 2020-02-11 DIAGNOSIS — Q446 Cystic disease of liver: Secondary | ICD-10-CM | POA: Diagnosis not present

## 2020-02-11 DIAGNOSIS — Z7901 Long term (current) use of anticoagulants: Secondary | ICD-10-CM | POA: Diagnosis not present

## 2020-02-11 DIAGNOSIS — J309 Allergic rhinitis, unspecified: Secondary | ICD-10-CM | POA: Diagnosis not present

## 2020-02-12 DIAGNOSIS — J309 Allergic rhinitis, unspecified: Secondary | ICD-10-CM | POA: Diagnosis not present

## 2020-02-12 DIAGNOSIS — Z9181 History of falling: Secondary | ICD-10-CM | POA: Diagnosis not present

## 2020-02-12 DIAGNOSIS — G309 Alzheimer's disease, unspecified: Secondary | ICD-10-CM | POA: Diagnosis not present

## 2020-02-12 DIAGNOSIS — I4891 Unspecified atrial fibrillation: Secondary | ICD-10-CM | POA: Diagnosis not present

## 2020-02-12 DIAGNOSIS — Q446 Cystic disease of liver: Secondary | ICD-10-CM | POA: Diagnosis not present

## 2020-02-12 DIAGNOSIS — M810 Age-related osteoporosis without current pathological fracture: Secondary | ICD-10-CM | POA: Diagnosis not present

## 2020-02-12 DIAGNOSIS — Z7901 Long term (current) use of anticoagulants: Secondary | ICD-10-CM | POA: Diagnosis not present

## 2020-02-12 DIAGNOSIS — H9190 Unspecified hearing loss, unspecified ear: Secondary | ICD-10-CM | POA: Diagnosis not present

## 2020-02-12 DIAGNOSIS — D6869 Other thrombophilia: Secondary | ICD-10-CM | POA: Diagnosis not present

## 2020-02-12 DIAGNOSIS — E559 Vitamin D deficiency, unspecified: Secondary | ICD-10-CM | POA: Diagnosis not present

## 2020-02-12 DIAGNOSIS — E785 Hyperlipidemia, unspecified: Secondary | ICD-10-CM | POA: Diagnosis not present

## 2020-02-12 DIAGNOSIS — N1831 Chronic kidney disease, stage 3a: Secondary | ICD-10-CM | POA: Diagnosis not present

## 2020-02-12 DIAGNOSIS — J1282 Pneumonia due to coronavirus disease 2019: Secondary | ICD-10-CM | POA: Diagnosis not present

## 2020-02-12 DIAGNOSIS — H548 Legal blindness, as defined in USA: Secondary | ICD-10-CM | POA: Diagnosis not present

## 2020-02-12 DIAGNOSIS — Z8781 Personal history of (healed) traumatic fracture: Secondary | ICD-10-CM | POA: Diagnosis not present

## 2020-02-12 DIAGNOSIS — I129 Hypertensive chronic kidney disease with stage 1 through stage 4 chronic kidney disease, or unspecified chronic kidney disease: Secondary | ICD-10-CM | POA: Diagnosis not present

## 2020-02-12 DIAGNOSIS — M1990 Unspecified osteoarthritis, unspecified site: Secondary | ICD-10-CM | POA: Diagnosis not present

## 2020-02-12 DIAGNOSIS — U071 COVID-19: Secondary | ICD-10-CM | POA: Diagnosis not present

## 2020-02-13 DIAGNOSIS — G309 Alzheimer's disease, unspecified: Secondary | ICD-10-CM | POA: Diagnosis not present

## 2020-02-13 DIAGNOSIS — Z8781 Personal history of (healed) traumatic fracture: Secondary | ICD-10-CM | POA: Diagnosis not present

## 2020-02-13 DIAGNOSIS — E559 Vitamin D deficiency, unspecified: Secondary | ICD-10-CM | POA: Diagnosis not present

## 2020-02-13 DIAGNOSIS — Z7901 Long term (current) use of anticoagulants: Secondary | ICD-10-CM | POA: Diagnosis not present

## 2020-02-13 DIAGNOSIS — N1831 Chronic kidney disease, stage 3a: Secondary | ICD-10-CM | POA: Diagnosis not present

## 2020-02-13 DIAGNOSIS — H548 Legal blindness, as defined in USA: Secondary | ICD-10-CM | POA: Diagnosis not present

## 2020-02-13 DIAGNOSIS — H9190 Unspecified hearing loss, unspecified ear: Secondary | ICD-10-CM | POA: Diagnosis not present

## 2020-02-13 DIAGNOSIS — I4891 Unspecified atrial fibrillation: Secondary | ICD-10-CM | POA: Diagnosis not present

## 2020-02-13 DIAGNOSIS — M810 Age-related osteoporosis without current pathological fracture: Secondary | ICD-10-CM | POA: Diagnosis not present

## 2020-02-13 DIAGNOSIS — U071 COVID-19: Secondary | ICD-10-CM | POA: Diagnosis not present

## 2020-02-13 DIAGNOSIS — J1282 Pneumonia due to coronavirus disease 2019: Secondary | ICD-10-CM | POA: Diagnosis not present

## 2020-02-13 DIAGNOSIS — F0280 Dementia in other diseases classified elsewhere without behavioral disturbance: Secondary | ICD-10-CM

## 2020-02-13 DIAGNOSIS — E785 Hyperlipidemia, unspecified: Secondary | ICD-10-CM | POA: Diagnosis not present

## 2020-02-13 DIAGNOSIS — I129 Hypertensive chronic kidney disease with stage 1 through stage 4 chronic kidney disease, or unspecified chronic kidney disease: Secondary | ICD-10-CM | POA: Diagnosis not present

## 2020-02-13 DIAGNOSIS — M1990 Unspecified osteoarthritis, unspecified site: Secondary | ICD-10-CM | POA: Diagnosis not present

## 2020-02-13 DIAGNOSIS — J309 Allergic rhinitis, unspecified: Secondary | ICD-10-CM | POA: Diagnosis not present

## 2020-02-13 DIAGNOSIS — Q446 Cystic disease of liver: Secondary | ICD-10-CM | POA: Diagnosis not present

## 2020-02-13 DIAGNOSIS — D6869 Other thrombophilia: Secondary | ICD-10-CM | POA: Diagnosis not present

## 2020-02-13 DIAGNOSIS — Z9181 History of falling: Secondary | ICD-10-CM | POA: Diagnosis not present

## 2020-02-16 DIAGNOSIS — H548 Legal blindness, as defined in USA: Secondary | ICD-10-CM | POA: Diagnosis not present

## 2020-02-16 DIAGNOSIS — H9190 Unspecified hearing loss, unspecified ear: Secondary | ICD-10-CM | POA: Diagnosis not present

## 2020-02-16 DIAGNOSIS — D6869 Other thrombophilia: Secondary | ICD-10-CM | POA: Diagnosis not present

## 2020-02-16 DIAGNOSIS — I4891 Unspecified atrial fibrillation: Secondary | ICD-10-CM | POA: Diagnosis not present

## 2020-02-16 DIAGNOSIS — G309 Alzheimer's disease, unspecified: Secondary | ICD-10-CM | POA: Diagnosis not present

## 2020-02-16 DIAGNOSIS — Z9181 History of falling: Secondary | ICD-10-CM | POA: Diagnosis not present

## 2020-02-16 DIAGNOSIS — Q446 Cystic disease of liver: Secondary | ICD-10-CM | POA: Diagnosis not present

## 2020-02-16 DIAGNOSIS — E785 Hyperlipidemia, unspecified: Secondary | ICD-10-CM | POA: Diagnosis not present

## 2020-02-16 DIAGNOSIS — U071 COVID-19: Secondary | ICD-10-CM | POA: Diagnosis not present

## 2020-02-16 DIAGNOSIS — J1282 Pneumonia due to coronavirus disease 2019: Secondary | ICD-10-CM | POA: Diagnosis not present

## 2020-02-16 DIAGNOSIS — M1990 Unspecified osteoarthritis, unspecified site: Secondary | ICD-10-CM | POA: Diagnosis not present

## 2020-02-16 DIAGNOSIS — Z8781 Personal history of (healed) traumatic fracture: Secondary | ICD-10-CM | POA: Diagnosis not present

## 2020-02-16 DIAGNOSIS — J309 Allergic rhinitis, unspecified: Secondary | ICD-10-CM | POA: Diagnosis not present

## 2020-02-16 DIAGNOSIS — E559 Vitamin D deficiency, unspecified: Secondary | ICD-10-CM | POA: Diagnosis not present

## 2020-02-16 DIAGNOSIS — Z7901 Long term (current) use of anticoagulants: Secondary | ICD-10-CM | POA: Diagnosis not present

## 2020-02-16 DIAGNOSIS — M810 Age-related osteoporosis without current pathological fracture: Secondary | ICD-10-CM | POA: Diagnosis not present

## 2020-02-16 DIAGNOSIS — N1831 Chronic kidney disease, stage 3a: Secondary | ICD-10-CM | POA: Diagnosis not present

## 2020-02-16 DIAGNOSIS — I129 Hypertensive chronic kidney disease with stage 1 through stage 4 chronic kidney disease, or unspecified chronic kidney disease: Secondary | ICD-10-CM | POA: Diagnosis not present

## 2020-02-17 DIAGNOSIS — I129 Hypertensive chronic kidney disease with stage 1 through stage 4 chronic kidney disease, or unspecified chronic kidney disease: Secondary | ICD-10-CM | POA: Diagnosis not present

## 2020-02-17 DIAGNOSIS — Z7901 Long term (current) use of anticoagulants: Secondary | ICD-10-CM | POA: Diagnosis not present

## 2020-02-17 DIAGNOSIS — D6869 Other thrombophilia: Secondary | ICD-10-CM | POA: Diagnosis not present

## 2020-02-17 DIAGNOSIS — E785 Hyperlipidemia, unspecified: Secondary | ICD-10-CM | POA: Diagnosis not present

## 2020-02-17 DIAGNOSIS — Z9181 History of falling: Secondary | ICD-10-CM | POA: Diagnosis not present

## 2020-02-17 DIAGNOSIS — Z8781 Personal history of (healed) traumatic fracture: Secondary | ICD-10-CM | POA: Diagnosis not present

## 2020-02-17 DIAGNOSIS — M810 Age-related osteoporosis without current pathological fracture: Secondary | ICD-10-CM | POA: Diagnosis not present

## 2020-02-17 DIAGNOSIS — U071 COVID-19: Secondary | ICD-10-CM | POA: Diagnosis not present

## 2020-02-17 DIAGNOSIS — I4891 Unspecified atrial fibrillation: Secondary | ICD-10-CM | POA: Diagnosis not present

## 2020-02-17 DIAGNOSIS — H9190 Unspecified hearing loss, unspecified ear: Secondary | ICD-10-CM | POA: Diagnosis not present

## 2020-02-17 DIAGNOSIS — J309 Allergic rhinitis, unspecified: Secondary | ICD-10-CM | POA: Diagnosis not present

## 2020-02-17 DIAGNOSIS — E559 Vitamin D deficiency, unspecified: Secondary | ICD-10-CM | POA: Diagnosis not present

## 2020-02-17 DIAGNOSIS — M1990 Unspecified osteoarthritis, unspecified site: Secondary | ICD-10-CM | POA: Diagnosis not present

## 2020-02-17 DIAGNOSIS — J1282 Pneumonia due to coronavirus disease 2019: Secondary | ICD-10-CM | POA: Diagnosis not present

## 2020-02-17 DIAGNOSIS — H548 Legal blindness, as defined in USA: Secondary | ICD-10-CM | POA: Diagnosis not present

## 2020-02-17 DIAGNOSIS — Q446 Cystic disease of liver: Secondary | ICD-10-CM | POA: Diagnosis not present

## 2020-02-17 DIAGNOSIS — N1831 Chronic kidney disease, stage 3a: Secondary | ICD-10-CM | POA: Diagnosis not present

## 2020-02-17 DIAGNOSIS — G309 Alzheimer's disease, unspecified: Secondary | ICD-10-CM | POA: Diagnosis not present

## 2020-02-19 DIAGNOSIS — I129 Hypertensive chronic kidney disease with stage 1 through stage 4 chronic kidney disease, or unspecified chronic kidney disease: Secondary | ICD-10-CM | POA: Diagnosis not present

## 2020-02-19 DIAGNOSIS — U071 COVID-19: Secondary | ICD-10-CM | POA: Diagnosis not present

## 2020-02-19 DIAGNOSIS — D6869 Other thrombophilia: Secondary | ICD-10-CM | POA: Diagnosis not present

## 2020-02-19 DIAGNOSIS — J1282 Pneumonia due to coronavirus disease 2019: Secondary | ICD-10-CM | POA: Diagnosis not present

## 2020-02-19 DIAGNOSIS — H9190 Unspecified hearing loss, unspecified ear: Secondary | ICD-10-CM | POA: Diagnosis not present

## 2020-02-19 DIAGNOSIS — I4891 Unspecified atrial fibrillation: Secondary | ICD-10-CM | POA: Diagnosis not present

## 2020-02-19 DIAGNOSIS — G309 Alzheimer's disease, unspecified: Secondary | ICD-10-CM | POA: Diagnosis not present

## 2020-02-19 DIAGNOSIS — M810 Age-related osteoporosis without current pathological fracture: Secondary | ICD-10-CM | POA: Diagnosis not present

## 2020-02-19 DIAGNOSIS — M1990 Unspecified osteoarthritis, unspecified site: Secondary | ICD-10-CM | POA: Diagnosis not present

## 2020-02-19 DIAGNOSIS — Z8781 Personal history of (healed) traumatic fracture: Secondary | ICD-10-CM | POA: Diagnosis not present

## 2020-02-19 DIAGNOSIS — Z9181 History of falling: Secondary | ICD-10-CM | POA: Diagnosis not present

## 2020-02-19 DIAGNOSIS — H548 Legal blindness, as defined in USA: Secondary | ICD-10-CM | POA: Diagnosis not present

## 2020-02-19 DIAGNOSIS — J309 Allergic rhinitis, unspecified: Secondary | ICD-10-CM | POA: Diagnosis not present

## 2020-02-19 DIAGNOSIS — E785 Hyperlipidemia, unspecified: Secondary | ICD-10-CM | POA: Diagnosis not present

## 2020-02-19 DIAGNOSIS — Q446 Cystic disease of liver: Secondary | ICD-10-CM | POA: Diagnosis not present

## 2020-02-19 DIAGNOSIS — E559 Vitamin D deficiency, unspecified: Secondary | ICD-10-CM | POA: Diagnosis not present

## 2020-02-19 DIAGNOSIS — Z7901 Long term (current) use of anticoagulants: Secondary | ICD-10-CM | POA: Diagnosis not present

## 2020-02-19 DIAGNOSIS — N1831 Chronic kidney disease, stage 3a: Secondary | ICD-10-CM | POA: Diagnosis not present

## 2020-02-23 ENCOUNTER — Telehealth: Payer: Self-pay

## 2020-02-23 ENCOUNTER — Inpatient Hospital Stay: Payer: Medicare Other | Admitting: Family Medicine

## 2020-02-23 NOTE — Telephone Encounter (Signed)
Copied from CRM [id number]. Topic: Quick Communication - See Telephone Encounter >> Feb 23, 2020 11:38 AM Aretta Nip wrote: CRM for notification. See Telephone encounter for: 02/23/20.[facility] calling wanting Dr to know that pt is visiting family and will miss all her PTand OT appt for this week.

## 2020-02-24 DIAGNOSIS — D539 Nutritional anemia, unspecified: Secondary | ICD-10-CM | POA: Diagnosis not present

## 2020-02-24 DIAGNOSIS — R0902 Hypoxemia: Secondary | ICD-10-CM | POA: Diagnosis not present

## 2020-02-24 DIAGNOSIS — E876 Hypokalemia: Secondary | ICD-10-CM | POA: Diagnosis not present

## 2020-02-24 DIAGNOSIS — J9691 Respiratory failure, unspecified with hypoxia: Secondary | ICD-10-CM | POA: Diagnosis not present

## 2020-02-24 DIAGNOSIS — E8770 Fluid overload, unspecified: Secondary | ICD-10-CM | POA: Diagnosis not present

## 2020-02-24 DIAGNOSIS — R404 Transient alteration of awareness: Secondary | ICD-10-CM | POA: Diagnosis not present

## 2020-02-24 DIAGNOSIS — R918 Other nonspecific abnormal finding of lung field: Secondary | ICD-10-CM | POA: Diagnosis not present

## 2020-02-24 DIAGNOSIS — E872 Acidosis: Secondary | ICD-10-CM | POA: Diagnosis not present

## 2020-02-24 DIAGNOSIS — I482 Chronic atrial fibrillation, unspecified: Secondary | ICD-10-CM | POA: Diagnosis not present

## 2020-02-24 DIAGNOSIS — H9190 Unspecified hearing loss, unspecified ear: Secondary | ICD-10-CM | POA: Diagnosis not present

## 2020-02-24 DIAGNOSIS — Z79899 Other long term (current) drug therapy: Secondary | ICD-10-CM | POA: Diagnosis not present

## 2020-02-24 DIAGNOSIS — Z743 Need for continuous supervision: Secondary | ICD-10-CM | POA: Diagnosis not present

## 2020-02-24 DIAGNOSIS — Z7401 Bed confinement status: Secondary | ICD-10-CM | POA: Diagnosis not present

## 2020-02-24 DIAGNOSIS — D649 Anemia, unspecified: Secondary | ICD-10-CM | POA: Diagnosis not present

## 2020-02-24 DIAGNOSIS — Z882 Allergy status to sulfonamides status: Secondary | ICD-10-CM | POA: Diagnosis not present

## 2020-02-24 DIAGNOSIS — T501X5A Adverse effect of loop [high-ceiling] diuretics, initial encounter: Secondary | ICD-10-CM | POA: Diagnosis not present

## 2020-02-24 DIAGNOSIS — Z66 Do not resuscitate: Secondary | ICD-10-CM | POA: Diagnosis not present

## 2020-02-24 DIAGNOSIS — R0602 Shortness of breath: Secondary | ICD-10-CM | POA: Diagnosis not present

## 2020-02-24 DIAGNOSIS — J9819 Other pulmonary collapse: Secondary | ICD-10-CM | POA: Diagnosis not present

## 2020-02-24 DIAGNOSIS — J9601 Acute respiratory failure with hypoxia: Secondary | ICD-10-CM | POA: Diagnosis not present

## 2020-02-24 DIAGNOSIS — J90 Pleural effusion, not elsewhere classified: Secondary | ICD-10-CM | POA: Diagnosis not present

## 2020-02-24 DIAGNOSIS — Z7901 Long term (current) use of anticoagulants: Secondary | ICD-10-CM | POA: Diagnosis not present

## 2020-02-24 DIAGNOSIS — I4891 Unspecified atrial fibrillation: Secondary | ICD-10-CM | POA: Diagnosis not present

## 2020-02-26 ENCOUNTER — Telehealth: Payer: Self-pay | Admitting: Family Medicine

## 2020-02-26 NOTE — Telephone Encounter (Signed)
Pt wants to become a patient of Dr. Darrick Huntsman  She is Tami [name] May mother who is a patient of Dr. Darrick Huntsman Please call Tami [name] at  [phone]

## 2020-02-26 NOTE — Telephone Encounter (Signed)
Yes I have accepted her  

## 2020-02-27 DIAGNOSIS — R0602 Shortness of breath: Secondary | ICD-10-CM | POA: Diagnosis not present

## 2020-02-28 DIAGNOSIS — E872 Acidosis: Secondary | ICD-10-CM | POA: Diagnosis not present

## 2020-02-28 DIAGNOSIS — R404 Transient alteration of awareness: Secondary | ICD-10-CM | POA: Diagnosis not present

## 2020-02-28 DIAGNOSIS — Z7401 Bed confinement status: Secondary | ICD-10-CM | POA: Diagnosis not present

## 2020-02-28 DIAGNOSIS — Z743 Need for continuous supervision: Secondary | ICD-10-CM | POA: Diagnosis not present

## 2020-02-28 DIAGNOSIS — R0902 Hypoxemia: Secondary | ICD-10-CM | POA: Diagnosis not present

## 2020-02-28 DIAGNOSIS — J9691 Respiratory failure, unspecified with hypoxia: Secondary | ICD-10-CM | POA: Diagnosis not present

## 2020-03-01 ENCOUNTER — Telehealth: Payer: Self-pay | Admitting: Internal Medicine

## 2020-03-01 NOTE — Telephone Encounter (Signed)
Will you schedule for a new patient appt please.

## 2020-03-01 NOTE — Telephone Encounter (Signed)
Lm on [phone], daughter's phone to call and set up new patient appointment. Dr. Darrick Huntsman has ok'd

## 2020-03-01 NOTE — Telephone Encounter (Signed)
Left message to call back  

## 2020-03-03 ENCOUNTER — Inpatient Hospital Stay: Payer: Medicare Other | Admitting: Family Medicine

## 2020-03-05 DEATH — deceased

## 2020-03-16 ENCOUNTER — Ambulatory Visit: Payer: Medicare Other | Admitting: Family Medicine

## 2020-05-01 IMAGING — CR DG CHEST 1V PORT
1 series · 1 of 1 positions shown · non-contrast
Comparison: No recent studies in PACs

CLINICAL DATA: Patient fell today after losing her balance. The
patient landed on her left side.

EXAM:
PORTABLE CHEST 1 VIEW

[chest ap]
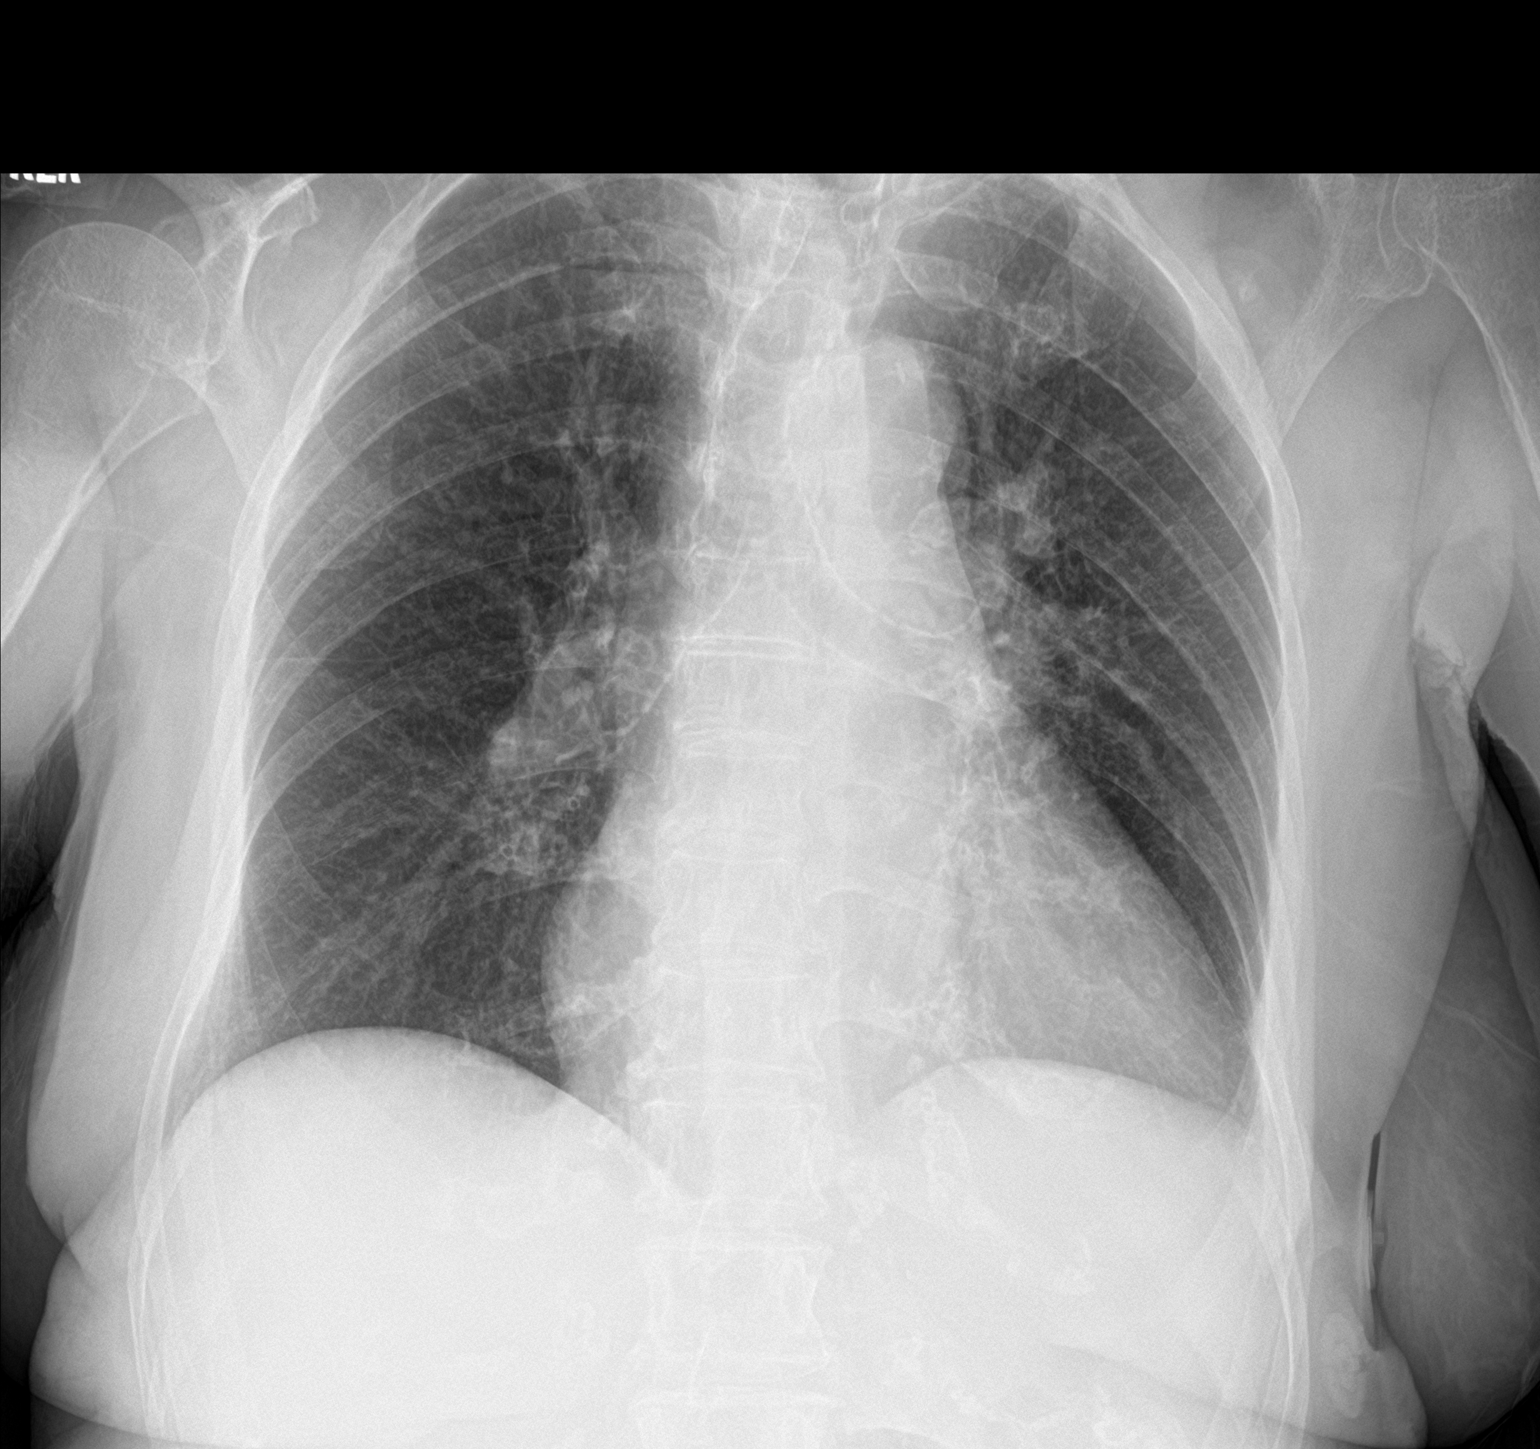

[1 of 1 positions shown; findings below may reference images not displayed]

FINDINGS: The lungs are well-expanded and clear. The heart is top-normal in
size. The pulmonary vascularity is normal. The mediastinum is normal
in width. There is calcification in the wall of the aortic arch. The
observed bony thorax exhibits no acute abnormality.
IMPRESSION: There is no evidence of acute post traumatic injury. Top-normal
cardiac size without pulmonary vascular congestion.

Thoracic aortic atherosclerosis.

## 2020-05-01 IMAGING — CR DG WRIST COMPLETE 3+V*L*
4 series · 4 of 4 positions shown · non-contrast
Comparison: None.

CLINICAL DATA: Lost balance with fall and left wrist pain, initial
encounter

EXAM:
LEFT WRIST - COMPLETE 3+ VIEW

[wrist pa]
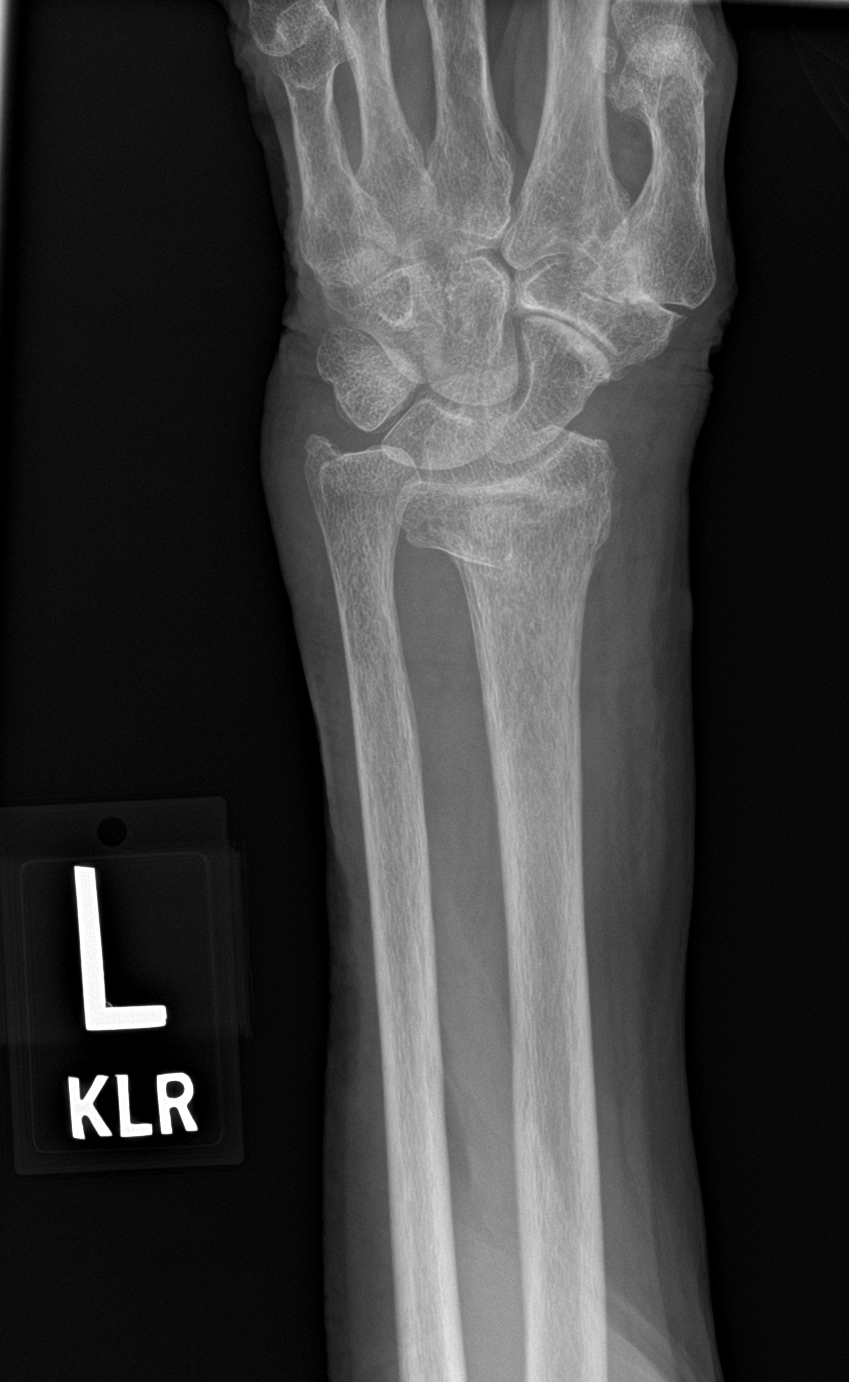

[wrist obl]
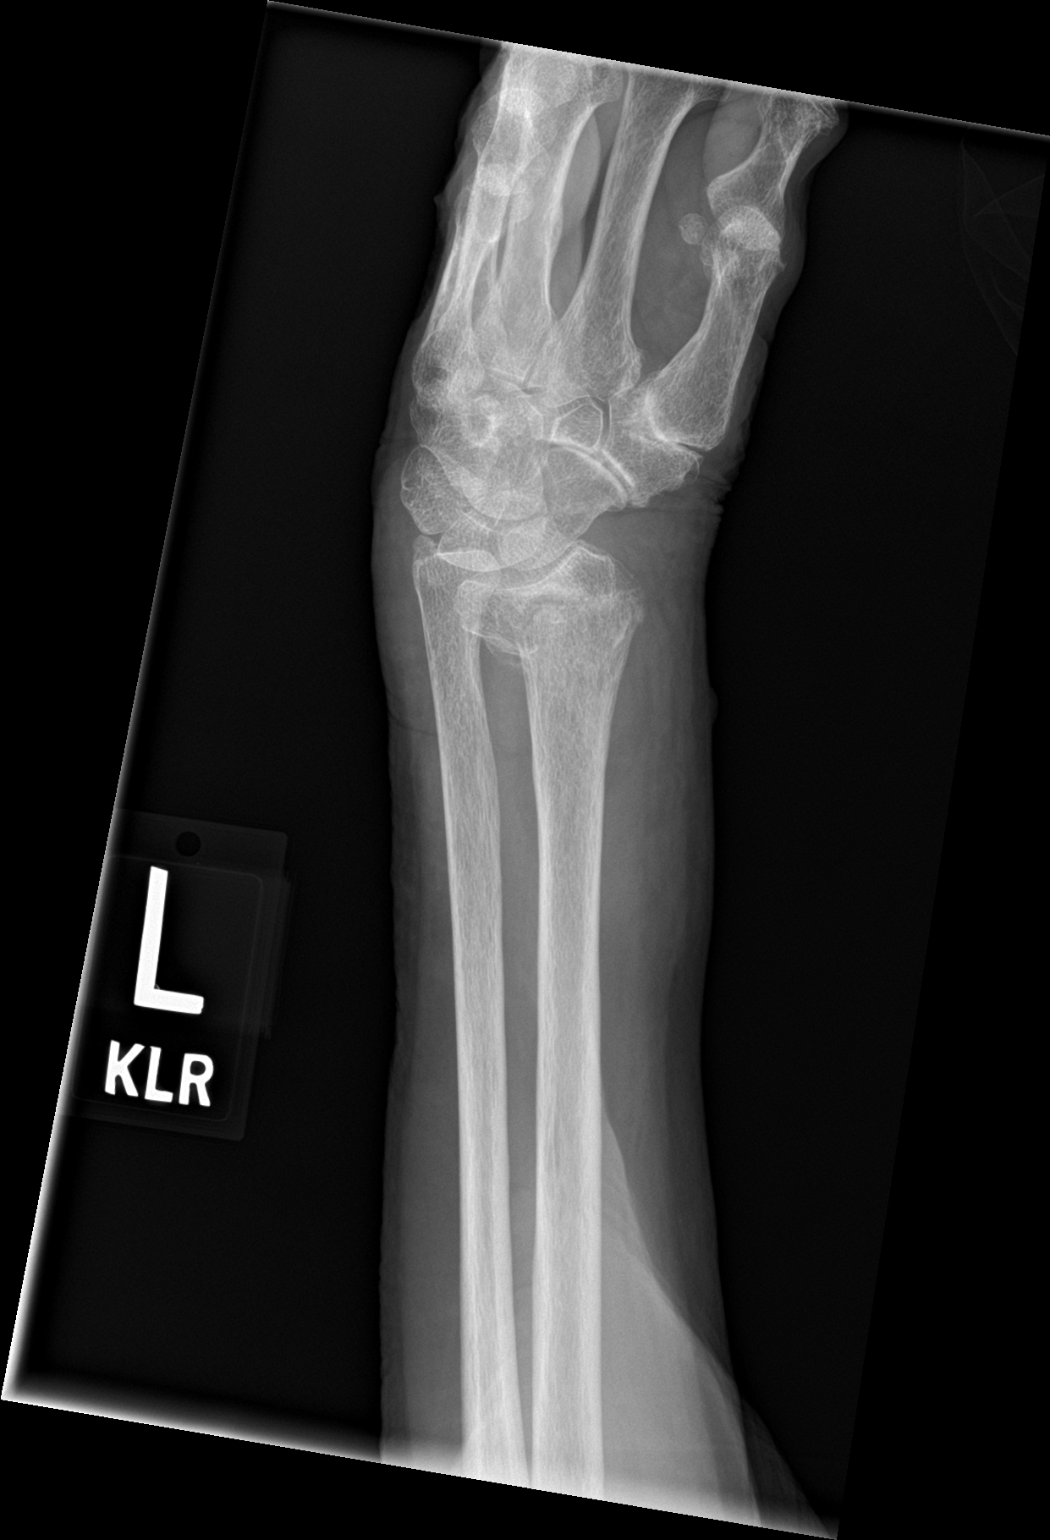

[wrist lat]
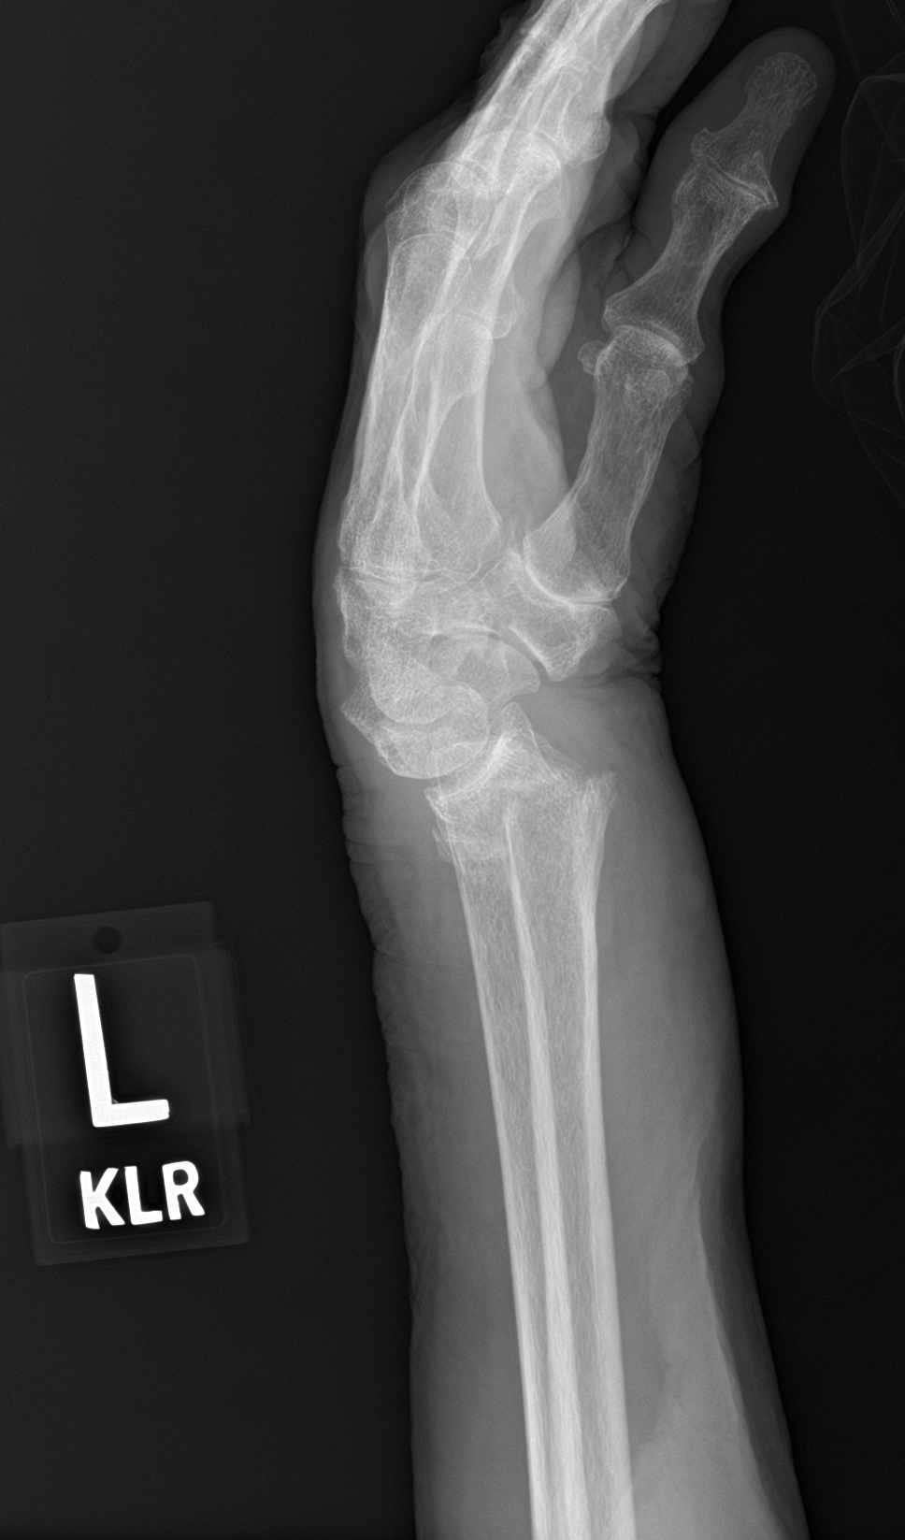

[navicular]
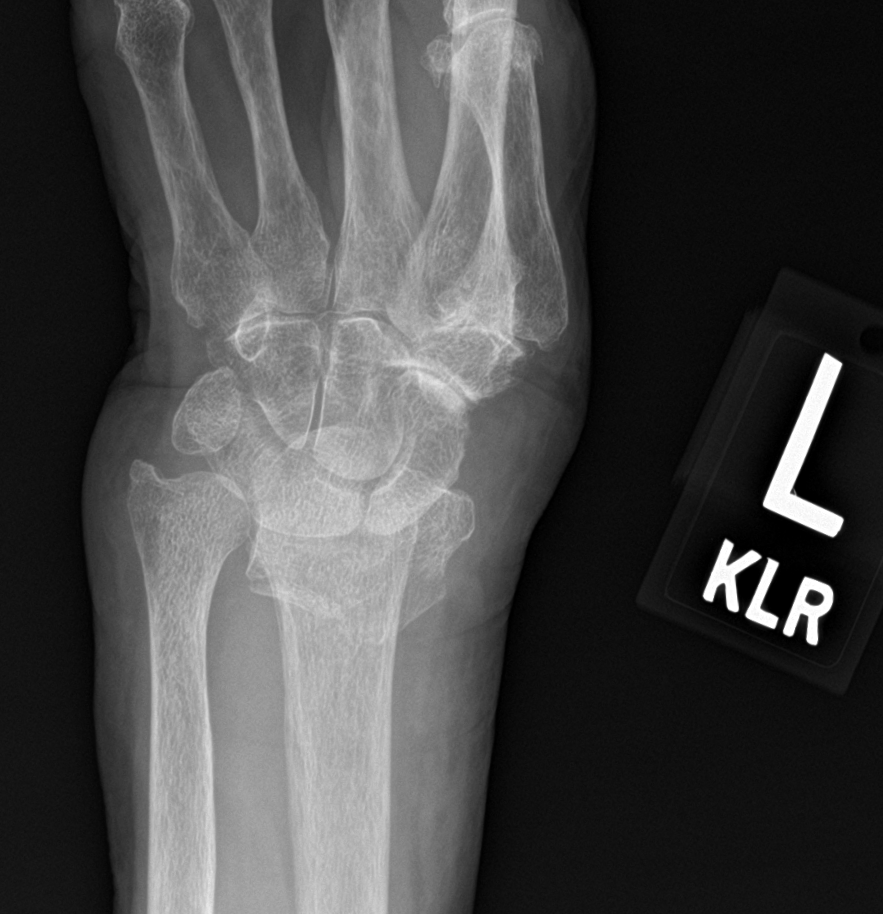

[4 of 4 positions shown; findings below may reference images not displayed]

FINDINGS: Comminuted distal radial fracture is noted with impaction and
posterior displacement of the distal fracture fragments by
approximately [DATE] bone width. No other fracture is identified. Soft
tissue changes are noted. Degenerative changes at the first CMC
joint are seen.
IMPRESSION: Distal radial fracture with impaction and posterior displacement.

## 2020-05-02 IMAGING — CR DG FEMUR 2+V*L*
3 series · 3 of 3 positions shown · non-contrast
Comparison: 12/26/2017.

CLINICAL DATA: Intraoperative imaging for left proximal femur
fracture reduction.

EXAM:
DG C-ARM 61-120 MIN; LEFT FEMUR 2 VIEWS

[cont. (1 of 3)]
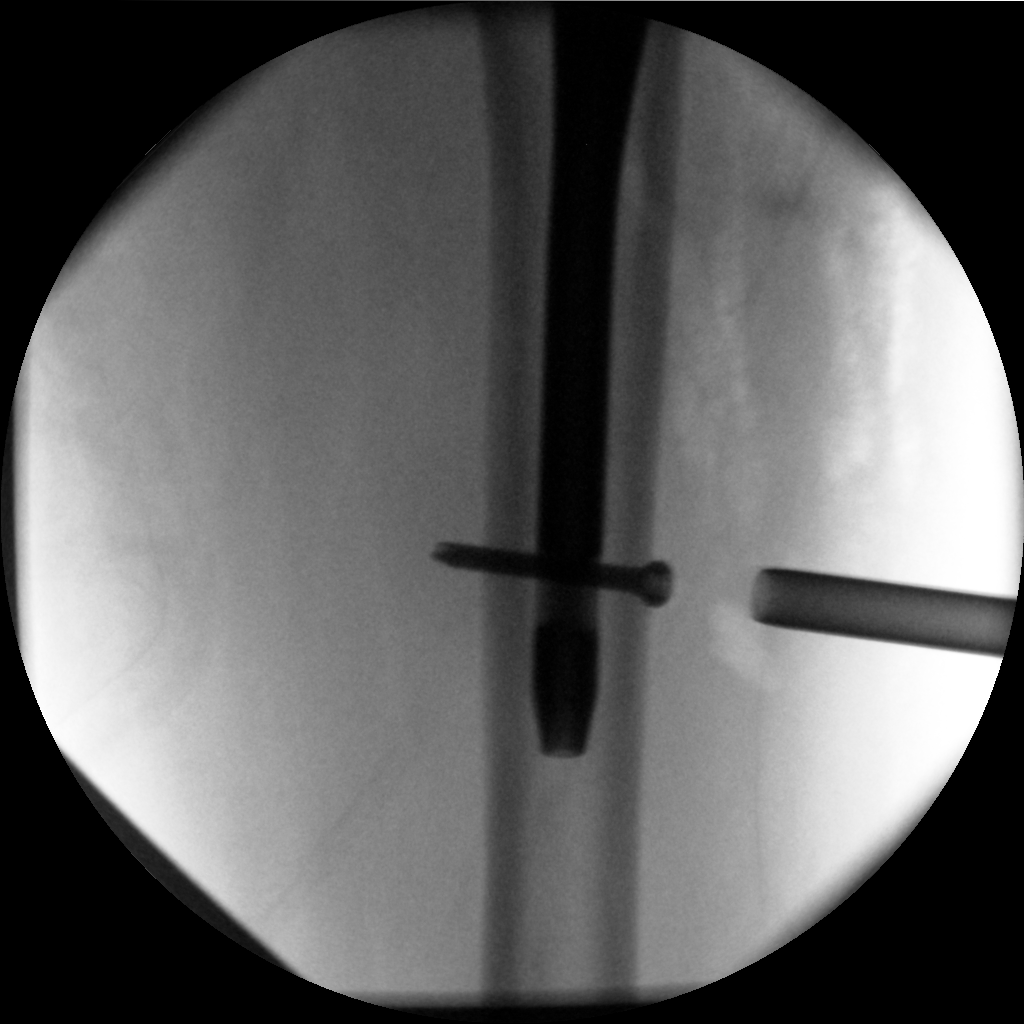

[cont. (2 of 3)]
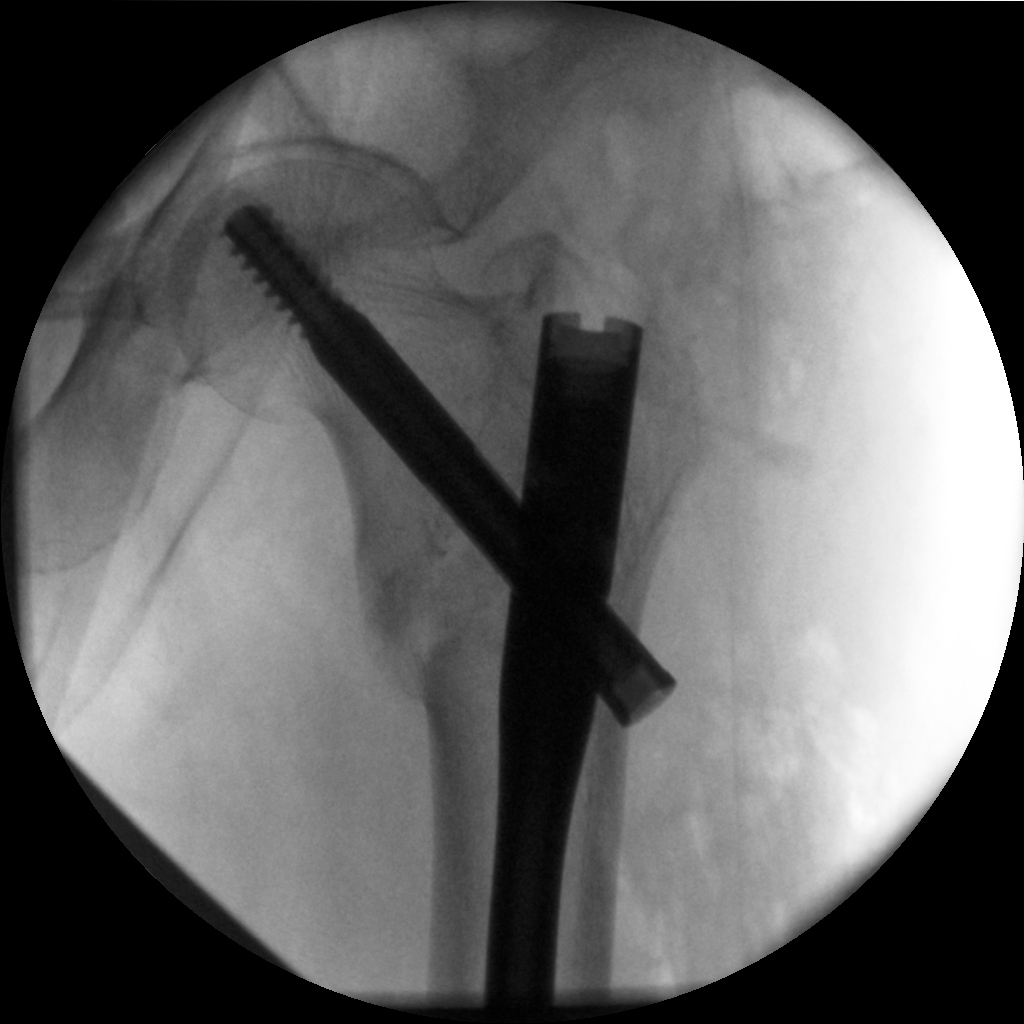

[cont. (3 of 3)]
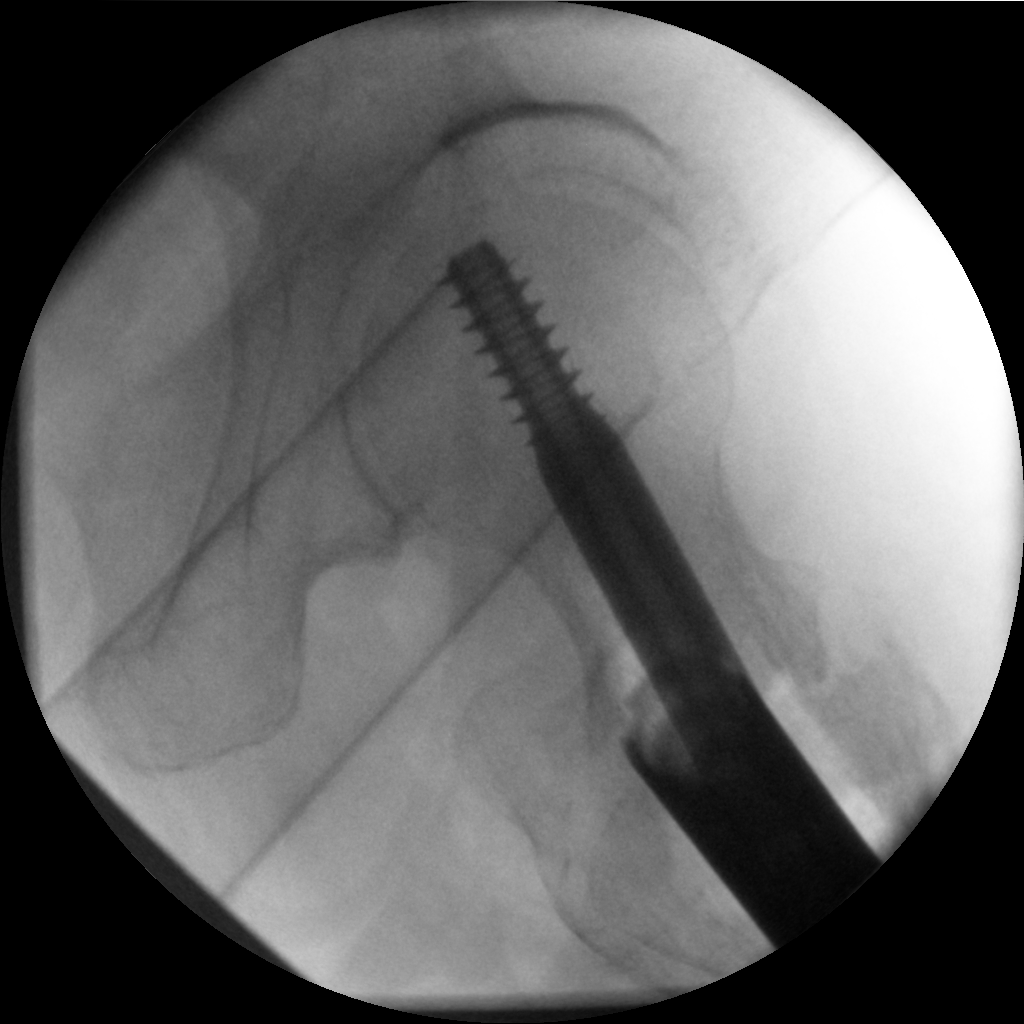

[3 of 3 positions shown; findings below may reference images not displayed]

FINDINGS: Portable imaging shows placement of a compression screw and
intramedullary rod reducing the intertrochanteric proximal femur
fracture components into near anatomic alignment. The orthopedic
hardware is well-seated. No evidence acute fracture or operative
complication.
IMPRESSION: 1. Successful reduction of the intertrochanteric left proximal femur
fracture following ORIF

## 2024-04-22 NOTE — Telephone Encounter (Signed)
 open in error
